# Patient Record
Sex: Female | Born: 1995 | Race: Black or African American | Hispanic: No | Marital: Single | Smoking: Former smoker
Health system: Southern US, Community
[De-identification: ages and names within clinical notes are randomized; demographics above are authoritative.]

## PROBLEM LIST (undated history)

## (undated) ENCOUNTER — Inpatient Hospital Stay (HOSPITAL_COMMUNITY): Payer: Self-pay

## (undated) DIAGNOSIS — U071 COVID-19: Secondary | ICD-10-CM

## (undated) DIAGNOSIS — D649 Anemia, unspecified: Secondary | ICD-10-CM

## (undated) DIAGNOSIS — O0990 Supervision of high risk pregnancy, unspecified, unspecified trimester: Secondary | ICD-10-CM

## (undated) DIAGNOSIS — O34219 Maternal care for unspecified type scar from previous cesarean delivery: Secondary | ICD-10-CM

## (undated) DIAGNOSIS — R0600 Dyspnea, unspecified: Secondary | ICD-10-CM

## (undated) DIAGNOSIS — O30049 Twin pregnancy, dichorionic/diamniotic, unspecified trimester: Secondary | ICD-10-CM

## (undated) DIAGNOSIS — I959 Hypotension, unspecified: Secondary | ICD-10-CM

## (undated) HISTORY — PX: OTHER SURGICAL HISTORY: SHX169

## (undated) HISTORY — DX: Dyspnea, unspecified: R06.00

---
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
## 2015-05-10 ENCOUNTER — Ambulatory Visit (HOSPITAL_COMMUNITY)
Admission: EM | Admit: 2015-05-10 | Discharge: 2015-05-10 | Disposition: A | Discharge status: Home / Self Care | Payer: Medicaid Other | Attending: Emergency Medicine | Admitting: Emergency Medicine

## 2015-05-10 DIAGNOSIS — L292 Pruritus vulvae: Secondary | ICD-10-CM | POA: Insufficient documentation

## 2015-05-10 DIAGNOSIS — F17210 Nicotine dependence, cigarettes, uncomplicated: Secondary | ICD-10-CM | POA: Insufficient documentation

## 2015-05-10 DIAGNOSIS — Z79899 Other long term (current) drug therapy: Secondary | ICD-10-CM | POA: Diagnosis not present

## 2015-05-10 DIAGNOSIS — Z113 Encounter for screening for infections with a predominantly sexual mode of transmission: Secondary | ICD-10-CM

## 2015-05-10 DIAGNOSIS — N898 Other specified noninflammatory disorders of vagina: Secondary | ICD-10-CM

## 2015-05-10 DIAGNOSIS — Z9889 Other specified postprocedural states: Secondary | ICD-10-CM | POA: Insufficient documentation

## 2015-05-10 DIAGNOSIS — L298 Other pruritus: Secondary | ICD-10-CM

## 2015-05-10 LAB — POCT URINALYSIS DIP (DEVICE)
BILIRUBIN URINE: NEGATIVE
Glucose, UA: NEGATIVE mg/dL
Hgb urine dipstick: NEGATIVE
KETONES UR: NEGATIVE mg/dL
Leukocytes, UA: NEGATIVE
Nitrite: NEGATIVE
PH: 6 (ref 5.0–8.0)
Protein, ur: NEGATIVE mg/dL
Specific Gravity, Urine: 1.025 (ref 1.005–1.030)
Urobilinogen, UA: 0.2 mg/dL (ref 0.0–1.0)

## 2015-05-10 LAB — POCT PREGNANCY, URINE: PREG TEST UR: NEGATIVE

## 2015-05-10 MED ORDER — FLUCONAZOLE 150 MG PO TABS: 150.0000 mg | ORAL_TABLET | Freq: Once | ORAL | Status: DC

## 2015-05-10 NOTE — ED Notes (Signed)
Pt recently had unprotected sex with a past sex partner and she has had symptoms in the past and thinks that she has some of the same Pt alert and oriented

## 2015-05-10 NOTE — Discharge Instructions (Signed)
It looks like you have a yeast infection. Take Diflucan one pill today. If you are still having itching in 3 days, take the second pill. We have collected testing for STDs. We will call you with your results in 2-3 days. Follow-up as needed.

## 2015-05-10 NOTE — ED Provider Notes (Signed)
CSN: [id number]     Arrival date & time 05/10/15  1523 History   First MD Initiated Contact with Patient 05/10/15 1635     Chief Complaint  Patient presents with  . Vaginal Itching    and odor   (Consider location/radiation/quality/duration/timing/severity/associated sxs/prior Treatment) HPI  She is a 20 year old woman here for evaluation of vaginal itching. She states over the last few days she has noticed a change in her vaginal odor as well as some mild itching. She also reports some very slight abdominal pain. No urinary symptoms. She does report unprotected sex with a previous partner recently. She did have gonorrhea last year and states her symptoms are similar, but much more mild.  No past medical history on file. Past Surgical History  Procedure Laterality Date  . Cesarean section     No family history on file. Social History  Substance Use Topics  . Smoking status: Current Every Day Smoker    Types: Cigarettes  . Smokeless tobacco: Not on file  . Alcohol Use: No   OB History    No data available     Review of Systems As in history of present illness Allergies  Review of patient's allergies indicates no known allergies.  Home Medications   Prior to Admission medications   Medication Sig Start Date End Date Taking? Authorizing Provider  fluconazole (DIFLUCAN) 150 MG tablet Take 1 tablet (150 mg total) by mouth once. Take 2nd pill if symptoms not completely resolved in 3 days. 05/10/15   Charm RingsErin J Jaicee Michelotti, MD  ibuprofen (ADVIL,MOTRIN) 600 MG tablet Take 1 tablet (600 mg total) by mouth every 8 (eight) hours as needed. 12/03/14   Shon Batonourtney F Horton, MD  ondansetron (ZOFRAN ODT) 8 MG disintegrating tablet Take 1 tablet (8 mg total) by mouth every 8 (eight) hours as needed for nausea or vomiting. 12/03/14   Shon Batonourtney F Horton, MD   Meds Ordered and Administered this Visit  Medications - No data to display  BP 115/53 mmHg  Pulse 61  Temp(Src) 99.3 F (37.4 C) (Oral)  Resp 16   SpO2 100%  LMP 04/21/2015 (Exact Date) No data found.   Physical Exam  Constitutional: She is oriented to person, place, and time. She appears well-developed and well-nourished. No distress.  Cardiovascular: Normal rate.   Pulmonary/Chest: Effort normal.  Genitourinary: There is no rash on the right labia. There is no rash on the left labia. Cervix exhibits no motion tenderness, no discharge and no friability. No bleeding in the vagina. No foreign body around the vagina. Vaginal discharge (clumpy white) found.  Neurological: She is alert and oriented to person, place, and time.    ED Course  Procedures (including critical care time)  Labs Review Labs Reviewed  POCT URINALYSIS DIP (DEVICE)  POCT PREGNANCY, URINE  CERVICOVAGINAL ANCILLARY ONLY    Imaging Review No results found.    MDM   1. Vaginal itching   2. Screen for STD (sexually transmitted disease)    We'll treat for yeast infection. Swabs collected for STD testing. Will treat based on results. Follow-up as needed.    Charm RingsErin J Deaven Urwin, MD 05/10/15 1710

## 2015-05-11 LAB — CERVICOVAGINAL ANCILLARY ONLY
Chlamydia: NEGATIVE
NEISSERIA GONORRHEA: POSITIVE — AB
Wet Prep (BD Affirm): POSITIVE — AB

## 2015-05-12 ENCOUNTER — Ambulatory Visit (HOSPITAL_COMMUNITY)
Admission: EM | Admit: 2015-05-12 | Discharge: 2015-05-12 | Disposition: A | Discharge status: Nursing Home (Includes ICF) | Payer: Medicaid Other

## 2015-05-12 ENCOUNTER — Telehealth (HOSPITAL_COMMUNITY): Payer: Self-pay | Admitting: Emergency Medicine

## 2015-05-12 NOTE — ED Notes (Addendum)
Called pt and notified of recent lab results from visit 4/10 Pt ID'd properly... [facility]  Per Dr. Dayton ScrapeMurray,  Clinical staff, please let patient and health department know that test for gonorrhea was positive. Will need tx with 250mg  IM rocephin and 1g po zithromax. Test for gardnerella (bacterial vaginosis) was also positive; can treat this with metronidazole 500mg  bid x 7d, #14 no refills. LM    Adv her that her partner needs to be tested and treated as well.  Pt verb understanding Education on safe sex given

## 2015-05-12 NOTE — ED Notes (Signed)
Pt  Told   Clerical  Saff  At  1415   That  She  Could  Wait  No  Longer    And   She  walked  Out the  door

## 2015-05-13 ENCOUNTER — Ambulatory Visit (HOSPITAL_COMMUNITY)
Admission: EM | Admit: 2015-05-13 | Discharge: 2015-05-13 | Disposition: A | Discharge status: Home / Self Care | Payer: Medicaid Other | Attending: Family Medicine | Admitting: Family Medicine

## 2015-05-13 ENCOUNTER — Encounter (HOSPITAL_COMMUNITY): Payer: Self-pay | Admitting: *Deleted

## 2015-05-13 DIAGNOSIS — A64 Unspecified sexually transmitted disease: Secondary | ICD-10-CM

## 2015-05-13 MED ORDER — CEFTRIAXONE SODIUM 250 MG IJ SOLR
INTRAMUSCULAR | Status: AC
  Filled 2015-05-13: qty 250

## 2015-05-13 MED ORDER — CEFTRIAXONE SODIUM 250 MG IJ SOLR
250.0000 mg | Freq: Once | INTRAMUSCULAR | Status: AC
  Administered 2015-05-13: 250 mg via INTRAMUSCULAR

## 2015-05-13 MED ORDER — AZITHROMYCIN 250 MG PO TABS
1000.0000 mg | ORAL_TABLET | Freq: Once | ORAL | Status: AC
  Administered 2015-05-13: 1000 mg via ORAL

## 2015-05-13 MED ORDER — METRONIDAZOLE 500 MG PO TABS: 500.0000 mg | ORAL_TABLET | Freq: Two times a day (BID) | ORAL | Status: DC

## 2015-05-13 NOTE — ED Notes (Addendum)
Pt  Was   Seen       3  Days  ago  For  Std  Pt  Here  Now  For  tx       She  Was  Given   Yeast  meds          And  Was  Told had  Poss  gc  And  gardenella    Pt  Here  For possible injection  Of   rocephen     z max   And  Diflucan

## 2015-05-13 NOTE — ED Provider Notes (Addendum)
CSN: [id number]     Arrival date & time 05/13/15  1419 History   First MD Initiated Contact with Sarah Schmitt 05/13/15 1550     Chief Complaint  Sarah Schmitt presents with  . SEXUALLY TRANSMITTED DISEASE   (Consider location/radiation/quality/duration/timing/severity/associated sxs/prior Treatment) Sarah Schmitt is a 20 y.o. female presenting with STD exposure. The history is provided by the Sarah Schmitt.  Exposure to STD This is a new problem. The current episode started more than 2 days ago (seen 4/10 and pos for gc and bv, here for rx, pt denies any sx.). The problem has not changed since onset.Pertinent negatives include no abdominal pain.    History reviewed. No pertinent past medical history. Past Surgical History  Procedure Laterality Date  . Cesarean section     History reviewed. No pertinent family history. Social History  Substance Use Topics  . Smoking status: Current Every Day Smoker    Types: Cigarettes  . Smokeless tobacco: None  . Alcohol Use: No   OB History    No data available     Review of Systems  Gastrointestinal: Negative for abdominal pain.  Genitourinary: Negative.   All other systems reviewed and are negative.   Allergies  Review of Sarah Schmitt's allergies indicates no known allergies.  Home Medications   Prior to Admission medications   Medication Sig Start Date End Date Taking? Authorizing Provider  fluconazole (DIFLUCAN) 150 MG tablet Take 1 tablet (150 mg total) by mouth once. Take 2nd pill if symptoms not completely resolved in 3 days. 05/10/15   Charm Rings, MD  ibuprofen (ADVIL,MOTRIN) 600 MG tablet Take 1 tablet (600 mg total) by mouth every 8 (eight) hours as needed. 12/03/14   Shon Baton, MD  metroNIDAZOLE (FLAGYL) 500 MG tablet Take 1 tablet (500 mg total) by mouth 2 (two) times daily. 05/13/15   Linna Hoff, MD  ondansetron (ZOFRAN ODT) 8 MG disintegrating tablet Take 1 tablet (8 mg total) by mouth every 8 (eight) hours as needed for nausea or  vomiting. 12/03/14   Shon Baton, MD   Meds Ordered and Administered this Visit   Medications  cefTRIAXone (ROCEPHIN) injection 250 mg (not administered)  azithromycin (ZITHROMAX) tablet 1,000 mg (not administered)    BP 104/68 mmHg  Pulse 60  Temp(Src) 97.7 F (36.5 C) (Oral)  Resp 16  SpO2 100%  LMP 05/13/2015 No data found.   Physical Exam  Constitutional: She is oriented to person, place, and time. She appears well-developed and well-nourished. No distress.  Abdominal: There is no tenderness.  Neurological: She is alert and oriented to person, place, and time.  Skin: Skin is warm and dry.  Nursing note and vitals reviewed.   ED Course  Procedures (including critical care time)  Labs Review Labs Reviewed - No data to display  Imaging Review No results found.   Visual Acuity Review  Right Eye Distance:   Left Eye Distance:   Bilateral Distance:    Right Eye Near:   Left Eye Near:    Bilateral Near:         MDM   1. STD (sexually transmitted disease)    Meds ordered this encounter  Medications  . cefTRIAXone (ROCEPHIN) injection 250 mg    Sig:   . azithromycin (ZITHROMAX) tablet 1,000 mg    Sig:   . metroNIDAZOLE (FLAGYL) 500 MG tablet    Sig: Take 1 tablet (500 mg total) by mouth 2 (two) times daily.    Dispense:  14 tablet  Refill:  0       Linna HoffJames D Kindl, MD 05/13/15 1615  Linna HoffJames D Kindl, MD 05/13/15 [phone]

## 2015-05-13 NOTE — ED Notes (Signed)
Called pt and notified her that it's important for her to come in and get seen ASAP... Adv her that [facility] will be notified as well Reports she left yest b/c she had to go to work and will be in this afternoon [facility] is [facility].

## 2015-05-17 NOTE — ED Notes (Signed)
Pt came in to get treated on 4/13

## 2016-01-31 HISTORY — DX: Maternal care for unspecified type scar from previous cesarean delivery: O34.219

## 2016-03-08 ENCOUNTER — Encounter (HOSPITAL_COMMUNITY): Payer: Self-pay | Admitting: *Deleted

## 2016-03-08 ENCOUNTER — Emergency Department (HOSPITAL_COMMUNITY): Payer: Medicaid Other

## 2016-03-08 ENCOUNTER — Emergency Department (HOSPITAL_COMMUNITY)
Admission: EM | Admit: 2016-03-08 | Discharge: 2016-03-08 | Disposition: A | Discharge status: Home / Self Care | Payer: Medicaid Other | Attending: Emergency Medicine | Admitting: Emergency Medicine

## 2016-03-08 DIAGNOSIS — Z3A15 15 weeks gestation of pregnancy: Secondary | ICD-10-CM

## 2016-03-08 DIAGNOSIS — O99332 Smoking (tobacco) complicating pregnancy, second trimester: Secondary | ICD-10-CM | POA: Diagnosis not present

## 2016-03-08 DIAGNOSIS — O209 Hemorrhage in early pregnancy, unspecified: Secondary | ICD-10-CM | POA: Insufficient documentation

## 2016-03-08 DIAGNOSIS — R109 Unspecified abdominal pain: Secondary | ICD-10-CM | POA: Diagnosis not present

## 2016-03-08 DIAGNOSIS — O9A212 Injury, poisoning and certain other consequences of external causes complicating pregnancy, second trimester: Secondary | ICD-10-CM | POA: Insufficient documentation

## 2016-03-08 DIAGNOSIS — F17210 Nicotine dependence, cigarettes, uncomplicated: Secondary | ICD-10-CM | POA: Insufficient documentation

## 2016-03-08 DIAGNOSIS — S3991XA Unspecified injury of abdomen, initial encounter: Secondary | ICD-10-CM

## 2016-03-08 LAB — CBC WITH DIFFERENTIAL/PLATELET
Basophils Absolute: 0 10*3/uL (ref 0.0–0.1)
Basophils Relative: 0 %
EOS PCT: 1 %
Eosinophils Absolute: 0.1 10*3/uL (ref 0.0–0.7)
HEMATOCRIT: 31.9 % — AB (ref 36.0–46.0)
HEMOGLOBIN: 10.7 g/dL — AB (ref 12.0–15.0)
LYMPHS ABS: 3.9 10*3/uL (ref 0.7–4.0)
LYMPHS PCT: 44 %
MCH: 29.4 pg (ref 26.0–34.0)
MCHC: 33.5 g/dL (ref 30.0–36.0)
MCV: 87.6 fL (ref 78.0–100.0)
Monocytes Absolute: 0.5 10*3/uL (ref 0.1–1.0)
Monocytes Relative: 6 %
NEUTROS ABS: 4.4 10*3/uL (ref 1.7–7.7)
Neutrophils Relative %: 49 %
Platelets: 234 10*3/uL (ref 150–400)
RBC: 3.64 MIL/uL — ABNORMAL LOW (ref 3.87–5.11)
RDW: 14.7 % (ref 11.5–15.5)
WBC: 9 10*3/uL (ref 4.0–10.5)

## 2016-03-08 LAB — URINALYSIS, ROUTINE W REFLEX MICROSCOPIC
Bilirubin Urine: NEGATIVE
GLUCOSE, UA: NEGATIVE mg/dL
HGB URINE DIPSTICK: NEGATIVE
Ketones, ur: NEGATIVE mg/dL
LEUKOCYTES UA: NEGATIVE
Nitrite: NEGATIVE
Protein, ur: NEGATIVE mg/dL
SPECIFIC GRAVITY, URINE: 1.024 (ref 1.005–1.030)
pH: 6 (ref 5.0–8.0)

## 2016-03-08 LAB — BASIC METABOLIC PANEL
Anion gap: 9 (ref 5–15)
BUN: 6 mg/dL (ref 6–20)
CHLORIDE: 102 mmol/L (ref 101–111)
CO2: 22 mmol/L (ref 22–32)
Calcium: 9 mg/dL (ref 8.9–10.3)
Creatinine, Ser: 0.47 mg/dL (ref 0.44–1.00)
GFR calc Af Amer: >60 mL/min (ref >60)
GFR calc non Af Amer: >60 mL/min (ref >60)
GLUCOSE: 84 mg/dL (ref 65–99)
Potassium: 3.5 mmol/L (ref 3.5–5.1)
Sodium: 133 mmol/L — ABNORMAL LOW (ref 135–145)

## 2016-03-08 LAB — POC URINE PREG, ED: Preg Test, Ur: POSITIVE — AB
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
## 2016-03-08 NOTE — Discharge Instructions (Signed)
Please follow-up with her OB/GYN for further management of your pregnancy and for recheck of your abdominal injury. If you been having any worsening vaginal bleeding, or changes in symptoms, please return to the nearest emergency department.
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
## 2016-03-10 LAB — URINE CULTURE

## 2016-03-11 ENCOUNTER — Telehealth: Payer: Self-pay

## 2016-03-11 NOTE — Telephone Encounter (Addendum)
Post ED Visit - Positive Culture Follow-up: Successful Patient Follow-Up  Culture assessed and recommendations reviewed by: []  Enzo BiNathan Batchelder, Pharm.D. []  Celedonio MiyamotoJeremy Frens, Pharm.D., BCPS []  Garvin FilaMike Maccia, Pharm.D. []  Georgina PillionElizabeth Martin, [address]Ph[address] BCPS []  Leisure KnollMinh Pham, [address]Ph[address]D., BCPS, AAHIVP []  Estella HuskMichelle Turner, Pharm.D., BCPS, AAHIVP []  Tennis Mustassie Stewart, Pharm.D. []  Sherle Poeob Vincent, [address] Rachel Rumbarger Pharm D Positive urine culture  [x]  Patient discharged without antimicrobial prescription and treatment is now indicated []  Organism is resistant to prescribed ED discharge antimicrobial []  Patient with positive blood cultures  Changes discussed with ED provider: Fayrene HelperBowie Tran [facility]  New antibiotic prescription Amoxicillin 500 mg PO BID x 7 days [facility] [address] [phone]  Contacted patient, date 03/11/16 time 1151   CullomLinnell Fulling, Annmarie Plemmons Burnett 03/11/2016, 11:48 AM

## 2016-03-11 NOTE — Progress Notes (Signed)
ED Antimicrobial Stewardship Positive Culture Follow Up   Sarah Schmitt is an 21 y.o. female who [facility] with a chief complaint of  Chief Complaint  Patient presents with  . Abdominal Pain    Recent Results (from the past 720 hour(s))  Urine culture     Status: Abnormal   Collection Time: 03/08/16  7:36 PM  Result Value Ref Range Status   Specimen Description URINE, CLEAN CATCH  Final   Special Requests NONE  Final   Culture >=100,000 COLONIES/mL ENTEROCOCCUS FAECALIS (A)  Final   Report Status 03/10/2016 FINAL  Final   Organism ID, Bacteria ENTEROCOCCUS FAECALIS (A)  Final      Susceptibility   Enterococcus faecalis - MIC*    AMPICILLIN <=2 SENSITIVE Sensitive     LEVOFLOXACIN 1 SENSITIVE Sensitive     NITROFURANTOIN <=16 SENSITIVE Sensitive     VANCOMYCIN 1 SENSITIVE Sensitive     * >=100,000 COLONIES/mL ENTEROCOCCUS FAECALIS    []  Treated with N/A, organism resistant to prescribed antimicrobial [x]  Patient discharged originally without antimicrobial agent and treatment is now indicated  New antibiotic prescription: amoxicillin 500mg  PO BID x 7 days  ED Provider: Fayrene HelperBowie Tran   Izreal Kock, Drake Leachachel Lynn 03/11/2016, 10:38 AM Clinical Pharmacist Phone# [phone]

## 2016-03-14 ENCOUNTER — Encounter (HOSPITAL_COMMUNITY): Payer: Self-pay | Admitting: *Deleted

## 2016-03-14 ENCOUNTER — Inpatient Hospital Stay (HOSPITAL_COMMUNITY)
Admission: AD | Admit: 2016-03-14 | Discharge: 2016-03-14 | Disposition: A | Discharge status: Home / Self Care | Payer: Medicaid Other | Source: Ambulatory Visit | Attending: Obstetrics & Gynecology | Admitting: Obstetrics & Gynecology

## 2016-03-14 DIAGNOSIS — N930 Postcoital and contact bleeding: Secondary | ICD-10-CM

## 2016-03-14 DIAGNOSIS — O34219 Maternal care for unspecified type scar from previous cesarean delivery: Secondary | ICD-10-CM

## 2016-03-14 DIAGNOSIS — O26852 Spotting complicating pregnancy, second trimester: Secondary | ICD-10-CM | POA: Diagnosis not present

## 2016-03-14 DIAGNOSIS — Z87891 Personal history of nicotine dependence: Secondary | ICD-10-CM | POA: Insufficient documentation

## 2016-03-14 DIAGNOSIS — R109 Unspecified abdominal pain: Secondary | ICD-10-CM | POA: Diagnosis present

## 2016-03-14 DIAGNOSIS — Z3A16 16 weeks gestation of pregnancy: Secondary | ICD-10-CM | POA: Insufficient documentation

## 2016-03-14 DIAGNOSIS — O4692 Antepartum hemorrhage, unspecified, second trimester: Secondary | ICD-10-CM | POA: Diagnosis not present

## 2016-03-14 LAB — URINALYSIS, ROUTINE W REFLEX MICROSCOPIC
BILIRUBIN URINE: NEGATIVE
GLUCOSE, UA: NEGATIVE mg/dL
Hgb urine dipstick: NEGATIVE
KETONES UR: NEGATIVE mg/dL
LEUKOCYTES UA: NEGATIVE
NITRITE: NEGATIVE
PH: 6 (ref 5.0–8.0)
Protein, ur: NEGATIVE mg/dL
SPECIFIC GRAVITY, URINE: 1.021 (ref 1.005–1.030)

## 2016-03-14 LAB — WET PREP, GENITAL
CLUE CELLS WET PREP: NONE SEEN
SPERM: NONE SEEN
TRICH WET PREP: NONE SEEN
WBC, Wet Prep HPF POC: NONE SEEN
Yeast Wet Prep HPF POC: NONE SEEN

## 2016-03-14 NOTE — Progress Notes (Signed)
Marie Williams CNM in to discuss test results and d/c plan. Written and verbal d/c instructions given and understanding voiced.  
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
## 2016-03-14 NOTE — MAU Note (Signed)
Have had cramping my whole pregnancy. Spotting  last wk and [facility]. Told me everything ok but if cont to be seen. Having a lot of pressure on c/s incision for a wk. Spotting was bright red this am and is the second time of spotting so that is why I came in

## 2016-03-14 NOTE — Discharge Instructions (Signed)
Second Trimester of Pregnancy The second trimester is from week 13 through week 28, month 4 through 6. This is often the time in pregnancy that you feel your best. Often times, morning sickness has lessened or quit. You may have more energy, and you may get hungry more often. Your unborn baby (fetus) is growing rapidly. At the end of the sixth month, he or she is about 9 inches long and weighs about 1 pounds. You will likely feel the baby move (quickening) between 18 and 20 weeks of pregnancy. Follow these instructions at home:  Avoid all smoking, herbs, and alcohol. Avoid drugs not approved by your doctor.  Do not use any tobacco products, including cigarettes, chewing tobacco, and electronic cigarettes. If you need help quitting, ask your doctor. You may get counseling or other support to help you quit.  Only take medicine as told by your doctor. Some medicines are safe and some are not during pregnancy.  Exercise only as told by your doctor. Stop exercising if you start having cramps.  Eat regular, healthy meals.  Wear a good support bra if your breasts are tender.  Do not use hot tubs, steam rooms, or saunas.  Wear your seat belt when driving.  Avoid raw meat, uncooked cheese, and liter boxes and soil used by cats.  Take your prenatal vitamins.  Take 1500-2000 milligrams of calcium daily starting at the 20th week of pregnancy until you deliver your baby.  Try taking medicine that helps you poop (stool softener) as needed, and if your doctor approves. Eat more fiber by eating fresh fruit, vegetables, and whole grains. Drink enough fluids to keep your pee (urine) clear or pale yellow.  Take warm water baths (sitz baths) to soothe pain or discomfort caused by hemorrhoids. Use hemorrhoid cream if your doctor approves.  If you have puffy, bulging veins (varicose veins), wear support hose. Raise (elevate) your feet for 15 minutes, 3-4 times a day. Limit salt in your diet.  Avoid heavy  lifting, wear low heals, and sit up straight.  Rest with your legs raised if you have leg cramps or low back pain.  Visit your dentist if you have not gone during your pregnancy. Use a soft toothbrush to brush your teeth. Be gentle when you floss.  You can have sex (intercourse) unless your doctor tells you not to.  Go to your doctor visits. Get help if:  You feel dizzy.  You have mild cramps or pressure in your lower belly (abdomen).  You have a nagging pain in your belly area.  You continue to feel sick to your stomach (nauseous), throw up (vomit), or have watery poop (diarrhea).  You have bad smelling fluid coming from your vagina.  You have pain with peeing (urination). Get help right away if:  You have a fever.  You are leaking fluid from your vagina.  You have spotting or bleeding from your vagina.  You have severe belly cramping or pain.  You lose or gain weight rapidly.  You have trouble catching your breath and have chest pain.  You notice sudden or extreme puffiness (swelling) of your face, hands, ankles, feet, or legs.  You have not felt the baby move in over an hour.  You have severe headaches that do not go away with medicine.  You have vision changes. This information is not intended to replace advice given to you by your health care provider. Make sure you discuss any questions you have with your health care  provider. °Document Released: 04/12/2009 Document Revised: 06/24/2015 Document Reviewed: 03/19/2012 °Elsevier Interactive Patient Education © 2017 Elsevier Inc. ° °Prenatal Care Providers °[facility]    [facility] ° Phone- [phone]     Phone: [phone] °         °[facility]                      [facility] ° @[place]     Phone: [phone] ° Phone: [phone] °        [facility] °[facility]     Phone: [phone] ° Phone:  [phone]   °        [facility] °[facility] [place]                hone: [phone] ° Phone: [phone] °        [facility] °Dr. Bernard Marshall      Phone: [phone] ° Phone: [phone] °        [facility] °[facility]                Phone: [phone] ° Women’s Health  ° Phone:[phone]    [facility] °         Phone: [phone] °[facility] °  Phone: [phone] ° °

## 2016-03-15 LAB — GC/CHLAMYDIA PROBE AMP (~~LOC~~) NOT AT ARMC
CHLAMYDIA, DNA PROBE: NEGATIVE
NEISSERIA GONORRHEA: NEGATIVE

## 2016-03-23 ENCOUNTER — Inpatient Hospital Stay (HOSPITAL_COMMUNITY)
Admission: AD | Admit: 2016-03-23 | Discharge: 2016-03-23 | Disposition: A | Discharge status: Home / Self Care | Payer: Medicaid Other | Source: Ambulatory Visit | Attending: Obstetrics and Gynecology | Admitting: Obstetrics and Gynecology

## 2016-03-23 ENCOUNTER — Encounter (HOSPITAL_COMMUNITY): Payer: Self-pay | Admitting: *Deleted

## 2016-03-23 DIAGNOSIS — Z87891 Personal history of nicotine dependence: Secondary | ICD-10-CM | POA: Insufficient documentation

## 2016-03-23 DIAGNOSIS — Z3A17 17 weeks gestation of pregnancy: Secondary | ICD-10-CM | POA: Insufficient documentation

## 2016-03-23 DIAGNOSIS — Y92009 Unspecified place in unspecified non-institutional (private) residence as the place of occurrence of the external cause: Secondary | ICD-10-CM

## 2016-03-23 DIAGNOSIS — O34219 Maternal care for unspecified type scar from previous cesarean delivery: Secondary | ICD-10-CM

## 2016-03-23 DIAGNOSIS — O4692 Antepartum hemorrhage, unspecified, second trimester: Secondary | ICD-10-CM

## 2016-03-23 DIAGNOSIS — O26892 Other specified pregnancy related conditions, second trimester: Secondary | ICD-10-CM | POA: Insufficient documentation

## 2016-03-23 DIAGNOSIS — O9989 Other specified diseases and conditions complicating pregnancy, childbirth and the puerperium: Secondary | ICD-10-CM

## 2016-03-23 MED ORDER — PREPLUS 27-1 MG PO TABS: 1.0000 | ORAL_TABLET | Freq: Every day | ORAL | 13 refills | Status: DC
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
## 2016-04-03 ENCOUNTER — Encounter: Payer: Self-pay | Admitting: Obstetrics

## 2016-04-03 ENCOUNTER — Ambulatory Visit (INDEPENDENT_AMBULATORY_CARE_PROVIDER_SITE_OTHER): Payer: Medicaid Other | Admitting: Obstetrics

## 2016-04-03 VITALS — BP 123/74 | HR 77 | Wt 191.0 lb

## 2016-04-03 DIAGNOSIS — O0932 Supervision of pregnancy with insufficient antenatal care, second trimester: Secondary | ICD-10-CM

## 2016-04-03 DIAGNOSIS — Z3402 Encounter for supervision of normal first pregnancy, second trimester: Secondary | ICD-10-CM | POA: Diagnosis not present

## 2016-04-03 DIAGNOSIS — Z3490 Encounter for supervision of normal pregnancy, unspecified, unspecified trimester: Secondary | ICD-10-CM | POA: Insufficient documentation

## 2016-04-03 DIAGNOSIS — O4692 Antepartum hemorrhage, unspecified, second trimester: Secondary | ICD-10-CM

## 2016-04-03 DIAGNOSIS — Z87440 Personal history of urinary (tract) infections: Secondary | ICD-10-CM

## 2016-04-03 DIAGNOSIS — O34219 Maternal care for unspecified type scar from previous cesarean delivery: Secondary | ICD-10-CM

## 2016-04-03 DIAGNOSIS — O0930 Supervision of pregnancy with insufficient antenatal care, unspecified trimester: Secondary | ICD-10-CM | POA: Insufficient documentation

## 2016-04-03 DIAGNOSIS — Z3480 Encounter for supervision of other normal pregnancy, unspecified trimester: Secondary | ICD-10-CM

## 2016-04-03 NOTE — Progress Notes (Signed)
Subjective:  Sarah Schmitt is a 21 y.o. G2P1001 at 3849w1d being seen today for ongoing prenatal care.  She is currently monitored for the following issues for this low-risk pregnancy and has History of cesarean delivery affecting pregnancy; Antepartum bleeding, second trimester; Encounter for supervision of normal pregnancy; and Late prenatal care on her problem list.  Patient reports urinary difficulty completely emptying bladder.  Contractions: Not present. Vag. Bleeding: None.  Movement: Absent. Denies leaking of fluid.   The following portions of the patient's history were reviewed and updated as appropriate: allergies, current medications, past family history, past medical history, past social history, past surgical history and problem list. Problem list updated.  Objective:   Vitals:   04/03/16 0957  BP: 123/74  Pulse: 77  Weight: 191 lb (86.6 kg)    Fetal Status:     Movement: Absent     General:  Alert, oriented and cooperative. Patient is in no acute distress.  Skin: Skin is warm and dry. No rash noted.   Cardiovascular: Normal heart rate noted  Respiratory: Normal respiratory effort, no problems with respiration noted  Abdomen: Soft, gravid, appropriate for gestational age. Pain/Pressure: Present     Pelvic:  Cervical exam deferred        Extremities: Normal range of motion.  Edema: None  Mental Status: Normal mood and affect. Normal behavior. Normal judgment and thought content.   Urinalysis:      Assessment and Plan:  Pregnancy: G2P1001 at 6149w1d  1. History of cesarean delivery affecting pregnancy - Desires TOLAC  2. Antepartum bleeding, second trimester Stable.  3. History of UTI Rx: - Culture, OB Urine  4. Encounter for supervision of normal pregnancy, antepartum, unspecified gravidity   5. Late prenatal care   Preterm labor symptoms and general obstetric precautions including but not limited to vaginal bleeding, contractions, leaking of fluid and  fetal movement were reviewed in detail with the patient. Please refer to After Visit Summary for other counseling recommendations.  No Follow-up on file.   Brock Badharles A Harper, MDPatient ID: Sarah Schmitt, female   DOB: 09/25/1995, 21 y.o.   MRN: [id number]

## 2016-04-03 NOTE — Progress Notes (Signed)
Pt presents for NOB visit. Pt [facility] [place] and records were requested. Hx UTI, Abx complete, TOC UTI today.

## 2016-04-09 ENCOUNTER — Other Ambulatory Visit: Payer: Self-pay | Admitting: Obstetrics

## 2016-04-09 DIAGNOSIS — N3000 Acute cystitis without hematuria: Secondary | ICD-10-CM

## 2016-04-09 LAB — URINE CULTURE, OB REFLEX

## 2016-04-09 LAB — CULTURE, OB URINE

## 2016-04-09 MED ORDER — CEFUROXIME AXETIL 500 MG PO TABS: 500.0000 mg | ORAL_TABLET | Freq: Two times a day (BID) | ORAL | 1 refills | Status: DC

## 2016-04-13 ENCOUNTER — Other Ambulatory Visit: Payer: Self-pay | Admitting: Obstetrics

## 2016-04-13 DIAGNOSIS — N3000 Acute cystitis without hematuria: Secondary | ICD-10-CM

## 2016-04-13 MED ORDER — CEFUROXIME AXETIL 500 MG PO TABS: 500.0000 mg | ORAL_TABLET | Freq: Two times a day (BID) | ORAL | 1 refills | Status: DC

## 2016-05-02 ENCOUNTER — Encounter: Payer: Medicaid Other | Admitting: Obstetrics

## 2016-05-15 ENCOUNTER — Encounter: Payer: Medicaid Other | Admitting: Obstetrics

## 2016-05-24 ENCOUNTER — Encounter: Payer: Medicaid Other | Admitting: Obstetrics

## 2016-05-26 ENCOUNTER — Encounter (HOSPITAL_COMMUNITY): Payer: Self-pay

## 2016-05-26 ENCOUNTER — Inpatient Hospital Stay (HOSPITAL_COMMUNITY)
Admission: AD | Admit: 2016-05-26 | Discharge: 2016-05-26 | Disposition: A | Discharge status: Home / Self Care | Payer: Medicaid Other | Source: Ambulatory Visit | Attending: Obstetrics and Gynecology | Admitting: Obstetrics and Gynecology

## 2016-05-26 DIAGNOSIS — N760 Acute vaginitis: Secondary | ICD-10-CM

## 2016-05-26 DIAGNOSIS — O26892 Other specified pregnancy related conditions, second trimester: Secondary | ICD-10-CM | POA: Diagnosis present

## 2016-05-26 DIAGNOSIS — O9989 Other specified diseases and conditions complicating pregnancy, childbirth and the puerperium: Secondary | ICD-10-CM

## 2016-05-26 DIAGNOSIS — R1030 Lower abdominal pain, unspecified: Secondary | ICD-10-CM | POA: Diagnosis present

## 2016-05-26 DIAGNOSIS — Z3A26 26 weeks gestation of pregnancy: Secondary | ICD-10-CM | POA: Diagnosis not present

## 2016-05-26 DIAGNOSIS — B9689 Other specified bacterial agents as the cause of diseases classified elsewhere: Secondary | ICD-10-CM

## 2016-05-26 DIAGNOSIS — K5901 Slow transit constipation: Secondary | ICD-10-CM

## 2016-05-26 DIAGNOSIS — Z87891 Personal history of nicotine dependence: Secondary | ICD-10-CM | POA: Insufficient documentation

## 2016-05-26 DIAGNOSIS — R109 Unspecified abdominal pain: Secondary | ICD-10-CM | POA: Diagnosis not present

## 2016-05-26 LAB — URINALYSIS, ROUTINE W REFLEX MICROSCOPIC
Bilirubin Urine: NEGATIVE
GLUCOSE, UA: NEGATIVE mg/dL
Hgb urine dipstick: NEGATIVE
Ketones, ur: NEGATIVE mg/dL
LEUKOCYTES UA: NEGATIVE
Nitrite: NEGATIVE
PROTEIN: NEGATIVE mg/dL
Specific Gravity, Urine: 1.017 (ref 1.005–1.030)
pH: 8 (ref 5.0–8.0)

## 2016-05-26 LAB — WET PREP, GENITAL
Sperm: NONE SEEN
Trich, Wet Prep: NONE SEEN
YEAST WET PREP: NONE SEEN

## 2016-05-26 MED ORDER — METRONIDAZOLE 500 MG PO TABS: 500.0000 mg | ORAL_TABLET | Freq: Two times a day (BID) | ORAL | 0 refills | Status: DC

## 2016-05-26 MED ORDER — POLYETHYLENE GLYCOL 3350 17 GM/SCOOP PO POWD: 17.0000 g | Freq: Every day | ORAL | 0 refills | Status: DC

## 2016-05-26 MED ORDER — ACETAMINOPHEN 500 MG PO TABS
1000.0000 mg | ORAL_TABLET | Freq: Four times a day (QID) | ORAL | Status: DC | PRN
  Administered 2016-05-26: 1000 mg via ORAL
  Filled 2016-05-26: qty 2

## 2016-05-26 NOTE — Discharge Instructions (Signed)
Bacterial Vaginosis Bacterial vaginosis is an infection of the vagina. It happens when too many germs (bacteria) grow in the vagina. This infection puts you at risk for infections from sex (STIs). Treating this infection can lower your risk for some STIs. You should also treat this if you are pregnant. It can cause your baby to be born early. Follow these instructions at home: Medicines   Take over-the-counter and prescription medicines only as told by your doctor.  Take or use your antibiotic medicine as told by your doctor. Do not stop taking or using it even if you start to feel better. General instructions   If you your sexual partner is a woman, tell her that you have this infection. She needs to get treatment if she has symptoms. If you have a female partner, he does not need to be treated.  During treatment:  Avoid sex.  Do not douche.  Avoid alcohol as told.  Avoid breastfeeding as told.  Drink enough fluid to keep your pee (urine) clear or pale yellow.  Keep your vagina and butt (rectum) clean.  Wash the area with warm water every day.  Wipe from front to back after you use the toilet.  Keep all follow-up visits as told by your doctor. This is important. Preventing this condition   Do not douche.  Use only warm water to wash around your vagina.  Use protection when you have sex. This includes:  Latex condoms.  Dental dams.  Limit how many people you have sex with. It is best to only have sex with the same person (be monogamous).  Get tested for STIs. Have your partner get tested.  Wear underwear that is cotton or lined with cotton.  Avoid tight pants and pantyhose. This is most important in summer.  Do not use any products that have nicotine or tobacco in them. These include cigarettes and e-cigarettes. If you need help quitting, ask your doctor.  Do not use illegal drugs.  Limit how much alcohol you drink. Contact a doctor if:  Your symptoms do not  get better, even after you are treated.  You have more discharge or pain when you pee (urinate).  You have a fever.  You have pain in your belly (abdomen).  You have pain with sex.  Your bleed from your vagina between periods. Summary  This infection happens when too many germs (bacteria) grow in the vagina.  Treating this condition can lower your risk for some infections from sex (STIs).  You should also treat this if you are pregnant. It can cause early (premature) birth.  Do not stop taking or using your antibiotic medicine even if you start to feel better. This information is not intended to replace advice given to you by your health care provider. Make sure you discuss any questions you have with your health care provider. Document Released: 10/26/2007 Document Revised: 10/02/2015 Document Reviewed: 10/02/2015 Elsevier Interactive Patient Education  2017 Elsevier Inc.  Constipation, Adult Constipation is when a person:  Poops (has a bowel movement) fewer times in a week than normal.  Has a hard time pooping.  Has poop that is dry, hard, or bigger than normal. Follow these instructions at home: Eating and drinking    Eat foods that have a lot of fiber, such as:  Fresh fruits and vegetables.  Whole grains.  Beans.  Eat less of foods that are high in fat, low in fiber, or overly processed, such as:  [place] fries.  Hamburgers.  Cookies.  Candy.  Soda.  Drink enough fluid to keep your pee (urine) clear or pale yellow. General instructions   Exercise regularly or as told by your doctor.  Go to the restroom when you feel like you need to poop. Do not hold it in.  Take over-the-counter and prescription medicines only as told by your doctor. These include any fiber supplements.  Do pelvic floor retraining exercises, such as:  Doing deep breathing while relaxing your lower belly (abdomen).  Relaxing your pelvic floor while pooping.  Watch your  condition for any changes.  Keep all follow-up visits as told by your doctor. This is important. Contact a doctor if:  You have pain that gets worse.  You have a fever.  You have not pooped for 4 days.  You throw up (vomit).  You are not hungry.  You lose weight.  You are bleeding from the anus.  You have thin, pencil-like poop (stool). Get help right away if:  You have a fever, and your symptoms suddenly get worse.  You leak poop or have blood in your poop.  Your belly feels hard or bigger than normal (is bloated).  You have very bad belly pain.  You feel dizzy or you faint. This information is not intended to replace advice given to you by your health care provider. Make sure you discuss any questions you have with your health care provider. Document Released: 07/05/2007 Document Revised: 08/06/2015 Document Reviewed: 07/07/2015 Elsevier Interactive Patient Education  2017 [employer].

## 2016-05-26 NOTE — MAU Note (Signed)
Onset of lower abdominal cramping x 1 week, since last night kept her from sleeping and painful. Having pressure as well. Denies vaginal bleeding or LOF

## 2016-05-26 NOTE — MAU Provider Note (Signed)
History     CSN: [id number]  Arrival date and time: 05/26/16 1209   First Provider Initiated Contact with Patient 05/26/16 1228      Chief Complaint  Patient presents with  . Abdominal Cramping   G2P1001 .5 wks here with lower abdominal pain x1 week. Describes pain as intermittent and cramping. Pain starts at umbilicus and radiates to lower quadrants. Has not used anything for the pain. Denies vaginal discharge or bleeding. No urinary sx. Reports last BM was about 6 days ago. She reports good FM.    OB History    Gravida Para Term Preterm AB Living   0 1   SAB TAB Ectopic Multiple Live Births   0 0     1      Past Medical History:  Diagnosis Date  . Medical history non-contributory     Past Surgical History:  Procedure Laterality Date  . CESAREAN SECTION      Family History  Problem Relation Age of Onset  . Diabetes Father   . Hypertension Maternal Grandmother   . Diabetes Paternal Grandmother     Social History  Substance Use Topics  . Smoking status: Former Smoker    Types: Cigarettes  . Smokeless tobacco: Never Used  . Alcohol use No    Allergies: No Known Allergies  Prescriptions Prior to Admission  Medication Sig Dispense Refill Last Dose  . cefUROXime (CEFTIN) 500 MG tablet Take 1 tablet (500 mg total) by mouth 2 (two) times daily with a meal. 14 tablet 1   . Prenatal Vit-Fe Fumarate-FA (PREPLUS) 27-1 MG TABS Take 1 tablet by mouth daily. (Patient not taking: Reported on 04/03/2016) 30 tablet 13 Not Taking    Review of Systems  Constitutional: Negative for chills and fever.  Gastrointestinal: Positive for abdominal pain and constipation.  Genitourinary: Negative for difficulty urinating, dysuria, hematuria, urgency, vaginal bleeding and vaginal discharge.   Physical Exam   Blood pressure 124/60, pulse 76, temperature 98.1 F (36.7 C), temperature source Oral, resp. rate 16, last menstrual period 11/21/2015.  Physical Exam  Nursing  note and vitals reviewed. Constitutional: She is oriented to person, place, and time. She appears well-developed and well-nourished. No distress.  HENT:  Head: Normocephalic and atraumatic.  Cardiovascular: Normal rate.   Respiratory: Effort normal.  GI: Soft. She exhibits no distension and no mass. There is no tenderness. There is no rebound and no guarding.  gravid  Genitourinary:  Genitourinary Comments: External: no lesions or erythema Vagina: rugated, parous, thin white discharge, +malodor Cervix closed/long   Musculoskeletal: Normal range of motion.  Neurological: She is alert and oriented to person, place, and time.  Skin: Skin is warm and dry.  Psychiatric: She has a normal mood and affect.   Results for orders placed or performed during the hospital encounter of 05/26/16 (from the past 24 hour(s))  Urinalysis, Routine w reflex microscopic     Status: None   Collection Time: 05/26/16 12:15 PM  Result Value Ref Range   Color, Urine YELLOW YELLOW   APPearance CLEAR CLEAR   Specific Gravity, Urine 1.017 1.005 - 1.030   pH 8.0 5.0 - 8.0   Glucose, UA NEGATIVE NEGATIVE mg/dL   Hgb urine dipstick NEGATIVE NEGATIVE   Bilirubin Urine NEGATIVE NEGATIVE   Ketones, ur NEGATIVE NEGATIVE mg/dL   Protein, ur NEGATIVE NEGATIVE mg/dL   Nitrite NEGATIVE NEGATIVE   Leukocytes, UA NEGATIVE NEGATIVE  Wet prep, genital     Status:  Abnormal   Collection Time: 05/26/16 12:27 PM  Result Value Ref Range   Yeast Wet Prep HPF POC NONE SEEN NONE SEEN   Trich, Wet Prep NONE SEEN NONE SEEN   Clue Cells Wet Prep HPF POC PRESENT (A) NONE SEEN   WBC, Wet Prep HPF POC FEW (A) NONE SEEN   Sperm NONE SEEN    MAU Course  Procedures Tylenol 1 g po  MDM Labs ordered and reviewed. Pain improved after Tylenol. No evidence of UTI or PTL. Pain likely caused by constipation. Will treat BV. Stable for discharge home.  Assessment and Plan   1. [redacted] weeks gestation of pregnancy   2. Slow transit  constipation   3. Bacterial vaginosis    Discharge home Follow up at [facility] in 2 weeks Rx Miralax Rx Flagyl Increase water intake Increase dietary fiber PTL precautions  Allergies as of 05/26/2016   No Known Allergies     Medication List    STOP taking these medications   cefUROXime 500 MG tablet Commonly known as:  CEFTIN     TAKE these medications   metroNIDAZOLE 500 MG tablet Commonly known as:  FLAGYL Take 1 tablet (500 mg total) by mouth 2 (two) times daily.   polyethylene glycol powder powder Commonly known as:  GLYCOLAX/MIRALAX Take 17 g by mouth daily.   PREPLUS 27-1 MG Tabs Take 1 tablet by mouth daily.      Donette Larry, CNM 05/26/2016, 12:44 PM

## 2016-05-29 LAB — GC/CHLAMYDIA PROBE AMP (~~LOC~~) NOT AT ARMC
CHLAMYDIA, DNA PROBE: NEGATIVE
Neisseria Gonorrhea: NEGATIVE

## 2016-06-07 ENCOUNTER — Encounter: Payer: Medicaid Other | Admitting: Obstetrics & Gynecology

## 2016-06-13 ENCOUNTER — Ambulatory Visit (INDEPENDENT_AMBULATORY_CARE_PROVIDER_SITE_OTHER): Payer: Medicaid Other | Admitting: Certified Nurse Midwife

## 2016-06-13 VITALS — BP 124/70 | HR 87 | Wt 202.8 lb

## 2016-06-13 DIAGNOSIS — O34219 Maternal care for unspecified type scar from previous cesarean delivery: Secondary | ICD-10-CM | POA: Diagnosis not present

## 2016-06-13 DIAGNOSIS — Z3490 Encounter for supervision of normal pregnancy, unspecified, unspecified trimester: Secondary | ICD-10-CM

## 2016-06-13 DIAGNOSIS — Z3483 Encounter for supervision of other normal pregnancy, third trimester: Secondary | ICD-10-CM

## 2016-06-13 NOTE — Progress Notes (Signed)
Patient reports she is doing well- she does need a refill on her prenatals.

## 2016-06-14 NOTE — Progress Notes (Signed)
   PRENATAL VISIT NOTE  Subjective:  Sarah Schmitt is a 21 y.o. G2P1001 at 96w3dbeing seen today for ongoing prenatal care.  She is currently monitored for the following issues for this low-risk pregnancy and has History of cesarean delivery affecting pregnancy; Antepartum bleeding, second trimester; Encounter for supervision of normal pregnancy; and Late prenatal care on her problem list.  Patient reports no complaints.  Contractions: Not present. Vag. Bleeding: None.  Movement: Absent. Denies leaking of fluid.   The following portions of the patient's history were reviewed and updated as appropriate: allergies, current medications, past family history, past medical history, past social history, past surgical history and problem list. Problem list updated.  Objective:   Vitals:   06/13/16 1515  BP: 124/70  Pulse: 87  Weight: 202 lb 12.8 oz (92 kg)    Fetal Status: Fetal Heart Rate (bpm): NST; reactive Fundal Height: 29 cm Movement: Absent     General:  Alert, oriented and cooperative. Patient is in no acute distress.  Skin: Skin is warm and dry. No rash noted.   Cardiovascular: Normal heart rate noted  Respiratory: Normal respiratory effort, no problems with respiration noted  Abdomen: Soft, gravid, appropriate for gestational age. Pain/Pressure: Absent     Pelvic:  Cervical exam deferred        Extremities: Normal range of motion.  Edema: None  Mental Status: Normal mood and affect. Normal behavior. Normal judgment and thought content.  NST: + accels, no decels, moderate variability, Cat. 1 tracing. No contractions on toco.   Assessment and Plan:  Pregnancy: G2P1001 at 237w3d1. Encounter for supervision of normal pregnancy, antepartum, unspecified gravidity     SW met with patient in office.  Limited prenatal care.  Had been getting PNCentervillentil 20 weeks.  NOB labs obtained today.  Patient is working.  Records release obtained for prior C-section.  - [place] COMP + 14  WK; Future     Reactive NST.  NST obtained for limited PNC.    2. History of cesarean delivery affecting pregnancy     ?TOLAC: records release obtained.   - Hemoglobinopathy evaluation - Varicella zoster antibody, IgG - Culture, OB Urine - Hemoglobin A1c - Obstetric Panel, Including HIV - Cystic Fibrosis Mutation 97  Preterm labor symptoms and general obstetric precautions including but not limited to vaginal bleeding, contractions, leaking of fluid and fetal movement were reviewed in detail with the patient. Please refer to After Visit Summary for other counseling recommendations.  Return in about 2 weeks (around 06/27/2016) for ROB, 2 hr OGTT ASAP.   DeMorene CrockerCNM

## 2016-06-15 ENCOUNTER — Encounter (HOSPITAL_COMMUNITY): Payer: Self-pay | Admitting: Emergency Medicine

## 2016-06-15 ENCOUNTER — Emergency Department (HOSPITAL_COMMUNITY)
Admission: EM | Admit: 2016-06-15 | Discharge: 2016-06-15 | Disposition: A | Discharge status: Home / Self Care | Payer: Medicaid Other | Attending: Emergency Medicine | Admitting: Emergency Medicine

## 2016-06-15 DIAGNOSIS — Z79899 Other long term (current) drug therapy: Secondary | ICD-10-CM | POA: Diagnosis not present

## 2016-06-15 DIAGNOSIS — Z3A28 28 weeks gestation of pregnancy: Secondary | ICD-10-CM | POA: Insufficient documentation

## 2016-06-15 DIAGNOSIS — Z3493 Encounter for supervision of normal pregnancy, unspecified, third trimester: Secondary | ICD-10-CM

## 2016-06-15 DIAGNOSIS — L0231 Cutaneous abscess of buttock: Secondary | ICD-10-CM

## 2016-06-15 DIAGNOSIS — Z87891 Personal history of nicotine dependence: Secondary | ICD-10-CM | POA: Insufficient documentation

## 2016-06-15 DIAGNOSIS — O9989 Other specified diseases and conditions complicating pregnancy, childbirth and the puerperium: Secondary | ICD-10-CM | POA: Insufficient documentation

## 2016-06-15 LAB — CULTURE, OB URINE

## 2016-06-15 LAB — URINE CULTURE, OB REFLEX

## 2016-06-15 MED ORDER — LIDOCAINE-EPINEPHRINE (PF) 2 %-1:200000 IJ SOLN
10.0000 mL | Freq: Once | INTRAMUSCULAR | Status: AC
  Administered 2016-06-15: 10 mL
  Filled 2016-06-15: qty 20

## 2016-06-15 MED ORDER — ACETAMINOPHEN 500 MG PO TABS: 500.0000 mg | ORAL_TABLET | Freq: Four times a day (QID) | ORAL | 0 refills | Status: DC | PRN

## 2016-06-15 NOTE — ED Provider Notes (Signed)
[facility]-EMERGENCY DEPT Provider Note   CSN: [id number] Arrival date & time: 06/15/16  1018     History   Chief Complaint Chief Complaint  Patient presents with  . Abscess    HPI Sarah Schmitt is a 21 y.o. female.  Sarah Schmitt is a 21 y.o. Female who is 7 months pregnant who presents to the emergency department complaining of an abscess to her left buttocks ongoing for the past 5 days. Patient reports gradual onset of pain to her left buttocks. She denies any discharge. She denies history of abscesses to this area. She denies pain with bowel movements. No treatments attempted prior to arrival. She is 7 months pregnant and followed by [facility]. She denies fevers, abdominal pain, nausea, vomiting, diarrhea, rectal pain, vaginal bleeding, vaginal discharge, rashes or chills.   The history is provided by the patient and medical records. No language interpreter was used.  Abscess  Associated symptoms: no fever, no headaches, no nausea and no vomiting     Past Medical History:  Diagnosis Date  . Medical history non-contributory     Patient Active Problem List   Diagnosis Date Noted  . Encounter for supervision of normal pregnancy 04/03/2016  . Late prenatal care 04/03/2016  . History of cesarean delivery affecting pregnancy 03/14/2016  . Antepartum bleeding, second trimester 03/14/2016    Past Surgical History:  Procedure Laterality Date  . CESAREAN SECTION      OB History    Gravida Para Term Preterm AB Living   2 1 1    0 1   SAB TAB Ectopic Multiple Live Births   0 0     1       Home Medications    Prior to Admission medications   Medication Sig Start Date End Date Taking? Authorizing Provider  acetaminophen (TYLENOL) 500 MG tablet Take 1 tablet (500 mg total) by mouth every 6 (six) hours as needed for mild pain or moderate pain. 06/15/16   Everlene Farrier, PA-C  polyethylene glycol powder (GLYCOLAX/MIRALAX) powder Take 17 g by mouth daily. Patient  not taking: Reported on 06/13/2016 05/26/16   Donette Larry, CNM  Prenatal Vit-Fe Fumarate-FA (PREPLUS) 27-1 MG TABS Take 1 tablet by mouth daily. 03/23/16   Constant, Peggy, MD    Family History Family History  Problem Relation Age of Onset  . Diabetes Father   . Hypertension Maternal Grandmother   . Diabetes Paternal Grandmother     Social History Social History  Substance Use Topics  . Smoking status: Former Smoker    Types: Cigarettes  . Smokeless tobacco: Never Used  . Alcohol use No     Allergies   Patient has no known allergies.   Review of Systems Review of Systems  Constitutional: Negative for chills and fever.  HENT: Negative for congestion and sore throat.   Eyes: Negative for visual disturbance.  Respiratory: Negative for cough and shortness of breath.   Cardiovascular: Negative for chest pain.  Gastrointestinal: Negative for abdominal pain, nausea, rectal pain and vomiting.  Genitourinary: Negative for dysuria, vaginal bleeding and vaginal discharge.  Musculoskeletal: Negative for back pain and neck pain.  Skin: Positive for color change. Negative for rash.  Neurological: Negative for headaches.     Physical Exam Updated Vital Signs BP (!) 100/56   Pulse 74   Temp 98.9 F (37.2 C) (Oral)   Resp 16   LMP 11/21/2015   SpO2 99%   Physical Exam  Constitutional: She appears well-developed and  well-nourished. No distress.  Nontoxic appearing.  HENT:  Head: Normocephalic and atraumatic.  Eyes: Right eye exhibits no discharge. Left eye exhibits no discharge.  Pulmonary/Chest: Effort normal. No respiratory distress.  Abdominal: Soft. There is no tenderness.  Gravid abdomen.  Genitourinary:  Genitourinary Comments: 4 cm abscess noted to her left buttock. It is fluctuant without discharge or surrounding erythema. No evidence of cellulitis. I does not extend into her rectum. It is lateral to her vagina and rectum.  Neurological: She is alert.  Coordination normal.  Skin: Skin is warm and dry. Capillary refill takes less than 2 seconds. No rash noted. She is not diaphoretic. No erythema. No pallor.  Psychiatric: She has a normal mood and affect. Her behavior is normal.  Nursing note and vitals reviewed.    ED Treatments / Results  Labs (all labs ordered are listed, but only abnormal results are displayed) Labs Reviewed - No data to display  EKG  EKG Interpretation None       Radiology EMERGENCY DEPARTMENT US SOFT TISSUE INTERPRETATION "Study: Limited Soft Tissue Ultrasound"  INDICATIONS: Soft tissue infection Multiple views of the body part were obtained in real-time with a multi-frequency linear probe  PERFORMED BY: Myself IMAGES ARCHIVED?: Yes SIDE:Left BODY PART:left buttock INTERPRETATION:  Abcess present and No cellulitis noted       Procedures .Marland KitchenIncision and Drainage Date/Time: 06/15/2016 2:13 PM Performed by: Everlene Farrier Authorized by: Everlene Farrier   Consent:    Consent obtained:  Verbal   Consent given by:  Patient   Risks discussed:  Bleeding, pain, incomplete drainage, infection and damage to other organs Location:    Type:  Abscess   Size:  4 cm    Location:  Anogenital   Anogenital location: left buttock. Pre-procedure details:    Skin preparation:  Betadine Anesthesia (see MAR for exact dosages):    Anesthesia method:  Local infiltration   Local anesthetic:  Lidocaine 1% WITH epi Procedure type:    Complexity:  Complex Procedure details:    Needle aspiration: no     Incision types:  Single straight   Incision depth:  Dermal   Scalpel blade:  11   Wound management:  Probed and deloculated, irrigated with saline and extensive cleaning   Drainage:  Purulent   Drainage amount:  Copious   Wound treatment:  Wound left open   Packing materials:  None Post-procedure details:    Patient tolerance of procedure:  Tolerated well, no immediate complications   (including critical  care time)  Medications Ordered in ED Medications  lidocaine-EPINEPHrine (XYLOCAINE W/EPI) 2 %-1:200000 (PF) injection 10 mL (10 mLs Infiltration Given by Other 06/15/16 1342)     Initial Impression / Assessment and Plan / ED Course  I have reviewed the triage vital signs and the nursing notes.  Pertinent labs & imaging results that were available during my care of the patient were reviewed by me and considered in my medical decision making (see chart for details).    This  is a 21 y.o. Female who is 7 months pregnant who presents to the emergency department complaining of an abscess to her left buttocks ongoing for the past 5 days. Patient reports gradual onset of pain to her left buttocks. She denies any discharge. She denies history of abscesses to this area. She denies pain with bowel movements. No treatments attempted prior to arrival. She is 7 months pregnant and followed by [facility]. She denies abdominal pain, fevers, vaginal bleeding, vaginal discharge.  On exam the patient is afebrile nontoxic appearing. She has a 4 cm fluctuant abscess noted to her left buttock. No discharge noted. It is not extended into her rectum. No surrounding erythema. No evidence of cellulitis on ultrasound.  Incision and drainage was performed by myself and tolerated well by the patient. A copious amount of purulent material was expressed. It was irrigated with a copious amount of saline. I educated patient on abscess care after incision and drainage. I encouraged her to use sitz baths and watch for signs of infection. No antibiotics at this time. She has follow-up with her OB tomorrow. I encouraged her to keep this appointment and have this rechecked. Patient agrees with this. I discussed strict and specific return precautions. I advised the patient to follow-up with their primary care provider this week. I advised the patient to return to the emergency department with new or worsening symptoms or new concerns.  The patient verbalized understanding and agreement with plan.      Final Clinical Impressions(s) / ED Diagnoses   Final diagnoses:  Abscess of buttock, left  Pregnant and not yet delivered in third trimester    New Prescriptions New Prescriptions   ACETAMINOPHEN (TYLENOL) 500 MG TABLET    Take 1 tablet (500 mg total) by mouth every 6 (six) hours as needed for mild pain or moderate pain.     Everlene FarrierDansie, Daune Divirgilio, PA-C 06/15/16 1419    Little, Ambrose Finlandachel Morgan, MD 06/15/16 [phone]

## 2016-06-15 NOTE — ED Notes (Addendum)
ED Provider at bedside preforming I&D 

## 2016-06-15 NOTE — ED Notes (Signed)
Pt removed BP cuff and pulse ox, will place pt back on monitor after I&D per pt request.

## 2016-06-15 NOTE — ED Notes (Signed)
Pt ambulated to room from waiting area. Pt ambulated with a steady gait and had no complaints.  

## 2016-06-15 NOTE — ED Triage Notes (Signed)
Pt here with abscess to left buttocks area unsure if near her rectum; pt sts increased pain; pt currently 7 months pregnant receiving prenatal care

## 2016-06-16 ENCOUNTER — Other Ambulatory Visit: Payer: Medicaid Other

## 2016-06-16 DIAGNOSIS — Z3483 Encounter for supervision of other normal pregnancy, third trimester: Secondary | ICD-10-CM

## 2016-06-17 LAB — RPR: RPR: NONREACTIVE

## 2016-06-17 LAB — HIV ANTIBODY (ROUTINE TESTING W REFLEX): HIV SCREEN 4TH GENERATION: NONREACTIVE

## 2016-06-17 LAB — CBC
HEMATOCRIT: 33.7 % — AB (ref 34.0–46.6)
HEMOGLOBIN: 11 g/dL — AB (ref 11.1–15.9)
MCH: 29.5 pg (ref 26.6–33.0)
MCHC: 32.6 g/dL (ref 31.5–35.7)
MCV: 90 fL (ref 79–97)
Platelets: 247 10*3/uL (ref 150–379)
RBC: 3.73 x10E6/uL — AB (ref 3.77–5.28)
RDW: 14.5 % (ref 12.3–15.4)
WBC: 7.4 10*3/uL (ref 3.4–10.8)

## 2016-06-17 LAB — GLUCOSE TOLERANCE, 2 HOURS W/ 1HR
Glucose, 1 hour: 101 mg/dL (ref 65–179)
Glucose, 2 hour: 85 mg/dL (ref 65–152)
Glucose, Fasting: 71 mg/dL (ref 65–91)

## 2016-06-19 ENCOUNTER — Other Ambulatory Visit: Payer: Self-pay | Admitting: Certified Nurse Midwife

## 2016-06-19 DIAGNOSIS — Z3483 Encounter for supervision of other normal pregnancy, third trimester: Secondary | ICD-10-CM

## 2016-06-20 ENCOUNTER — Other Ambulatory Visit: Payer: Self-pay | Admitting: Certified Nurse Midwife

## 2016-06-20 DIAGNOSIS — Z3483 Encounter for supervision of other normal pregnancy, third trimester: Secondary | ICD-10-CM

## 2016-06-20 LAB — OBSTETRIC PANEL, INCLUDING HIV
ANTIBODY SCREEN: NEGATIVE
Basophils Absolute: 0 10*3/uL (ref 0.0–0.2)
Basos: 0 %
EOS (ABSOLUTE): 0.1 10*3/uL (ref 0.0–0.4)
EOS: 1 %
HEMATOCRIT: 33.7 % — AB (ref 34.0–46.6)
HIV Screen 4th Generation wRfx: NONREACTIVE
Hemoglobin: 10.9 g/dL — ABNORMAL LOW (ref 11.1–15.9)
Hepatitis B Surface Ag: NEGATIVE
IMMATURE GRANS (ABS): 0 10*3/uL (ref 0.0–0.1)
IMMATURE GRANULOCYTES: 0 %
LYMPHS: 31 %
Lymphocytes Absolute: 3.1 10*3/uL (ref 0.7–3.1)
MCH: 29.1 pg (ref 26.6–33.0)
MCHC: 32.3 g/dL (ref 31.5–35.7)
MCV: 90 fL (ref 79–97)
MONOCYTES: 8 %
MONOS ABS: 0.8 10*3/uL (ref 0.1–0.9)
NEUTROS PCT: 60 %
Neutrophils Absolute: 5.9 10*3/uL (ref 1.4–7.0)
Platelets: 266 10*3/uL (ref 150–379)
RBC: 3.74 x10E6/uL — AB (ref 3.77–5.28)
RDW: 14.5 % (ref 12.3–15.4)
RH TYPE: POSITIVE
RPR Ser Ql: NONREACTIVE
RUBELLA: 1.14 {index} (ref >0.99)
WBC: 9.9 10*3/uL (ref 3.4–10.8)

## 2016-06-20 LAB — HEMOGLOBINOPATHY EVALUATION
HEMOGLOBIN A2 QUANTITATION: 2.3 % (ref 1.8–3.2)
HGB A: 97.7 % (ref 96.4–98.8)
HGB C: 0 %
HGB S: 0 %
HGB VARIANT: 0 %
Hemoglobin F Quantitation: 0 % (ref 0.0–2.0)

## 2016-06-20 LAB — CYSTIC FIBROSIS MUTATION 97: Interpretation: NOT DETECTED

## 2016-06-20 LAB — HEMOGLOBIN A1C
ESTIMATED AVERAGE GLUCOSE: 94 mg/dL
HEMOGLOBIN A1C: 4.9 % (ref 4.8–5.6)

## 2016-06-20 LAB — VARICELLA ZOSTER ANTIBODY, IGG: VARICELLA: 953 {index} (ref >165)

## 2016-06-28 ENCOUNTER — Encounter: Payer: Self-pay | Admitting: *Deleted

## 2016-06-28 ENCOUNTER — Encounter: Payer: Self-pay | Admitting: Certified Nurse Midwife

## 2016-06-28 ENCOUNTER — Ambulatory Visit (INDEPENDENT_AMBULATORY_CARE_PROVIDER_SITE_OTHER): Payer: Medicaid Other | Admitting: Certified Nurse Midwife

## 2016-06-28 DIAGNOSIS — Z3483 Encounter for supervision of other normal pregnancy, third trimester: Secondary | ICD-10-CM

## 2016-06-28 DIAGNOSIS — O34219 Maternal care for unspecified type scar from previous cesarean delivery: Secondary | ICD-10-CM

## 2016-06-28 NOTE — Progress Notes (Signed)
Patient reports good fetal movement with contractions that come and go. 

## 2016-06-28 NOTE — Patient Instructions (Addendum)
AREA PEDIATRIC/FAMILY PRACTICE PHYSICIANS  [facility] [address], [address] [place], [place]  [address] Phone - [phone]   Fax - [phone]  [facility] [address] [place], [place] [address] Phone - [phone]   Fax - [phone]  JACK AM[facility] [address] [place], [place]  [address] Phone - [phone]   Fax - [phone]  [facility] [address] [place], [place]  [address] Phone - [phone]   Fax - [phone]   [facility] [address] [place], [place]  [address] Phone - [phone]   Fax - [phone]  [facility] [address], [address] [place], [place]  [address] Phone - [phone]   Fax - [phone]  [facility] [place] [address] [place], [place]  [address] Phone - [phone]   Fax - [phone]  [facility] [address], [address] [place], [place]  [address] Phone - [phone]   Fax - [phone]  [facility] [address] [place], [place]  [address] Phone - [phone]   Fax - [phone] [facility] [address] [place], [place]  [address] Phone - [phone]   Fax - [phone]  [facility] [address] [place], [place]  [address] Phone - [phone]   Fax - [phone]  [facility] [address] [place], [place]  [address] Phone - [phone]   Fax - [phone]  [facility] [address] [place], [place]  [address] Phone - [phone]   Fax - [phone]  SHILPA GOSRANI [address] [place], [place]  [address] Phone - [phone]  [facility] [address] [place], [place]  [address] Phone - [phone]   Fax - [phone]  [facility] [address] [place], [place]  [address] Phone - [phone]   Fax - [phone]  [facility] [address] [place], [place]  [address] Phone - [phone]   Fax - [phone]  [facility] [address] [place], [place]  [address] Phone - [phone]   Fax - [phone]   [facility] [address] [place], [place]  [address] Phone - [phone]   Fax - [phone]  [facility] [address] [place], [place]  [address] Phone - [phone]   Fax - [phone]  DAVID RUBIN [address] [place], [place]  [address] Phone - [phone]   Fax - [phone]  [facility] [address] [place], [place]  [address] Phone - [phone]   Fax - [phone]  [facility] [address] , [place]  [address] Phone - [phone]   Fax - [phone] [facility] [address] [place], [place]  [address] Phone - [phone]   Fax - [phone]  [facility] [address] [place], [place]  [address] Phone - [phone]   Fax - [phone]  [facility] [place] [address] [place],   [address] Phone - [phone]   Fax - [phone]  [facility] [place] Charlene Flemming MD [address] [place]  [address] Phone [phone]  Fax [phone]  Contraception Choices Contraception (birth control) is the use of any methods or devices to prevent   pregnancy. Below are some methods to help avoid pregnancy. Hormonal methods  Contraceptive implant. This is a thin, plastic tube containing progesterone hormone. It does not contain estrogen hormone. Your health care provider inserts the tube in the inner part of the upper arm. The tube can remain in place for up to 3 years. After 3 years, the implant must be removed. The implant prevents the  ovaries from releasing an egg (ovulation), thickens the cervical mucus to prevent sperm from entering the uterus, and thins the lining of the inside of the uterus.  Progesterone-only injections. These injections are given every 3 months by your health care provider to prevent pregnancy. This synthetic progesterone hormone stops the ovaries from releasing eggs. It also thickens cervical mucus and changes the uterine lining. This makes it harder for sperm to survive in the uterus.  Birth control pills. These pills contain estrogen and progesterone hormone. They work by preventing the ovaries from releasing eggs (ovulation). They also cause the cervical mucus to thicken, preventing the sperm from entering the uterus. Birth control pills are prescribed by a health care provider.Birth control pills can also be used to treat heavy periods.  Minipill. This type of birth control pill contains only the progesterone hormone. They are taken every day of each month and must be prescribed by your health care provider.  Birth control patch. The patch contains hormones similar to those in birth control pills. It must be changed once a week and is prescribed by a health care provider.  Vaginal ring. The ring contains hormones similar to those in birth control pills. It is left in the vagina for 3 weeks, removed for 1 week, and then a new one is put back in place. The patient must be comfortable inserting and removing the ring from the vagina.A health care provider's prescription is necessary.  Emergency contraception. Emergency contraceptives prevent pregnancy after unprotected sexual intercourse. This pill can be taken right after sex or up to 5 days after unprotected sex. It is most effective the sooner you take the pills after having sexual intercourse. Most emergency contraceptive pills are available without a prescription. Check with your pharmacist. Do not use emergency contraception as your only form of birth  control. Barrier methods  Female condom. This is a thin sheath (latex or rubber) that is worn over the penis during sexual intercourse. It can be used with spermicide to increase effectiveness.  Female condom. This is a soft, loose-fitting sheath that is put into the vagina before sexual intercourse.  Diaphragm. This is a soft, latex, dome-shaped barrier that must be fitted by a health care provider. It is inserted into the vagina, along with a spermicidal jelly. It is inserted before intercourse. The diaphragm should be left in the vagina for 6 to 8 hours after intercourse.  Cervical cap. This is a round, soft, latex or plastic cup that fits over the cervix and must be fitted by a health care provider. The cap can be left in place for up to 48 hours after intercourse.  Sponge. This is a soft, circular piece of polyurethane foam. The sponge has spermicide in it. It is inserted into the vagina after wetting it and before sexual intercourse.  Spermicides. These are chemicals that kill or block sperm from entering the cervix and uterus. They come in the form of creams, jellies, suppositories, foam, or tablets. They do not require a prescription. They are inserted into the vagina with an applicator before having sexual intercourse. The process   must be repeated every time you have sexual intercourse. Intrauterine contraception  Intrauterine device (IUD). This is a T-shaped device that is put in a woman's uterus during a menstrual period to prevent pregnancy. There are 2 types: ? Copper IUD. This type of IUD is wrapped in copper wire and is placed inside the uterus. Copper makes the uterus and fallopian tubes produce a fluid that kills sperm. It can stay in place for 10 years. ? Hormone IUD. This type of IUD contains the hormone progestin (synthetic progesterone). The hormone thickens the cervical mucus and prevents sperm from entering the uterus, and it also thins the uterine lining to prevent  implantation of a fertilized egg. The hormone can weaken or kill the sperm that get into the uterus. It can stay in place for 3-5 years, depending on which type of IUD is used. Permanent methods of contraception  Female tubal ligation. This is when the woman's fallopian tubes are surgically sealed, tied, or blocked to prevent the egg from traveling to the uterus.  Hysteroscopic sterilization. This involves placing a small coil or insert into each fallopian tube. Your doctor uses a technique called hysteroscopy to do the procedure. The device causes scar tissue to form. This results in permanent blockage of the fallopian tubes, so the sperm cannot fertilize the egg. It takes about 3 months after the procedure for the tubes to become blocked. You must use another form of birth control for these 3 months.  Female sterilization. This is when the female has the tubes that carry sperm tied off (vasectomy).This blocks sperm from entering the vagina during sexual intercourse. After the procedure, the man can still ejaculate fluid (semen). Natural planning methods  Natural family planning. This is not having sexual intercourse or using a barrier method (condom, diaphragm, cervical cap) on days the woman could become pregnant.  Calendar method. This is keeping track of the length of each menstrual cycle and identifying when you are fertile.  Ovulation method. This is avoiding sexual intercourse during ovulation.  Symptothermal method. This is avoiding sexual intercourse during ovulation, using a thermometer and ovulation symptoms.  Post-ovulation method. This is timing sexual intercourse after you have ovulated. Regardless of which type or method of contraception you choose, it is important that you use condoms to protect against the transmission of sexually transmitted infections (STIs). Talk with your health care provider about which form of contraception is most appropriate for you. This information is not  intended to replace advice given to you by your health care provider. Make sure you discuss any questions you have with your health care provider. Document Released: 01/16/2005 Document Revised: 06/24/2015 Document Reviewed: 07/11/2012 Elsevier Interactive Patient Education  2017 Elsevier Inc.  

## 2016-06-30 ENCOUNTER — Other Ambulatory Visit: Payer: Self-pay | Admitting: Certified Nurse Midwife

## 2016-06-30 NOTE — Progress Notes (Signed)
   PRENATAL VISIT NOTE  Subjective:  Sarah Schmitt is a 21 y.o. G2P1001 at 3161w5d being seen today for ongoing prenatal care.  She is currently monitored for the following issues for this low-risk pregnancy and has History of cesarean delivery affecting pregnancy; Antepartum bleeding, second trimester; Encounter for supervision of normal pregnancy; and Late prenatal care on her problem list.  Patient reports no complaints.  Contractions: Irregular. Vag. Bleeding: None.  Movement: Present. Denies leaking of fluid.   The following portions of the patient's history were reviewed and updated as appropriate: allergies, current medications, past family history, past medical history, past social history, past surgical history and problem list. Problem list updated.  Objective:   Vitals:   06/28/16 1514  BP: 110/62  Pulse: 77  Weight: 203 lb 1.6 oz (92.1 kg)    Fetal Status: Fetal Heart Rate (bpm): 130 Fundal Height: 31 cm Movement: Present     General:  Alert, oriented and cooperative. Patient is in no acute distress.  Skin: Skin is warm and dry. No rash noted.   Cardiovascular: Normal heart rate noted  Respiratory: Normal respiratory effort, no problems with respiration noted  Abdomen: Soft, gravid, appropriate for gestational age. Pain/Pressure: Absent     Pelvic:  Cervical exam deferred        Extremities: Normal range of motion.  Edema: Trace  Mental Status: Normal mood and affect. Normal behavior. Normal judgment and thought content.   Assessment and Plan:  Pregnancy: G2P1001 at 8061w5d  1. Encounter for supervision of other normal pregnancy in third trimester     Doing well  2. History of cesarean delivery affecting pregnancy     TOLAC planned.   Preterm labor symptoms and general obstetric precautions including but not limited to vaginal bleeding, contractions, leaking of fluid and fetal movement were reviewed in detail with the patient. Please refer to After Visit Summary  for other counseling recommendations.  Return in about 2 weeks (around 07/12/2016) for ROB.   Roe Coombsachelle A Hilma Steinhilber, CNM

## 2016-07-03 ENCOUNTER — Ambulatory Visit (HOSPITAL_COMMUNITY): Payer: Medicaid Other

## 2016-07-10 ENCOUNTER — Other Ambulatory Visit: Payer: Self-pay | Admitting: Certified Nurse Midwife

## 2016-07-10 ENCOUNTER — Ambulatory Visit (HOSPITAL_COMMUNITY)
Admission: RE | Admit: 2016-07-10 | Discharge: 2016-07-10 | Disposition: A | Discharge status: Home / Self Care | Payer: Medicaid Other | Source: Ambulatory Visit | Attending: Certified Nurse Midwife | Admitting: Certified Nurse Midwife

## 2016-07-10 DIAGNOSIS — Z363 Encounter for antenatal screening for malformations: Secondary | ICD-10-CM | POA: Insufficient documentation

## 2016-07-10 DIAGNOSIS — Z3490 Encounter for supervision of normal pregnancy, unspecified, unspecified trimester: Secondary | ICD-10-CM

## 2016-07-10 DIAGNOSIS — Z3A33 33 weeks gestation of pregnancy: Secondary | ICD-10-CM | POA: Insufficient documentation

## 2016-07-10 DIAGNOSIS — O34219 Maternal care for unspecified type scar from previous cesarean delivery: Secondary | ICD-10-CM | POA: Diagnosis not present

## 2016-07-10 DIAGNOSIS — O99213 Obesity complicating pregnancy, third trimester: Secondary | ICD-10-CM

## 2016-07-10 DIAGNOSIS — O0933 Supervision of pregnancy with insufficient antenatal care, third trimester: Secondary | ICD-10-CM

## 2016-07-12 ENCOUNTER — Encounter: Payer: Medicaid Other | Admitting: Certified Nurse Midwife

## 2016-07-12 ENCOUNTER — Other Ambulatory Visit: Payer: Self-pay | Admitting: Certified Nurse Midwife

## 2016-07-12 DIAGNOSIS — Z3483 Encounter for supervision of other normal pregnancy, third trimester: Secondary | ICD-10-CM

## 2016-07-21 ENCOUNTER — Encounter (HOSPITAL_COMMUNITY): Payer: Self-pay | Admitting: *Deleted

## 2016-07-21 ENCOUNTER — Inpatient Hospital Stay (HOSPITAL_COMMUNITY)
Admission: AD | Admit: 2016-07-21 | Discharge: 2016-07-21 | Disposition: A | Discharge status: Home / Self Care | Payer: Medicaid Other | Source: Ambulatory Visit | Attending: Family Medicine | Admitting: Family Medicine

## 2016-07-21 DIAGNOSIS — O26893 Other specified pregnancy related conditions, third trimester: Secondary | ICD-10-CM

## 2016-07-21 DIAGNOSIS — Z3A34 34 weeks gestation of pregnancy: Secondary | ICD-10-CM | POA: Diagnosis not present

## 2016-07-21 DIAGNOSIS — N949 Unspecified condition associated with female genital organs and menstrual cycle: Secondary | ICD-10-CM

## 2016-07-21 DIAGNOSIS — Z79899 Other long term (current) drug therapy: Secondary | ICD-10-CM | POA: Insufficient documentation

## 2016-07-21 DIAGNOSIS — Z87891 Personal history of nicotine dependence: Secondary | ICD-10-CM | POA: Insufficient documentation

## 2016-07-21 LAB — URINALYSIS, ROUTINE W REFLEX MICROSCOPIC
Bilirubin Urine: NEGATIVE
Glucose, UA: NEGATIVE mg/dL
Hgb urine dipstick: NEGATIVE
KETONES UR: NEGATIVE mg/dL
LEUKOCYTES UA: NEGATIVE
NITRITE: NEGATIVE
PROTEIN: NEGATIVE mg/dL
Specific Gravity, Urine: 1.011 (ref 1.005–1.030)
pH: 8 (ref 5.0–8.0)

## 2016-07-21 NOTE — MAU Provider Note (Signed)
  History     CSN: [id number]  Arrival date and time: 07/21/16 1210   First Provider Initiated Contact with Patient 07/21/16 1314      Chief Complaint  Patient presents with  . Contractions   HPI  [name] is a G2P1001 at 34.[redacted] wks gestation presenting to MAU with complaints of UC's that started at 0500 and pelvic pressure x 3 days.  She states her last UC was at 0800 this morning, but the pelvic pressure has persisted. She denies VB, LOF or abnormal vaginal d/c. [facility] and has her next appt on Monday 6/25.  Past Medical History:  Diagnosis Date  . Medical history non-contributory     Past Surgical History:  Procedure Laterality Date  . CESAREAN SECTION      Family History  Problem Relation Age of Onset  . Diabetes Father   . Hypertension Maternal Grandmother   . Diabetes Paternal Grandmother     Social History  Substance Use Topics  . Smoking status: Former Smoker    Types: Cigarettes  . Smokeless tobacco: Never Used  . Alcohol use No    Allergies: No Known Allergies  Prescriptions Prior to Admission  Medication Sig Dispense Refill Last Dose  . acetaminophen (TYLENOL) 500 MG tablet Take 1 tablet (500 mg total) by mouth every 6 (six) hours as needed for mild pain or moderate pain. (Patient not taking: Reported on 06/28/2016) 30 tablet 0 Not Taking  . polyethylene glycol powder (GLYCOLAX/MIRALAX) powder Take 17 g by mouth daily. (Patient not taking: Reported on 06/13/2016) 255 g 0 Not Taking  . Prenatal Vit-Fe Fumarate-FA (PREPLUS) 27-1 MG TABS Take 1 tablet by mouth daily. 30 tablet 13 Taking    Review of Systems Physical Exam   Blood pressure 119/65, pulse 90, temperature 98.2 F (36.8 C), resp. rate 18, height 5\' 4"  (1.626 m), weight 92.5 kg (204 lb), last menstrual period 11/21/2015.  Physical Exam  Constitutional: She is oriented to person, place, and time. She appears well-developed and well-nourished.  HENT:  Head:  Normocephalic.  Eyes: Pupils are equal, round, and reactive to light.  Neck: Normal range of motion.  Cardiovascular: Normal rate, normal heart sounds and intact distal pulses.   Respiratory: Effort normal and breath sounds normal.  GI: Soft. Bowel sounds are normal.  Genitourinary: Vagina normal. Pelvic exam was performed with patient supine. Uterus is enlarged.  Genitourinary Comments: Uterus: gravid, S=D, cx; closed/long/soft, no CMT or bloody show, no adnexal tenderness    Musculoskeletal: Normal range of motion.  Neurological: She is alert and oriented to person, place, and time. She has normal reflexes.  Skin: Skin is warm and dry.  Psychiatric: She has a normal mood and affect. Her behavior is normal. Judgment and thought content normal.    MAU Course  Procedures - none MDM NST - FHR: 165 bpm / moderate variability / accels present / decels absent TOCO: none noted Cervical Exam Dilation: Closed Effacement (%): Thick Cervical Position: Middle Station: Ballotable Presentation: Vertex Exam by:: Carloyn Jaeger. Donyea Gafford, CNM   Assessment and Plan  Pelvic pressure in pregnancy, antepartum, third trimester - False Labor precautions discussed and written instructions given - Keep scheduled app[facility] with [facility] on 07[facility]24[facility]16  Discharge home Patient verbalized an understanding of the plan of care and agrees.  Raelyn Moraolitta Madelon Welsch, MSN, CNM 07/21/2016, 1:19 PM

## 2016-07-21 NOTE — Discharge Instructions (Signed)

## 2016-07-21 NOTE — MAU Note (Signed)
Urine in lab 

## 2016-07-21 NOTE — MAU Note (Signed)
Pt presents to MAU with complaints of contractions and lower abdominal pressure for three days. Pt states that she could not find anyone to watch her child until today to come in and be evaluated. PT denies any VB or abnormal discharge

## 2016-07-24 ENCOUNTER — Ambulatory Visit (INDEPENDENT_AMBULATORY_CARE_PROVIDER_SITE_OTHER): Payer: Medicaid Other | Admitting: Certified Nurse Midwife

## 2016-07-24 ENCOUNTER — Other Ambulatory Visit (HOSPITAL_COMMUNITY)
Admission: RE | Admit: 2016-07-24 | Discharge: 2016-07-24 | Disposition: A | Discharge status: Home / Self Care | Payer: Medicaid Other | Source: Ambulatory Visit | Attending: Certified Nurse Midwife | Admitting: Certified Nurse Midwife

## 2016-07-24 VITALS — BP 96/67 | HR 92 | Wt 199.0 lb

## 2016-07-24 DIAGNOSIS — O98913 Unspecified maternal infectious and parasitic disease complicating pregnancy, third trimester: Secondary | ICD-10-CM | POA: Insufficient documentation

## 2016-07-24 DIAGNOSIS — Z3483 Encounter for supervision of other normal pregnancy, third trimester: Secondary | ICD-10-CM

## 2016-07-24 DIAGNOSIS — Z3A00 Weeks of gestation of pregnancy not specified: Secondary | ICD-10-CM | POA: Insufficient documentation

## 2016-07-24 DIAGNOSIS — B9689 Other specified bacterial agents as the cause of diseases classified elsewhere: Secondary | ICD-10-CM | POA: Diagnosis not present

## 2016-07-24 DIAGNOSIS — N760 Acute vaginitis: Secondary | ICD-10-CM | POA: Diagnosis not present

## 2016-07-24 DIAGNOSIS — O34219 Maternal care for unspecified type scar from previous cesarean delivery: Secondary | ICD-10-CM

## 2016-07-24 DIAGNOSIS — L738 Other specified follicular disorders: Secondary | ICD-10-CM

## 2016-07-24 MED ORDER — TRIAMCINOLONE ACETONIDE 0.5 % EX OINT: 1.0000 "application " | TOPICAL_OINTMENT | Freq: Two times a day (BID) | CUTANEOUS | 99 refills | Status: DC

## 2016-07-24 NOTE — Patient Instructions (Signed)
AREA PEDIATRIC/FAMILY PRACTICE PHYSICIANS  [facility] [address], [address] [place], [place]  [address] Phone - [phone]   Fax - [phone]  [facility] [address] [place], [place] [address] Phone - [phone]   Fax - [phone]  [facility] AM[facility] [address] [place], [place]  [address] Phone - [phone]   Fax - [phone]  [facility] [address] [place], [place]  [address] Phone - [phone]   Fax - [phone]  [facility] [address] [place], [place]  [address] Phone - [phone]   Fax - [phone]  [facility] [address] [place], [place]  [address] Phone - [phone]   Fax - [phone]  [facility] [address] [place], [place]  [address] Phone - [phone]   Fax - [phone]  [facility] [address] [place], [place]  [address] Phone - [phone]   Fax - [phone]  [facility] [address] [place], [place]  [address] Phone - [phone]   Fax - [phone] [facility] [address] [place], [place]  [address] Phone - [phone]   Fax - [phone]  [facility] [address] [place], [place]  [address] Phone - [phone]   Fax - [phone]  [facility] [address] [place], [place]  [address] Phone - [phone]   Fax - [phone]  [facility] [address], [address] [place], [place]  [address] Phone - [phone]   Fax - [phone]  [facility]IL[facility] GOSRANI [address] [place], [place]  [address] Phone - [phone]  [facility] [address] [place], [place]  [address] Phone - [phone]   Fax - [phone]  [facility] [address] [place], [place]  [address] Phone - [phone]   Fax - [phone]  [facility] [address] [place], [place]  [address] Phone - [phone]   Fax - [phone]  [facility] [address] [place], [place]  [address] Phone - [phone]   Fax - [phone]   [facility] [address] [place], [place]  [address] Phone - [phone]   Fax - [phone]  [facility] [address] [place], [place]  [address] Phone - [phone]   Fax - [phone]  [facility] RUB[facility] [address] [place], [place]  [address] Phone - [phone]   Fax - [phone]  [facility] [address] , [place]  [address] Phone - [phone]   Fax - [phone]  [facility] [address] , [place]  [address] Phone - [phone]   Fax - [phone] [facility] [address] [place], [place]  [address] Phone - [phone]   Fax - [phone]  [facility] [address] [place], [place]  [address] Phone - [phone]   Fax - [phone]  [facility] [place] [address] [place], [place]  [address] Phone - [phone]   Fax - [phone]  [facility] [place] Charlene Flemming MD [address] [place] [address] Phone [phone]  Fax [phone]   

## 2016-07-24 NOTE — Addendum Note (Signed)
Addended by: Orvilla CornwallENNEY, Raenette Sakata A on: 07/24/2016 02:54 PM   Modules accepted: Orders

## 2016-07-24 NOTE — Progress Notes (Addendum)
   PRENATAL VISIT NOTE  Subjective:  Sarah Schmitt is a 21 y.o. G2P1001 at 8497w1d being seen today for ongoing prenatal care.  She is currently monitored for the following issues for this low-risk pregnancy and has History of cesarean delivery affecting pregnancy; Antepartum bleeding, second trimester; Encounter for supervision of normal pregnancy; and Late prenatal care on her problem list.  Patient reports no complaints.  Contractions: Irregular. Vag. Bleeding: None.  Movement: Present. Denies leaking of fluid. Reports scaling on feet bilaterally usually in the summer, denies any itching.   The following portions of the patient's history were reviewed and updated as appropriate: allergies, current medications, past family history, past medical history, past social history, past surgical history and problem list. Problem list updated.  Objective:   Vitals:   07/24/16 1429  BP: 96/67  Pulse: 92  Weight: 199 lb (90.3 kg)    Fetal Status: Fetal Heart Rate (bpm): 140 Fundal Height: 35 cm Movement: Present  Presentation: Vertex  General:  Alert, oriented and cooperative. Patient is in no acute distress.  Skin: Skin is warm and dry. No rash noted.   Cardiovascular: Normal heart rate noted  Respiratory: Normal respiratory effort, no problems with respiration noted  Abdomen: Soft, gravid, appropriate for gestational age. Pain/Pressure: Present     Pelvic:  Cervical exam performed Dilation: Closed Effacement (%): Thick Station: Ballotable  Extremities: Normal range of motion.  Edema: None  Mental Status: Normal mood and affect. Normal behavior. Normal judgment and thought content.   Assessment and Plan:  Pregnancy: G2P1001 at 3997w1d  1. Encounter for supervision of other normal pregnancy in third trimester     Doing well. Bilateral foot eczema.  Kenalog cream ordered.  - Strep Gp B NAA - Cervicovaginal ancillary only  2. History of cesarean delivery affecting pregnancy      TOLAC  Preterm labor symptoms and general obstetric precautions including but not limited to vaginal bleeding, contractions, leaking of fluid and fetal movement were reviewed in detail with the patient. Please refer to After Visit Summary for other counseling recommendations.  Return in about 1 week (around 07/31/2016) for ROB.   Roe Coombsachelle A Denney, CNM

## 2016-07-25 LAB — CERVICOVAGINAL ANCILLARY ONLY
Bacterial vaginitis: POSITIVE — AB
CANDIDA VAGINITIS: NEGATIVE
Chlamydia: NEGATIVE
Neisseria Gonorrhea: NEGATIVE
Trichomonas: NEGATIVE

## 2016-07-26 ENCOUNTER — Other Ambulatory Visit: Payer: Self-pay | Admitting: Certified Nurse Midwife

## 2016-07-26 DIAGNOSIS — B9689 Other specified bacterial agents as the cause of diseases classified elsewhere: Secondary | ICD-10-CM

## 2016-07-26 DIAGNOSIS — Z3483 Encounter for supervision of other normal pregnancy, third trimester: Secondary | ICD-10-CM

## 2016-07-26 DIAGNOSIS — N760 Acute vaginitis: Principal | ICD-10-CM

## 2016-07-26 LAB — STREP GP B NAA: Strep Gp B NAA: NEGATIVE

## 2016-07-26 MED ORDER — METRONIDAZOLE 500 MG PO TABS: 500.0000 mg | ORAL_TABLET | Freq: Two times a day (BID) | ORAL | 0 refills | Status: DC

## 2016-07-31 ENCOUNTER — Encounter: Payer: Medicaid Other | Admitting: Certified Nurse Midwife

## 2016-08-06 ENCOUNTER — Encounter (HOSPITAL_COMMUNITY): Payer: Self-pay

## 2016-08-06 ENCOUNTER — Inpatient Hospital Stay (HOSPITAL_COMMUNITY)
Admission: AD | Admit: 2016-08-06 | Discharge: 2016-08-06 | Disposition: A | Discharge status: Home / Self Care | Payer: Medicaid Other | Source: Ambulatory Visit | Attending: Obstetrics & Gynecology | Admitting: Obstetrics & Gynecology

## 2016-08-06 DIAGNOSIS — R102 Pelvic and perineal pain: Secondary | ICD-10-CM | POA: Diagnosis not present

## 2016-08-06 DIAGNOSIS — Z3A37 37 weeks gestation of pregnancy: Secondary | ICD-10-CM | POA: Insufficient documentation

## 2016-08-06 DIAGNOSIS — Z87891 Personal history of nicotine dependence: Secondary | ICD-10-CM | POA: Diagnosis not present

## 2016-08-06 DIAGNOSIS — O26893 Other specified pregnancy related conditions, third trimester: Secondary | ICD-10-CM | POA: Insufficient documentation

## 2016-08-06 DIAGNOSIS — W109XXA Fall (on) (from) unspecified stairs and steps, initial encounter: Secondary | ICD-10-CM | POA: Insufficient documentation

## 2016-08-06 LAB — URINALYSIS, ROUTINE W REFLEX MICROSCOPIC
Bilirubin Urine: NEGATIVE
GLUCOSE, UA: NEGATIVE mg/dL
Hgb urine dipstick: NEGATIVE
Ketones, ur: 20 mg/dL — AB
Leukocytes, UA: NEGATIVE
Nitrite: NEGATIVE
PH: 6 (ref 5.0–8.0)
Protein, ur: 30 mg/dL — AB
Specific Gravity, Urine: 1.023 (ref 1.005–1.030)

## 2016-08-06 MED ORDER — CYCLOBENZAPRINE HCL 10 MG PO TABS: 10.0000 mg | ORAL_TABLET | Freq: Three times a day (TID) | ORAL | 0 refills | Status: DC | PRN

## 2016-08-06 MED ORDER — OXYCODONE-ACETAMINOPHEN 5-325 MG PO TABS: 1.0000 | ORAL_TABLET | ORAL | 0 refills | Status: DC | PRN

## 2016-08-06 NOTE — MAU Provider Note (Signed)
Patient Sarah Schmitt is a 21 y.o. G2P1001  At 6382w0d here with complaints of overall pain and pelvic pain after a fall yesterday afternoon at 5 pm.   Patient denies decreased fetal movements, LOF or vaginal bleeding.   Patient has had intermittent pelvic pain and pressure throughout this pregnancy, however today it is worse. She was not able to come earlier than this afternoon because she did not have a babysitter for her son. Her [facility]  History     CSN: [id number]  Arrival date and time: 08/06/16 1442   None     Chief Complaint  Patient presents with  . Fall   Fall  The accident occurred 12 to 24 hours ago. The fall occurred while standing. She fell from a height of 3 to 5 ft. She landed on concrete. There was no blood loss. Point of impact: back. The pain is present in the back. The pain is at a severity of 8/10. The symptoms are aggravated by ambulation. Pertinent negatives include no abdominal pain, fever or vomiting.  Pelvic Pain  The patient's primary symptoms include pelvic pain. The patient's pertinent negatives include no genital itching, genital lesions, genital odor or vaginal bleeding. This is a recurrent problem. The current episode started more than 1 month ago. The problem occurs intermittently. The problem has been rapidly worsening. Associated symptoms include back pain. Pertinent negatives include no abdominal pain, chills, constipation, fever, flank pain, urgency or vomiting. The symptoms are aggravated by activity.   Patient states that she has always had pain in her pubic symphysis with her last pregnancy and with this pregnancy. She landed on her back yesterday after her fall, and when she woke up this morning the pain in her back and pubic symphysis was much worse.  OB History    Gravida Para Term Preterm AB Living   2 1 1    0 1   SAB TAB Ectopic Multiple Live Births   0 0     1      Past Medical History:   Diagnosis Date  . Medical history non-contributory     Past Surgical History:  Procedure Laterality Date  . CESAREAN SECTION      Family History  Problem Relation Age of Onset  . Diabetes Father   . Hypertension Maternal Grandmother   . Diabetes Paternal Grandmother     Social History  Substance Use Topics  . Smoking status: Former Smoker    Types: Cigarettes  . Smokeless tobacco: Never Used  . Alcohol use No    Allergies: No Known Allergies  Prescriptions Prior to Admission  Medication Sig Dispense Refill Last Dose  . acetaminophen (TYLENOL) 500 MG tablet Take 1 tablet (500 mg total) by mouth every 6 (six) hours as needed for mild pain or moderate pain. (Patient not taking: Reported on 06/28/2016) 30 tablet 0 Not Taking  . metroNIDAZOLE (FLAGYL) 500 MG tablet Take 1 tablet (500 mg total) by mouth 2 (two) times daily. 14 tablet 0   . Prenatal Vit-Fe Fumarate-FA (PREPLUS) 27-1 MG TABS Take 1 tablet by mouth daily. 30 tablet 13 Taking  . triamcinolone ointment (KENALOG) 0.5 % Apply 1 application topically 2 (two) times daily. To feet. 60 g PRN     Review of Systems  Constitutional: Negative for chills and fever.  Respiratory: Negative.   Cardiovascular: Negative.   Gastrointestinal: Negative for abdominal pain, constipation and vomiting.  Genitourinary: Positive for pelvic pain.  Negative for flank pain and urgency.  Musculoskeletal: Positive for back pain.   Physical Exam   Blood pressure (!) 118/56, pulse 79, temperature 97.9 F (36.6 C), resp. rate 16, height 5\' 4"  (1.626 m), weight 201 lb (91.2 kg), last menstrual period 11/21/2015.  Physical Exam  Constitutional: She appears well-developed and well-nourished.  HENT:  Head: Normocephalic.  Neck: Normal range of motion.  Respiratory: Effort normal.  GI: Soft.  Genitourinary:  Genitourinary Comments: NEFG; cervix is closed and long. Patient has tenderness when pubic symphsis is palpated.     MAU Course   Procedures  MDM NST: 150 bpm, no contractions, moderate variability, occasional variabilities with quick recovery.     Assessment and Plan   1. Pelvic pain affecting pregnancy in third trimester, antepartum    2. Discussed comfort measures and stretching with patient. Will send home with RX for Flexeril and percocet for pain. Patient to purchase pregnancy support belt. Recommend ice to her lower back.  3. Patient to keep her appt tomorrow at [facility], will talk with CNM Denney about comfort measures.  4. Return precautions given.   Charlesetta Garibaldi Mckinzie Saksa 08/06/2016, 4:18 PM

## 2016-08-06 NOTE — MAU Note (Addendum)
Patient presents with fall off patio down the last 3 steps, denies falling on abdomen. Having pain all over, pain in pelvic area.  Denies vaginal bleeding.

## 2016-08-06 NOTE — Discharge Instructions (Signed)
-  Perform stretches as explained; purchase pregnancy support belt, use ice for lower back -sleep with pillow between the legs

## 2016-08-06 NOTE — Progress Notes (Addendum)
G2P1 @ 37.[redacted] wksga. Presents to triage due to a fall when trying to prevent son from falling down the steps of patio yesterday. Denies falling on her abdomen and aches all over body.today. States landed on her bottom. Denies LOF or bleeding. + FM.   EFM applied. Informed pt with falls, the plan is prolong monitoring for 4 + hrs.   1541: Provider at bs assessing pt.   1735:  Discharge instructions given with pt understanding. Pt left unit via ambulatory.

## 2016-08-07 ENCOUNTER — Ambulatory Visit (INDEPENDENT_AMBULATORY_CARE_PROVIDER_SITE_OTHER): Payer: Medicaid Other | Admitting: Certified Nurse Midwife

## 2016-08-07 VITALS — BP 104/69 | HR 90 | Wt 205.0 lb

## 2016-08-07 DIAGNOSIS — O9A213 Injury, poisoning and certain other consequences of external causes complicating pregnancy, third trimester: Secondary | ICD-10-CM

## 2016-08-07 DIAGNOSIS — Z3483 Encounter for supervision of other normal pregnancy, third trimester: Secondary | ICD-10-CM

## 2016-08-07 DIAGNOSIS — O34219 Maternal care for unspecified type scar from previous cesarean delivery: Secondary | ICD-10-CM

## 2016-08-07 MED ORDER — COMFORT FIT MATERNITY SUPP LG MISC: 1.0000 [IU] | Freq: Every day | 0 refills | Status: DC

## 2016-08-07 NOTE — Progress Notes (Signed)
   PRENATAL VISIT NOTE  Subjective:  Sarah Schmitt is a 21 y.o. G2P1001 at 8564w1d being seen today for ongoing prenatal care.  She is currently monitored for the following issues for this low-risk pregnancy and has History of cesarean delivery affecting pregnancy; Antepartum bleeding, second trimester; Encounter for supervision of normal pregnancy; and Late prenatal care on her problem list.  Patient reports backache, no bleeding, no leaking and occasional contractions.  Contractions: Irritability. Vag. Bleeding: None.  Movement: Present. Denies leaking of fluid.   The following portions of the patient's history were reviewed and updated as appropriate: allergies, current medications, past family history, past medical history, past social history, past surgical history and problem list. Problem list updated.  Objective:   Vitals:   08/07/16 1609  BP: 104/69  Pulse: 90  Weight: 205 lb (93 kg)    Fetal Status: Fetal Heart Rate (bpm): 137 Fundal Height: 38 cm Movement: Present  Presentation: Vertex  General:  Alert, oriented and cooperative. Patient is in no acute distress.  Skin: Skin is warm and dry. No rash noted.   Cardiovascular: Normal heart rate noted  Respiratory: Normal respiratory effort, no problems with respiration noted  Abdomen: Soft, gravid, appropriate for gestational age. Pain/Pressure: Present     Pelvic:  Cervical exam performed Dilation: Closed Effacement (%): 0 Station: -3  Extremities: Normal range of motion.     Mental Status: Normal mood and affect. Normal behavior. Normal judgment and thought content.   Assessment and Plan:  Pregnancy: G2P1001 at 2164w1d  1. History of cesarean delivery affecting pregnancy     TOLAC planned  2. Encounter for supervision of other normal pregnancy in third trimester        3. Traumatic injury during pregnancy in third trimester      S/P fall 08/06/16, was seen in MAU - Elastic Bandages & Supports (COMFORT FIT MATERNITY  SUPP LG) MISC; 1 Units by Does not apply route daily.  Dispense: 1 each; Refill: 0  Term labor symptoms and general obstetric precautions including but not limited to vaginal bleeding, contractions, leaking of fluid and fetal movement were reviewed in detail with the patient. Please refer to After Visit Summary for other counseling recommendations.  Return in about 1 week (around 08/14/2016) for ROB.   Roe Coombsachelle A Tawanna Funk, CNM

## 2016-08-07 NOTE — Progress Notes (Signed)
Pt states she was seen in MAU yesterday due to pelvic pain. Pt was given Percocet and it has helped some. Pt states she is still having pain while walking.

## 2016-08-07 NOTE — Patient Instructions (Signed)
AREA PEDIATRIC/FAMILY PRACTICE PHYSICIANS  [facility] [address], [address] [place], [place]  [address] Phone - [phone]   Fax - [phone]  [facility] [address] [place], [place] [address] Phone - [phone]   Fax - [phone]  [facility] [address] [place], [place]  [address] Phone - [phone]   Fax - [phone]  [facility] [address] [place], [place]  [address] Phone - [phone]   Fax - [phone]  [facility] [address] [place], [place]  [address] Phone - [phone]   Fax - [phone]  [facility] [address] [place], [place]  [address] Phone - [phone]   Fax - [phone]  [facility] [address] [place], [place]  [address] Phone - [phone]   Fax - [phone]  [facility] [address], [address] [place], [place]  [address] Phone - [phone]   Fax - [phone]  [facility] [address] [place], [place]  [address] Phone - [phone]   Fax - [phone] [facility] [address] [place], [place]  [address] Phone - [phone]   Fax - [phone]  [facility] [address] [place], [place]  [address] Phone - [phone]   Fax - [phone]  [facility] [address] [place], [place]  [address] Phone - [phone]   Fax - [phone]  [facility] [address] [place], [place]  [address] Phone - [phone]   Fax - [phone]  [facility] [address] [place], [place]  [address] Phone - [phone]  [facility] [address] [place], [place]  [address] Phone - [phone]   Fax - [phone]  [facility] [address] [place], [place]  [address] Phone - [phone]   Fax - [phone]  [facility] [address] [place], [place]  [address] Phone - [phone]   Fax - [phone]  [facility] [address] [place], [place]  [address] Phone - [phone]   Fax - [phone]   [facility] [address] [place], [place]  [address] Phone - [phone]   Fax - [phone]  [facility] [address] [place], [place]  [address] Phone - [phone]   Fax - [phone]  [facility] RUB[facility] [address] , [place]  [address] Phone - [phone]   Fax - [phone]  [facility] [address] , [place]  [address] Phone - [phone]   Fax - [phone]  [facility] [address] , [place]  [address] Phone - [phone]   Fax - [phone] [facility] [address] [place], [place]  [address] Phone - [phone]   Fax - [phone]  [facility] [address] [place], [place]  [address] Phone - [phone]   Fax - [phone]  [facility] [address] [place],   [address] Phone - [phone]   Fax - [phone]  [facility] Charlene Flemming MD [address] [place] [address] [place]  [address] Phone [phone]  Fax [phone]   

## 2016-08-12 ENCOUNTER — Inpatient Hospital Stay (HOSPITAL_COMMUNITY)
Admission: AD | Admit: 2016-08-12 | Discharge: 2016-08-12 | Disposition: A | Discharge status: Home / Self Care | Payer: Medicaid Other | Source: Ambulatory Visit | Attending: Obstetrics and Gynecology | Admitting: Obstetrics and Gynecology

## 2016-08-12 ENCOUNTER — Encounter (HOSPITAL_COMMUNITY): Payer: Self-pay | Admitting: *Deleted

## 2016-08-12 DIAGNOSIS — O479 False labor, unspecified: Secondary | ICD-10-CM

## 2016-08-12 DIAGNOSIS — Z3689 Encounter for other specified antenatal screening: Secondary | ICD-10-CM

## 2016-08-12 DIAGNOSIS — Z3A37 37 weeks gestation of pregnancy: Secondary | ICD-10-CM | POA: Diagnosis not present

## 2016-08-12 DIAGNOSIS — Z87891 Personal history of nicotine dependence: Secondary | ICD-10-CM | POA: Diagnosis not present

## 2016-08-12 DIAGNOSIS — O9989 Other specified diseases and conditions complicating pregnancy, childbirth and the puerperium: Secondary | ICD-10-CM | POA: Insufficient documentation

## 2016-08-12 DIAGNOSIS — O4692 Antepartum hemorrhage, unspecified, second trimester: Secondary | ICD-10-CM

## 2016-08-12 DIAGNOSIS — R197 Diarrhea, unspecified: Secondary | ICD-10-CM | POA: Diagnosis not present

## 2016-08-12 DIAGNOSIS — O471 False labor at or after 37 completed weeks of gestation: Secondary | ICD-10-CM

## 2016-08-12 DIAGNOSIS — O26893 Other specified pregnancy related conditions, third trimester: Secondary | ICD-10-CM | POA: Diagnosis present

## 2016-08-12 DIAGNOSIS — O34219 Maternal care for unspecified type scar from previous cesarean delivery: Secondary | ICD-10-CM

## 2016-08-12 NOTE — MAU Note (Signed)
Pt reports she has been having ctx since 1830 . About 2 min apart now. Denies any vaginal bleeding or leaking . Good fetal movement reported.

## 2016-08-12 NOTE — MAU Provider Note (Signed)
History     CSN: [id number]  Arrival date and time: 08/12/16 2048   First Provider Initiated Contact with Patient 08/12/16 2137      Chief Complaint  Patient presents with  . Labor Eval   G2P1001 @37 .6 week here for labor check and reporting diarrhea today. She had 2 episodes of watery stools. No fevers. No vomiting, some nausea. No sick contacts. Has not eaten out. She is eating and drinking well. Ctx q2 min since 1830. No VB or LOF. Good FM.    OB History    Gravida Para Term Preterm AB Living   2 1 1    0 1   SAB TAB Ectopic Multiple Live Births   0 0     1      Past Medical History:  Diagnosis Date  . Medical history non-contributory     Past Surgical History:  Procedure Laterality Date  . CESAREAN SECTION      Family History  Problem Relation Age of Onset  . Diabetes Father   . Hypertension Maternal Grandmother   . Diabetes Paternal Grandmother     Social History  Substance Use Topics  . Smoking status: Former Smoker    Types: Cigarettes  . Smokeless tobacco: Never Used  . Alcohol use No    Allergies: No Known Allergies  Prescriptions Prior to Admission  Medication Sig Dispense Refill Last Dose  . acetaminophen (TYLENOL) 500 MG tablet Take 1 tablet (500 mg total) by mouth every 6 (six) hours as needed for mild pain or moderate pain. (Patient not taking: Reported on 06/28/2016) 30 tablet 0 Not Taking  . cyclobenzaprine (FLEXERIL) 10 MG tablet Take 1 tablet (10 mg total) by mouth every 8 (eight) hours as needed for muscle spasms. (Patient not taking: Reported on 08/07/2016) 20 tablet 0 Not Taking  . Elastic Bandages & Supports (COMFORT FIT MATERNITY SUPP LG) MISC 1 Units by Does not apply route daily. 1 each 0   . oxyCODONE-acetaminophen (PERCOCET/ROXICET) 5-325 MG tablet Take 1 tablet by mouth every 4 (four) hours as needed for severe pain. 5 tablet 0 Taking  . Prenatal Vit-Fe Fumarate-FA (PREPLUS) 27-1 MG TABS Take 1 tablet by mouth daily. 30 tablet 13  Taking  . triamcinolone ointment (KENALOG) 0.5 % Apply 1 application topically 2 (two) times daily. To feet. 60 g PRN     Review of Systems  Constitutional: Negative for fever.  Gastrointestinal: Positive for diarrhea and nausea. Negative for vomiting.   Physical Exam   Blood pressure 120/63, pulse (!) 103, temperature 98.2 F (36.8 C), resp. rate 18, height 5\' 4"  (1.626 m), weight 207 lb (93.9 kg), last menstrual period 11/21/2015.  Physical Exam  Constitutional: She is oriented to person, place, and time. She appears well-developed and well-nourished. No distress.  HENT:  Head: Normocephalic and atraumatic.  Neck: Normal range of motion. Neck supple.  Respiratory: Effort normal. No respiratory distress.  Musculoskeletal: Normal range of motion.  Neurological: She is alert and oriented to person, place, and time.  Skin: Skin is warm and dry.  Psychiatric: She has a normal mood and affect.   EFM: 150 bpm, mod variability, + accels, no decels Toco: irregular  MAU Course  Procedures  MDM No evidence of labor. ?viral gastroenteritis although only 2 episodes so far. Stable for discharge home.  Assessment and Plan  [redacted] weeks gestation Reactive NST False labor Diarrhea  Discharge home Follow up in OB office as scheduled Maintain hydration Imodium OTC if diarrhea continues  Call office if diarrhea persists >48 hrs Labor precautions  Donette Larry, CNM 08/12/2016, 9:38 PM

## 2016-08-12 NOTE — Discharge Instructions (Signed)
Diarrhea, Adult Diarrhea is when you have loose and water poop (stool) often. Diarrhea can make you feel weak and cause you to get dehydrated. Dehydration can make you tired and thirsty, make you have a dry mouth, and make it so you pee (urinate) less often. Diarrhea often lasts 2-3 days. However, it can last longer if it is a sign of something more serious. It is important to treat your diarrhea as told by your doctor. Follow these instructions at home: Eating and drinking  Follow these recommendations as told by your doctor:  Take an oral rehydration solution (ORS). This is a drink that is sold at pharmacies and stores.  Drink clear fluids, such as: ? Water. ? Ice chips. ? Diluted fruit juice. ? Low-calorie sports drinks.  Eat bland, easy-to-digest foods in small amounts as you are able. These foods include: ? Bananas. ? Applesauce. ? Rice. ? Low-fat (lean) meats. ? Toast. ? Crackers.  Avoid drinking fluids that have a lot of sugar or caffeine in them.  Avoid alcohol.  Avoid spicy or fatty foods.  General instructions   Drink enough fluid to keep your pee (urine) clear or pale yellow.  Wash your hands often. If you cannot use soap and water, use hand sanitizer.  Make sure that all people in your home wash their hands well and often.  Take over-the-counter and prescription medicines only as told by your doctor.  Rest at home while you get better.  Watch your condition for any changes.  Take a warm bath to help with any burning or pain from having diarrhea.  Keep all follow-up visits as told by your doctor. This is important. Contact a doctor if:  You have a fever.  Your diarrhea gets worse.  You have new symptoms.  You cannot keep fluids down.  You feel light-headed or dizzy.  You have a headache.  You have muscle cramps. Get help right away if:  You have chest pain.  You feel very weak or you pass out (faint).  You have bloody or black poop or  poop that look like tar.  You have very bad pain, cramping, or bloating in your belly (abdomen).  You have trouble breathing or you are breathing very quickly.  Your heart is beating very quickly.  Your skin feels cold and clammy.  You feel confused.  You have signs of dehydration, such as: ? Dark pee, hardly any pee, or no pee. ? Cracked lips. ? Dry mouth. ? Sunken eyes. ? Sleepiness. ? Weakness. This information is not intended to replace advice given to you by your health care provider. Make sure you discuss any questions you have with your health care provider. Document Released: 07/05/2007 Document Revised: 08/06/2015 Document Reviewed: 09/22/2014 Elsevier Interactive Patient Education  2018 [employer]. Ball CorporationBraxton Hicks Contractions Contractions of the uterus can occur throughout pregnancy, but they are not always a sign that you are in labor. You may have practice contractions called Braxton Hicks contractions. These false labor contractions are sometimes confused with true labor. What are Deberah PeltonBraxton Hicks contractions? Braxton Hicks contractions are tightening movements that occur in the muscles of the uterus before labor. Unlike true labor contractions, these contractions do not result in opening (dilation) and thinning of the cervix. Toward the end of pregnancy (32-34 weeks), Braxton Hicks contractions can happen more often and may become stronger. These contractions are sometimes difficult to tell apart from true labor because they can be very uncomfortable. You should not feel embarrassed if  you go to the hospital with false labor. Sometimes, the only way to tell if you are in true labor is for your health care provider to look for changes in the cervix. The health care provider will do a physical exam and may monitor your contractions. If you are not in true labor, the exam should show that your cervix is not dilating and your water has not broken. If there are no prenatal  problems or other health problems associated with your pregnancy, it is completely safe for you to be sent home with false labor. You may continue to have Braxton Hicks contractions until you go into true labor. How can I tell the difference between true labor and false labor?  Differences ? False labor ? Contractions last 30-70 seconds.: Contractions are usually shorter and not as strong as true labor contractions. ? Contractions become very regular.: Contractions are usually irregular. ? Discomfort is usually felt in the top of the uterus, and it spreads to the lower abdomen and low back.: Contractions are often felt in the front of the lower abdomen and in the groin. ? Contractions do not go away with walking.: Contractions may go away when you walk around or change positions while lying down. ? Contractions usually become more intense and increase in frequency.: Contractions get weaker and are shorter-lasting as time goes on. ? The cervix dilates and gets thinner.: The cervix usually does not dilate or become thin. Follow these instructions at home:  Take over-the-counter and prescription medicines only as told by your health care provider.  Keep up with your usual exercises and follow other instructions from your health care provider.  Eat and drink lightly if you think you are going into labor.  If Braxton Hicks contractions are making you uncomfortable: ? Change your position from lying down or resting to walking, or change from walking to resting. ? Sit and rest in a tub of warm water. ? Drink enough fluid to keep your urine clear or pale yellow. Dehydration may cause these contractions. ? Do slow and deep breathing several times an hour.  Keep all follow-up prenatal visits as told by your health care provider. This is important. Contact a health care provider if:  You have a fever.  You have continuous pain in your abdomen. Get help right away if:  Your contractions become  stronger, more regular, and closer together.  You have fluid leaking or gushing from your vagina.  You pass blood-tinged mucus (bloody show).  You have bleeding from your vagina.  You have low back pain that you never had before.  You feel your babys head pushing down and causing pelvic pressure.  Your baby is not moving inside you as much as it used to. Summary  Contractions that occur before labor are called Braxton Hicks contractions, false labor, or practice contractions.  Braxton Hicks contractions are usually shorter, weaker, farther apart, and less regular than true labor contractions. True labor contractions usually become progressively stronger and regular and they become more frequent.  Manage discomfort from [facility] contractions by changing position, resting in a warm bath, drinking plenty of water, or practicing deep breathing. This information is not intended to replace advice given to you by your health care provider. Make sure you discuss any questions you have with your health care provider. Document Released: 01/16/2005 Document Revised: 12/06/2015 Document Reviewed: 12/06/2015 Elsevier Interactive Patient Education  2017 [employer].

## 2016-08-15 ENCOUNTER — Institutional Professional Consult (permissible substitution): Payer: Self-pay | Admitting: Pediatrics

## 2016-08-16 ENCOUNTER — Encounter: Payer: Medicaid Other | Admitting: Certified Nurse Midwife

## 2016-08-18 ENCOUNTER — Inpatient Hospital Stay (HOSPITAL_COMMUNITY)
Admission: AD | Admit: 2016-08-18 | Discharge: 2016-08-18 | Disposition: A | Discharge status: Home / Self Care | Payer: Medicaid Other | Source: Ambulatory Visit | Attending: Obstetrics and Gynecology | Admitting: Obstetrics and Gynecology

## 2016-08-18 ENCOUNTER — Telehealth: Payer: Self-pay | Admitting: *Deleted

## 2016-08-18 ENCOUNTER — Encounter (HOSPITAL_COMMUNITY): Payer: Self-pay

## 2016-08-18 DIAGNOSIS — O471 False labor at or after 37 completed weeks of gestation: Secondary | ICD-10-CM | POA: Diagnosis present

## 2016-08-18 DIAGNOSIS — O479 False labor, unspecified: Secondary | ICD-10-CM

## 2016-08-18 DIAGNOSIS — Z3A38 38 weeks gestation of pregnancy: Secondary | ICD-10-CM | POA: Diagnosis not present

## 2016-08-18 MED ORDER — ZOLPIDEM TARTRATE 5 MG PO TABS
5.0000 mg | ORAL_TABLET | Freq: Once | ORAL | Status: AC
  Administered 2016-08-18: 5 mg via ORAL
  Filled 2016-08-18: qty 1

## 2016-08-18 NOTE — Telephone Encounter (Signed)
Patient requested call back. 12:00 Called patient back- LM - need to return to MAU if her contractions are worse or she feels she needs to be checked again.

## 2016-08-18 NOTE — MAU Provider Note (Signed)
[name] is a 21 y.o. G2P1001 at 5565w5d who present to MAU today with complaint of "back-to-back" contractions since 2200 last night. She denies vaginal bleeding or LOF. She reports good fetal movement.   BP 126/70 (BP Location: Left Arm)   Pulse 83   Temp 98.1 F (36.7 C) (Oral)   Resp 18   LMP 11/21/2015  Physical Exam  Nursing note and vitals reviewed. Constitutional: She is oriented to person, place, and time. She appears well-developed and well-nourished. No distress.  HENT:  Head: Normocephalic and atraumatic.  Cardiovascular: Normal rate.   Respiratory: Effort normal.  GI: Soft. She exhibits no distension and no mass. There is no tenderness. There is no rebound and no guarding.  Neurological: She is alert and oriented to person, place, and time.  Skin: Skin is warm and dry. No erythema.  Psychiatric: She has a normal mood and affect.  Dilation: Fingertip Effacement (%): Thick Cervical Position: Posterior Exam by:: Burnadette PeterLynnette Weston, RN Exam confirmed by Vonzella NippleJulie Joby Hershkowitz, PA-C  Fetal Monitoring: Baseline: 140 bpm Variability: moderate Accelerations: 15 x 15 Decelerations: none Contractions: q 3-6 minutes   MAU course Patient was checked by 2 RNs and insisted on another exam prior to discharge. I checked the patient and agree with the exam noted above. Patient requested something to help her sleep. 5 mg Ambien given prior to discharge.    A:  SIUP at 3365w5d Braxton hicks contractions   P:  Discharge home in stable condition  Labor precautions discussed Patient advised to follow-up with [facility] as planned for routine prenatal care  Patient may return to MAU as needed or if her condition were to change or worsen  Marny LowensteinWenzel, Sharyl Panchal N, PA-C 08/18/2016 2:02 AM

## 2016-08-18 NOTE — MAU Note (Signed)
I have communicated with Vonzella NippleJulie Wenzel, PA and reviewed vital signs:  Vitals:   08/18/16 0111 08/18/16 0226  BP: 126/70 114/65  Pulse: 83   Resp: 18   Temp: 98.1 F (36.7 C)     Vaginal exam:  Dilation: Fingertip Effacement (%): Thick Cervical Position: Posterior Exam by:: Arnetha GulaJuilie Wenzel, RN,   Also reviewed contraction pattern and that non-stress test is reactive.  It has been documented that patient is contracting every 2-6 minutes with no cervical change over 1 hours not indicating active labor.  Patient denies any other complaints.  Based on this report provider has given order for discharge.  A discharge order and diagnosis entered by a provider.   Labor discharge instructions reviewed with patient.

## 2016-08-18 NOTE — MAU Note (Signed)
Pt states that she started having contractions at 1900. +Fm, no vaginal bleeding or discharge.

## 2016-08-18 NOTE — Discharge Instructions (Signed)
Braxton Hicks Contractions °Contractions of the uterus can occur throughout pregnancy, but they are not always a sign that you are in labor. You may have practice contractions called Braxton Hicks contractions. These false labor contractions are sometimes confused with true labor. °What are Braxton Hicks contractions? °Braxton Hicks contractions are tightening movements that occur in the muscles of the uterus before labor. Unlike true labor contractions, these contractions do not result in opening (dilation) and thinning of the cervix. Toward the end of pregnancy (32-34 weeks), Braxton Hicks contractions can happen more often and may become stronger. These contractions are sometimes difficult to tell apart from true labor because they can be very uncomfortable. You should not feel embarrassed if you go to the hospital with false labor. °Sometimes, the only way to tell if you are in true labor is for your health care provider to look for changes in the cervix. The health care provider will do a physical exam and may monitor your contractions. If you are not in true labor, the exam should show that your cervix is not dilating and your water has not broken. °If there are no prenatal problems or other health problems associated with your pregnancy, it is completely safe for you to be sent home with false labor. You may continue to have Braxton Hicks contractions until you go into true labor. °How can I tell the difference between true labor and false labor? °· Differences °? False labor °? Contractions last 30-70 seconds.: Contractions are usually shorter and not as strong as true labor contractions. °? Contractions become very regular.: Contractions are usually irregular. °? Discomfort is usually felt in the top of the uterus, and it spreads to the lower abdomen and low back.: Contractions are often felt in the front of the lower abdomen and in the groin. °? Contractions do not go away with walking.: Contractions may  go away when you walk around or change positions while lying down. °? Contractions usually become more intense and increase in frequency.: Contractions get weaker and are shorter-lasting as time goes on. °? The cervix dilates and gets thinner.: The cervix usually does not dilate or become thin. °Follow these instructions at home: °· Take over-the-counter and prescription medicines only as told by your health care provider. °· Keep up with your usual exercises and follow other instructions from your health care provider. °· Eat and drink lightly if you think you are going into labor. °· If Braxton Hicks contractions are making you uncomfortable: °? Change your position from lying down or resting to walking, or change from walking to resting. °? Sit and rest in a tub of warm water. °? Drink enough fluid to keep your urine clear or pale yellow. Dehydration may cause these contractions. °? Do slow and deep breathing several times an hour. °· Keep all follow-up prenatal visits as told by your health care provider. This is important. °Contact a health care provider if: °· You have a fever. °· You have continuous pain in your abdomen. °Get help right away if: °· Your contractions become stronger, more regular, and closer together. °· You have fluid leaking or gushing from your vagina. °· You pass blood-tinged mucus (bloody show). °· You have bleeding from your vagina. °· You have low back pain that you never had before. °· You feel your baby’s head pushing down and causing pelvic pressure. °· Your baby is not moving inside you as much as it used to. °Summary °· Contractions that occur before labor are   called Braxton Hicks contractions, false labor, or practice contractions. °· Braxton Hicks contractions are usually shorter, weaker, farther apart, and less regular than true labor contractions. True labor contractions usually become progressively stronger and regular and they become more frequent. °· Manage discomfort from  Braxton Hicks contractions by changing position, resting in a warm bath, drinking plenty of water, or practicing deep breathing. °This information is not intended to replace advice given to you by your health care provider. Make sure you discuss any questions you have with your health care provider. °Document Released: 01/16/2005 Document Revised: 12/06/2015 Document Reviewed: 12/06/2015 °Elsevier Interactive Patient Education © 2017 [employer] ° ° °Fetal Movement Counts °Patient Name: ________________________________________________ Patient Due Date: ____________________ °What is a fetal movement count? °A fetal movement count is the number of times that you feel your baby move during a certain amount of time. This may also be called a fetal kick count. A fetal movement count is recommended for every pregnant woman. You may be asked to start counting fetal movements as early as week 28 of your pregnancy. °Pay attention to when your baby is most active. You may notice your baby's sleep and wake cycles. You may also notice things that make your baby move more. You should do a fetal movement count: °· When your baby is normally most active. °· At the same time each day. ° °A good time to count movements is while you are resting, after having something to eat and drink. °How do I count fetal movements? °1. Find a quiet, comfortable area. Sit, or lie down on your side. °2. Write down the date, the start time and stop time, and the number of movements that you felt between those two times. Take this information with you to your health care visits. °3. For 2 hours, count kicks, flutters, swishes, rolls, and jabs. You should feel at least 10 movements during 2 hours. °4. You may stop counting after you have felt 10 movements. °5. If you do not feel 10 movements in 2 hours, have something to eat and drink. Then, keep resting and counting for 1 hour. If you feel at least 4 movements during that hour, you may stop  counting. °Contact a health care provider if: °· You feel fewer than 4 movements in 2 hours. °· Your baby is not moving like he or she usually does. °Date: ____________ Start time: ____________ Stop time: ____________ Movements: ____________ °Date: ____________ Start time: ____________ Stop time: ____________ Movements: ____________ °Date: ____________ Start time: ____________ Stop time: ____________ Movements: ____________ °Date: ____________ Start time: ____________ Stop time: ____________ Movements: ____________ °Date: ____________ Start time: ____________ Stop time: ____________ Movements: ____________ °Date: ____________ Start time: ____________ Stop time: ____________ Movements: ____________ °Date: ____________ Start time: ____________ Stop time: ____________ Movements: ____________ °Date: ____________ Start time: ____________ Stop time: ____________ Movements: ____________ °Date: ____________ Start time: ____________ Stop time: ____________ Movements: ____________ °This information is not intended to replace advice given to you by your health care provider. Make sure you discuss any questions you have with your health care provider. °Document Released: 02/15/2006 Document Revised: 09/15/2015 Document Reviewed: 02/25/2015 °Elsevier Interactive Patient Education © 2018 [employer] ° °

## 2016-08-21 ENCOUNTER — Telehealth: Payer: Self-pay

## 2016-08-21 NOTE — Telephone Encounter (Signed)
Returned call, patient stated that she no longer needs assistance, and stated that she has an appt on Wed and would follow up with provider.

## 2016-08-23 ENCOUNTER — Ambulatory Visit (INDEPENDENT_AMBULATORY_CARE_PROVIDER_SITE_OTHER): Payer: Medicaid Other | Admitting: Certified Nurse Midwife

## 2016-08-23 VITALS — BP 112/73 | HR 106 | Wt 213.0 lb

## 2016-08-23 DIAGNOSIS — O34219 Maternal care for unspecified type scar from previous cesarean delivery: Secondary | ICD-10-CM

## 2016-08-23 DIAGNOSIS — Z3483 Encounter for supervision of other normal pregnancy, third trimester: Secondary | ICD-10-CM

## 2016-08-23 NOTE — Progress Notes (Signed)
Pt would like cervix check today.  

## 2016-08-24 ENCOUNTER — Encounter: Payer: Self-pay | Admitting: Certified Nurse Midwife

## 2016-08-24 NOTE — Progress Notes (Signed)
   PRENATAL VISIT NOTE  Subjective:  Sarah Schmitt is a 21 y.o. G2P1001 at 7555w4d being seen today for ongoing prenatal care.  She is currently monitored for the following issues for this low-risk pregnancy and has History of cesarean delivery affecting pregnancy; Antepartum bleeding, second trimester; Encounter for supervision of normal pregnancy; and Late prenatal care on her problem list.  Patient reports backache, no bleeding, no leaking and occasional contractions.  Contractions: Irregular. Vag. Bleeding: None.  Movement: Present. Denies leaking of fluid.   The following portions of the patient's history were reviewed and updated as appropriate: allergies, current medications, past family history, past medical history, past social history, past surgical history and problem list. Problem list updated.  Objective:   Vitals:   08/23/16 1523  BP: 112/73  Pulse: (!) 106  Weight: 213 lb (96.6 kg)    Fetal Status: Fetal Heart Rate (bpm): 136 Fundal Height: 41 cm Movement: Present  Presentation: Vertex  General:  Alert, oriented and cooperative. Patient is in no acute distress.  Skin: Skin is warm and dry. No rash noted.   Cardiovascular: Normal heart rate noted  Respiratory: Normal respiratory effort, no problems with respiration noted  Abdomen: Soft, gravid, appropriate for gestational age.  Pain/Pressure: Present     Pelvic: Cervical exam performed Dilation: Fingertip   Station: Ballotable  Extremities: Normal range of motion.     Mental Status:  Normal mood and affect. Normal behavior. Normal judgment and thought content.   Assessment and Plan:  Pregnancy: G2P1001 at 5255w4d  1. Encounter for supervision of other normal pregnancy in third trimester     Cervix unchanged from previous exams.  Patient discussed her desire for repeat C-section.  Patient states that she would like a repeat C-section at this time and has requested Monday.  Attending Dr. Rande LawmanErwin made aware of patients  change of mind and desire for repeat C-section.  Repeat C-section scheduled per patient request for 08/28/16.    2. History of cesarean delivery affecting pregnancy     Declines TOLAC at this time.   Term labor symptoms and general obstetric precautions including but not limited to vaginal bleeding, contractions, leaking of fluid and fetal movement were reviewed in detail with the patient. Please refer to After Visit Summary for other counseling recommendations.  Return in about 4 weeks (around 09/20/2016) for Postpartum.   Roe Coombsachelle A Angelis Gates, CNM

## 2016-08-25 ENCOUNTER — Encounter (HOSPITAL_COMMUNITY): Payer: Self-pay

## 2016-08-25 ENCOUNTER — Encounter (HOSPITAL_COMMUNITY)
Admission: RE | Admit: 2016-08-25 | Discharge: 2016-08-25 | Disposition: A | Discharge status: Home / Self Care | Payer: Medicaid Other | Source: Ambulatory Visit

## 2016-08-25 NOTE — Patient Instructions (Signed)
[address] Ole[address] Mouseiner[address] M Morrissey  08/25/2016   Your procedure is scheduled on:  08/28/2016  [facility] at 1:30 PM.  Pick up the phone at the desk and dial [phone].   Call this number if you have problems the morning of surgery: [phone]   Remember:   Do not eat food:After Midnight.  Do not drink clear liquids: 6 Hours before arrival.  Take these medicines the morning of surgery with A SIP OF WATER: none   Do not wear jewelry, make-up or nail polish.  Do not wear lotions, powders, or perfumes. Do not wear deodorant.  Do not shave 48 hours prior to surgery.  Do not bring valuables to the hospital.  [facility] is not   responsible for any belongings or valuables brought to the hospital.  Contacts, dentures or bridgework may not be worn into surgery.  Leave suitcase in the car. After surgery it may be brought to your room.  For patients admitted to the hospital, checkout time is 11:00 AM the day of              discharge.   Patients discharged the day of surgery will not be allowed to drive             home.  Name and phone number of your driver: na  Special Instructions:   N/A   Please read over the following fact sheets that you were given:   Surgical Site Infection Prevention

## 2016-08-28 ENCOUNTER — Encounter (HOSPITAL_COMMUNITY): Payer: Self-pay | Admitting: Obstetrics & Gynecology

## 2016-08-28 ENCOUNTER — Inpatient Hospital Stay (HOSPITAL_COMMUNITY): Payer: Medicaid Other | Admitting: Certified Registered Nurse Anesthetist

## 2016-08-28 ENCOUNTER — Inpatient Hospital Stay (HOSPITAL_COMMUNITY)
Admission: AD | Admit: 2016-08-28 | Discharge: 2016-08-31 | DRG: 766 | Disposition: A | Discharge status: Home / Self Care | Payer: Medicaid Other | Source: Ambulatory Visit | Attending: Obstetrics & Gynecology | Admitting: Obstetrics & Gynecology

## 2016-08-28 ENCOUNTER — Encounter (HOSPITAL_COMMUNITY)
Admission: AD | Disposition: A | Discharge status: Home / Self Care | Payer: Self-pay | Source: Ambulatory Visit | Attending: Obstetrics & Gynecology

## 2016-08-28 DIAGNOSIS — Z87891 Personal history of nicotine dependence: Secondary | ICD-10-CM | POA: Diagnosis not present

## 2016-08-28 DIAGNOSIS — O0930 Supervision of pregnancy with insufficient antenatal care, unspecified trimester: Secondary | ICD-10-CM

## 2016-08-28 DIAGNOSIS — O34219 Maternal care for unspecified type scar from previous cesarean delivery: Secondary | ICD-10-CM | POA: Diagnosis present

## 2016-08-28 DIAGNOSIS — O34211 Maternal care for low transverse scar from previous cesarean delivery: Principal | ICD-10-CM | POA: Diagnosis present

## 2016-08-28 DIAGNOSIS — Z3A40 40 weeks gestation of pregnancy: Secondary | ICD-10-CM

## 2016-08-28 LAB — CBC
HCT: 32.4 % — ABNORMAL LOW (ref 36.0–46.0)
HEMOGLOBIN: 10.7 g/dL — AB (ref 12.0–15.0)
MCH: 29.4 pg (ref 26.0–34.0)
MCHC: 33 g/dL (ref 30.0–36.0)
MCV: 89 fL (ref 78.0–100.0)
Platelets: 267 10*3/uL (ref 150–400)
RBC: 3.64 MIL/uL — AB (ref 3.87–5.11)
RDW: 14.7 % (ref 11.5–15.5)
WBC: 9 10*3/uL (ref 4.0–10.5)

## 2016-08-28 LAB — TYPE AND SCREEN
ABO/RH(D): O POS
ANTIBODY SCREEN: NEGATIVE

## 2016-08-28 LAB — ABO/RH: ABO/RH(D): O POS

## 2016-08-28 SURGERY — Surgical Case
Anesthesia: Spinal

## 2016-08-28 MED ORDER — SIMETHICONE 80 MG PO CHEW: 80.0000 mg | CHEWABLE_TABLET | ORAL | Status: DC | PRN

## 2016-08-28 MED ORDER — SIMETHICONE 80 MG PO CHEW
80.0000 mg | CHEWABLE_TABLET | Freq: Three times a day (TID) | ORAL | Status: DC
  Administered 2016-08-29 – 2016-08-31 (×5): 80 mg via ORAL
  Filled 2016-08-28 (×7): qty 1

## 2016-08-28 MED ORDER — LACTATED RINGERS IV SOLN
INTRAVENOUS | Status: DC | PRN
  Administered 2016-08-28: 16:00:00 via INTRAVENOUS

## 2016-08-28 MED ORDER — NALBUPHINE HCL 10 MG/ML IJ SOLN: 5.0000 mg | Freq: Once | INTRAMUSCULAR | Status: DC | PRN

## 2016-08-28 MED ORDER — SENNOSIDES-DOCUSATE SODIUM 8.6-50 MG PO TABS
2.0000 | ORAL_TABLET | ORAL | Status: DC
  Administered 2016-08-28 – 2016-08-31 (×3): 2 via ORAL
  Filled 2016-08-28 (×3): qty 2

## 2016-08-28 MED ORDER — PHENYLEPHRINE 8 MG IN D5W 100 ML (0.08MG/ML) PREMIX OPTIME
INJECTION | INTRAVENOUS | Status: DC | PRN
  Administered 2016-08-28: 60 ug/min via INTRAVENOUS

## 2016-08-28 MED ORDER — PROMETHAZINE HCL 25 MG/ML IJ SOLN: 6.2500 mg | INTRAMUSCULAR | Status: DC | PRN

## 2016-08-28 MED ORDER — SCOPOLAMINE 1 MG/3DAYS TD PT72
1.0000 | MEDICATED_PATCH | Freq: Once | TRANSDERMAL | Status: DC
  Filled 2016-08-28: qty 1

## 2016-08-28 MED ORDER — BUPIVACAINE IN DEXTROSE 0.75-8.25 % IT SOLN
INTRATHECAL | Status: DC | PRN
  Administered 2016-08-28: 1.4 mL via INTRATHECAL

## 2016-08-28 MED ORDER — FENTANYL CITRATE (PF) 100 MCG/2ML IJ SOLN
INTRAMUSCULAR | Status: AC
  Filled 2016-08-28: qty 2

## 2016-08-28 MED ORDER — NALBUPHINE HCL 10 MG/ML IJ SOLN: 5.0000 mg | INTRAMUSCULAR | Status: DC | PRN

## 2016-08-28 MED ORDER — OXYTOCIN 40 UNITS IN LACTATED RINGERS INFUSION - SIMPLE MED: 2.5000 [IU]/h | INTRAVENOUS | Status: AC

## 2016-08-28 MED ORDER — LACTATED RINGERS IV SOLN
INTRAVENOUS | Status: DC
  Administered 2016-08-28 – 2016-08-29 (×2): via INTRAVENOUS

## 2016-08-28 MED ORDER — ONDANSETRON HCL 4 MG/2ML IJ SOLN
INTRAMUSCULAR | Status: DC | PRN
  Administered 2016-08-28: 4 mg via INTRAVENOUS

## 2016-08-28 MED ORDER — NALOXONE HCL 2 MG/2ML IJ SOSY
1.0000 ug/kg/h | PREFILLED_SYRINGE | INTRAVENOUS | Status: DC | PRN
  Filled 2016-08-28: qty 2

## 2016-08-28 MED ORDER — MORPHINE SULFATE (PF) 0.5 MG/ML IJ SOLN
INTRAMUSCULAR | Status: AC
  Filled 2016-08-28: qty 10

## 2016-08-28 MED ORDER — KETOROLAC TROMETHAMINE 30 MG/ML IJ SOLN
INTRAMUSCULAR | Status: AC
  Filled 2016-08-28: qty 1

## 2016-08-28 MED ORDER — HYDROMORPHONE HCL 1 MG/ML IJ SOLN: 0.2500 mg | INTRAMUSCULAR | Status: DC | PRN

## 2016-08-28 MED ORDER — MEPERIDINE HCL 25 MG/ML IJ SOLN: 6.2500 mg | INTRAMUSCULAR | Status: DC | PRN

## 2016-08-28 MED ORDER — DIBUCAINE 1 % RE OINT: 1.0000 "application " | TOPICAL_OINTMENT | RECTAL | Status: DC | PRN

## 2016-08-28 MED ORDER — ONDANSETRON HCL 4 MG/2ML IJ SOLN
INTRAMUSCULAR | Status: AC
  Filled 2016-08-28: qty 2

## 2016-08-28 MED ORDER — OXYTOCIN 10 UNIT/ML IJ SOLN
INTRAMUSCULAR | Status: AC
  Filled 2016-08-28: qty 4

## 2016-08-28 MED ORDER — DIPHENHYDRAMINE HCL 50 MG/ML IJ SOLN
INTRAMUSCULAR | Status: AC
  Filled 2016-08-28: qty 1

## 2016-08-28 MED ORDER — TETANUS-DIPHTH-ACELL PERTUSSIS 5-2.5-18.5 LF-MCG/0.5 IM SUSP: 0.5000 mL | Freq: Once | INTRAMUSCULAR | Status: DC

## 2016-08-28 MED ORDER — ACETAMINOPHEN 500 MG PO TABS
1000.0000 mg | ORAL_TABLET | Freq: Four times a day (QID) | ORAL | Status: AC
  Administered 2016-08-29 (×2): 1000 mg via ORAL
  Filled 2016-08-28 (×2): qty 2

## 2016-08-28 MED ORDER — BUPIVACAINE HCL (PF) 0.5 % IJ SOLN
INTRAMUSCULAR | Status: AC
  Filled 2016-08-28: qty 30

## 2016-08-28 MED ORDER — SODIUM CHLORIDE 0.9% FLUSH: 3.0000 mL | INTRAVENOUS | Status: DC | PRN

## 2016-08-28 MED ORDER — KETOROLAC TROMETHAMINE 30 MG/ML IJ SOLN: 30.0000 mg | Freq: Four times a day (QID) | INTRAMUSCULAR | Status: AC | PRN

## 2016-08-28 MED ORDER — BUPIVACAINE IN DEXTROSE 0.75-8.25 % IT SOLN
INTRATHECAL | Status: AC
  Filled 2016-08-28: qty 2

## 2016-08-28 MED ORDER — KETOROLAC TROMETHAMINE 30 MG/ML IJ SOLN
30.0000 mg | Freq: Once | INTRAMUSCULAR | Status: DC | PRN
  Administered 2016-08-28: 30 mg via INTRAVENOUS

## 2016-08-28 MED ORDER — SODIUM CHLORIDE 0.9 % IR SOLN
Status: DC | PRN
  Administered 2016-08-28: 1000 mL

## 2016-08-28 MED ORDER — DIPHENHYDRAMINE HCL 50 MG/ML IJ SOLN
12.5000 mg | INTRAMUSCULAR | Status: DC | PRN
  Administered 2016-08-28: 12.5 mg via INTRAVENOUS

## 2016-08-28 MED ORDER — PRENATAL MULTIVITAMIN CH
1.0000 | ORAL_TABLET | Freq: Every day | ORAL | Status: DC
  Administered 2016-08-29 – 2016-08-31 (×3): 1 via ORAL
  Filled 2016-08-28 (×3): qty 1

## 2016-08-28 MED ORDER — FENTANYL CITRATE (PF) 100 MCG/2ML IJ SOLN
INTRAMUSCULAR | Status: DC | PRN
  Administered 2016-08-28: 20 ug via INTRATHECAL

## 2016-08-28 MED ORDER — LACTATED RINGERS IV SOLN
INTRAVENOUS | Status: DC
  Administered 2016-08-28 (×2): via INTRAVENOUS

## 2016-08-28 MED ORDER — DIPHENHYDRAMINE HCL 25 MG PO CAPS
25.0000 mg | ORAL_CAPSULE | ORAL | Status: DC | PRN
  Filled 2016-08-28: qty 1

## 2016-08-28 MED ORDER — WITCH HAZEL-GLYCERIN EX PADS: 1.0000 "application " | MEDICATED_PAD | CUTANEOUS | Status: DC | PRN

## 2016-08-28 MED ORDER — OXYTOCIN 10 UNIT/ML IJ SOLN
INTRAVENOUS | Status: DC | PRN
  Administered 2016-08-28: 40 [IU] via INTRAVENOUS

## 2016-08-28 MED ORDER — DIPHENHYDRAMINE HCL 25 MG PO CAPS: 25.0000 mg | ORAL_CAPSULE | Freq: Four times a day (QID) | ORAL | Status: DC | PRN

## 2016-08-28 MED ORDER — CEFAZOLIN SODIUM-DEXTROSE 2-4 GM/100ML-% IV SOLN
2.0000 g | INTRAVENOUS | Status: AC
  Administered 2016-08-28: 2 g via INTRAVENOUS
  Filled 2016-08-28: qty 100

## 2016-08-28 MED ORDER — NALOXONE HCL 0.4 MG/ML IJ SOLN: 0.4000 mg | INTRAMUSCULAR | Status: DC | PRN

## 2016-08-28 MED ORDER — SCOPOLAMINE 1 MG/3DAYS TD PT72
MEDICATED_PATCH | TRANSDERMAL | Status: AC
  Filled 2016-08-28: qty 1

## 2016-08-28 MED ORDER — ZOLPIDEM TARTRATE 5 MG PO TABS: 5.0000 mg | ORAL_TABLET | Freq: Every evening | ORAL | Status: DC | PRN

## 2016-08-28 MED ORDER — BUPIVACAINE HCL (PF) 0.5 % IJ SOLN
INTRAMUSCULAR | Status: DC | PRN
  Administered 2016-08-28: 30 mL

## 2016-08-28 MED ORDER — MORPHINE SULFATE (PF) 0.5 MG/ML IJ SOLN
INTRAMUSCULAR | Status: DC | PRN
  Administered 2016-08-28: .2 mg via INTRATHECAL

## 2016-08-28 MED ORDER — SCOPOLAMINE 1 MG/3DAYS TD PT72
1.0000 | MEDICATED_PATCH | Freq: Once | TRANSDERMAL | Status: AC
  Administered 2016-08-28: 1.5 mg via TRANSDERMAL

## 2016-08-28 MED ORDER — ACETAMINOPHEN 325 MG PO TABS: 650.0000 mg | ORAL_TABLET | ORAL | Status: DC | PRN

## 2016-08-28 MED ORDER — MENTHOL 3 MG MT LOZG: 1.0000 | LOZENGE | OROMUCOSAL | Status: DC | PRN

## 2016-08-28 MED ORDER — COCONUT OIL OIL
1.0000 "application " | TOPICAL_OIL | Status: DC | PRN
  Filled 2016-08-28: qty 120

## 2016-08-28 MED ORDER — ONDANSETRON HCL 4 MG/2ML IJ SOLN: 4.0000 mg | Freq: Three times a day (TID) | INTRAMUSCULAR | Status: DC | PRN

## 2016-08-28 MED ORDER — SIMETHICONE 80 MG PO CHEW
80.0000 mg | CHEWABLE_TABLET | ORAL | Status: DC
  Administered 2016-08-28 – 2016-08-31 (×3): 80 mg via ORAL
  Filled 2016-08-28 (×3): qty 1

## 2016-08-28 MED ORDER — PHENYLEPHRINE 8 MG IN D5W 100 ML (0.08MG/ML) PREMIX OPTIME
INJECTION | INTRAVENOUS | Status: AC
  Filled 2016-08-28: qty 100

## 2016-08-28 MED ORDER — IBUPROFEN 600 MG PO TABS
600.0000 mg | ORAL_TABLET | Freq: Four times a day (QID) | ORAL | Status: DC
  Administered 2016-08-29 – 2016-08-31 (×9): 600 mg via ORAL
  Filled 2016-08-28 (×11): qty 1

## 2016-08-28 SURGICAL SUPPLY — 39 items
BARRIER ADHS 3X4 INTERCEED (GAUZE/BANDAGES/DRESSINGS) IMPLANT
CHLORAPREP W/TINT 26ML (MISCELLANEOUS) ×3 IMPLANT
CLAMP CORD UMBIL (MISCELLANEOUS) IMPLANT
CLOSURE WOUND 1/4 X3 (GAUZE/BANDAGES/DRESSINGS) ×1
CLOTH BEACON ORANGE TIMEOUT ST (SAFETY) ×3 IMPLANT
DRSG OPSITE POSTOP 4X10 (GAUZE/BANDAGES/DRESSINGS) ×3 IMPLANT
ELECT REM PT RETURN 9FT ADLT (ELECTROSURGICAL) ×3
ELECTRODE REM PT RTRN 9FT ADLT (ELECTROSURGICAL) ×1 IMPLANT
EXTRACTOR VACUUM KIWI (MISCELLANEOUS) ×3 IMPLANT
GAUZE SPONGE 4X4 12PLY STRL (GAUZE/BANDAGES/DRESSINGS) ×6 IMPLANT
GLOVE BIO SURGEON STRL SZ 6.5 (GLOVE) ×2 IMPLANT
GLOVE BIO SURGEONS STRL SZ 6.5 (GLOVE) ×1
GLOVE BIOGEL PI IND STRL 7.0 (GLOVE) ×2 IMPLANT
GLOVE BIOGEL PI IND STRL 7.5 (GLOVE) ×1 IMPLANT
GLOVE BIOGEL PI INDICATOR 7.0 (GLOVE) ×4
GLOVE BIOGEL PI INDICATOR 7.5 (GLOVE) ×2
GLOVE SURG SS PI 7.0 STRL IVOR (GLOVE) ×3 IMPLANT
GOWN STRL REUS W/TWL LRG LVL3 (GOWN DISPOSABLE) ×6 IMPLANT
KIT ABG SYR 3ML LUER SLIP (SYRINGE) ×3 IMPLANT
NEEDLE HYPO 22GX1.5 SAFETY (NEEDLE) IMPLANT
NEEDLE HYPO 25X5/8 SAFETYGLIDE (NEEDLE) ×3 IMPLANT
NS IRRIG 1000ML POUR BTL (IV SOLUTION) ×3 IMPLANT
PACK C SECTION WH (CUSTOM PROCEDURE TRAY) ×3 IMPLANT
PAD ABD 7.5X8 STRL (GAUZE/BANDAGES/DRESSINGS) ×3 IMPLANT
PAD OB MATERNITY 4.3X12.25 (PERSONAL CARE ITEMS) ×3 IMPLANT
PENCIL SMOKE EVAC W/HOLSTER (ELECTROSURGICAL) ×3 IMPLANT
RETRACTOR WND ALEXIS 25 LRG (MISCELLANEOUS) IMPLANT
RTRCTR WOUND ALEXIS 25CM LRG (MISCELLANEOUS)
SPONGE GAUZE 4X4 12PLY (GAUZE/BANDAGES/DRESSINGS) ×4 IMPLANT
STRIP CLOSURE SKIN 1/4X3 (GAUZE/BANDAGES/DRESSINGS) ×2 IMPLANT
SUT PLAIN 2 0 (SUTURE) ×2
SUT PLAIN ABS 2-0 CT1 27XMFL (SUTURE) ×1 IMPLANT
SUT VIC AB 0 CT1 36 (SUTURE) ×18 IMPLANT
SUT VIC AB 2-0 CT1 27 (SUTURE) ×2
SUT VIC AB 2-0 CT1 TAPERPNT 27 (SUTURE) ×1 IMPLANT
SUT VIC AB 4-0 PS2 27 (SUTURE) ×3 IMPLANT
SYR CONTROL 10ML LL (SYRINGE) IMPLANT
TOWEL OR 17X24 6PK STRL BLUE (TOWEL DISPOSABLE) ×6 IMPLANT
TRAY FOLEY BAG SILVER LF 14FR (SET/KITS/TRAYS/PACK) IMPLANT

## 2016-08-28 NOTE — Op Note (Signed)
Sarah Schmitt PROCEDURE DATE: 08/28/2016  PREOPERATIVE DIAGNOSES: Intrauterine pregnancy at 6645w1d weeks gestation; previous uterine incision low transverse  POSTOPERATIVE DIAGNOSES: The same  PROCEDURE: Repeat Low Transverse Cesarean Section  SURGEON:  Adam PhenixArnold, James G, MD  ASSISTANT:  RNFA  ANESTHESIOLOGY TEAM: Anesthesiologist: Leilani AbleHatchett, Franklin, MD CRNA: Elgie CongoMalinova, Nataliya H, CRNA  INDICATIONS: Sarah Schmitt is a 21 y.o. G2P2001 at 1545w1d here for cesarean section secondary to the indications listed under preoperative diagnoses; please see preoperative note for further details.  The risks of cesarean section were discussed with the patient including but were not limited to: bleeding which may require transfusion or reoperation; infection which may require antibiotics; injury to bowel, bladder, ureters or other surrounding organs; injury to the fetus; need for additional procedures including hysterectomy in the event of a life-threatening hemorrhage; placental abnormalities wth subsequent pregnancies, incisional problems, thromboembolic phenomenon and other postoperative/anesthesia complications.   The patient concurred with the proposed plan, giving informed written consent for the procedure.    FINDINGS:  Viable female infant in cephalic presentation.  Apgars 5 and 8.  Clear amniotic fluid.  Intact placenta, three vessel cord.  Normal uterus, fallopian tubes and ovaries bilaterally.  ANESTHESIA: Spinal  INTRAVENOUS FLUIDS: 2300 ml   ESTIMATED BLOOD LOSS: 800 ml URINE OUTPUT:  150 ml SPECIMENS: Placenta sent to L&D COMPLICATIONS: None immediate  PROCEDURE IN DETAIL:  The patient preoperatively received intravenous antibiotics and had sequential compression devices applied to her lower extremities.  She was then taken to the operating room where spinal anesthesia was administered and was found to be adequate. She was then placed in a dorsal supine position with a leftward tilt,  and prepped and draped in a sterile manner.  A foley catheter was placed into her bladder and attached to constant gravity.  After an adequate timeout was performed, a Pfannenstiel skin incision was made with scalpel over her preexisting scar and carried through to the underlying layer of fascia. The fascia was incised in the midline, and this incision was extended bilaterally using the Mayo scissors.  Kocher clamps were applied to the superior aspect of the fascial incision and the underlying rectus muscles were dissected off bluntly.  A similar process was carried out on the inferior aspect of the fascial incision. The rectus muscles were separated in the midline bluntly and the peritoneum was entered bluntly. Significant adhesions of the LUS to the abdominal wall were dissected.  Attention was turned to the lower uterine segment where a low transverse hysterotomy was made with a scalpel and extended bilaterally bluntly.  The infant was successfully delivered using Kiwi device, the cord was clamped and cut , and the infant was handed over to the awaiting neonatology team. Uterine massage was then administered, and the placenta delivered intact with a three-vessel cord. Cord pH was 7.216.The uterus was then cleared of clots and debris.  The hysterotomy was closed with 0 Vicryl in a running locked fashion, and an imbricating layer was also placed with 0 Vicryl.   The pelvis was cleared of all clot and debris. Hemostasis was confirmed on all surfaces.  The peritoneum was closed with a 2-0 Vicryl running stitch. The fascia was then closed using 0 Vicryl in a running fashion.  The subcutaneous layer was irrigated, then reapproximated with 2-0 plain gut interrupted stitches , and 30 ml of 0.5% Marcaine was injected subcutaneously around the incision.  The skin was closed with a 4-0 Vicryl subcuticular stitch. The patient tolerated the procedure well.  Sponge, lap, instrument and needle counts were correct x 3.  She was  taken to the recovery room in stable condition.    Adam PhenixArnold, James G, MD Attending Obstetrician & Gynecologist Faculty Practice, [facility]

## 2016-08-28 NOTE — Anesthesia Procedure Notes (Signed)
Spinal  Patient location during procedure: OR Start time: 08/28/2016 3:37 PM End time: 08/28/2016 3:39 PM Staffing Anesthesiologist: Leilani AbleHATCHETT, Malahki Gasaway Performed: anesthesiologist  Preanesthetic Checklist Completed: patient identified, surgical consent, pre-op evaluation, timeout performed, IV checked, risks and benefits discussed and monitors and equipment checked Spinal Block Patient position: sitting Prep: site prepped and draped and DuraPrep Patient monitoring: heart rate, cardiac monitor, continuous pulse ox and blood pressure Approach: midline Location: L3-4 Needle Needle type: Pencan  Needle gauge: 24 G Needle length: 10 cm Needle insertion depth: 8 cm Assessment Sensory level: T4

## 2016-08-28 NOTE — Transfer of Care (Signed)
Immediate Anesthesia Transfer of Care Note  Patient: Sarah Schmitt  Procedure(s) Performed: Procedure(s): CESAREAN SECTION (N/A)  Patient Location: PACU  Anesthesia Type:Spinal  Level of Consciousness: awake, alert  and oriented  Airway & Oxygen Therapy: Patient Spontanous Breathing  Post-op Assessment: Report given to RN and Post -op Vital signs reviewed and stable  Post vital signs: Reviewed and stable  Last Vitals:  Vitals:   08/28/16 1349  BP: 131/72  Pulse: (!) 108  Resp: 18  Temp: 36.6 C    Last Pain:  Vitals:   08/28/16 1349  TempSrc: Oral  PainSc: 0-No pain      Patients Stated Pain Goal: 4 (08/28/16 1407)  Complications: No apparent anesthesia complications

## 2016-08-28 NOTE — Anesthesia Postprocedure Evaluation (Signed)
Anesthesia Post Note  Patient: Sarah Schmitt  Procedure(s) Performed: Procedure(s) (LRB): CESAREAN SECTION (N/A)     Patient location during evaluation: PACU Anesthesia Type: Spinal Level of consciousness: awake Pain management: pain level controlled Vital Signs Assessment: post-procedure vital signs reviewed and stable Respiratory status: spontaneous breathing Cardiovascular status: stable Postop Assessment: no headache, no backache, spinal receding, patient able to bend at knees and no signs of nausea or vomiting Anesthetic complications: no    Last Vitals:  Vitals:   08/28/16 1715 08/28/16 1730  BP: (!) 112/41 92/80  Pulse: 84 86  Resp: 20 17  Temp:      Last Pain:  Vitals:   08/28/16 1730  TempSrc:   PainSc: 0-No pain   Pain Goal: Patients Stated Pain Goal: 4 (08/28/16 1407)               Kamsiyochukwu Buist JR,JOHN Susann GivensFRANKLIN

## 2016-08-28 NOTE — H&P (Signed)
Obstetric Preoperative History and Physical  Sarah Schmitt is a 21 y.o. G2P1001 with IUP at 2837w1d presenting for scheduled repeat cesarean section.  No acute concerns. Was a TOLAC but changed her mind about a week ago.   Prenatal Course Source of Care: [place]  with onset of care at 19 weeks Normal low-risk pregnancy Pregnancy complications or risks: Patient Active Problem List   Diagnosis Date Noted  . Encounter for supervision of normal pregnancy 04/03/2016  . Late prenatal care 04/03/2016  . History of cesarean delivery affecting pregnancy 03/14/2016   She plans to bottle feed She desires condoms for postpartum contraception.   Prenatal labs and studies: ABO, Rh: O/Positive/-- (05/15 1603) Antibody: Negative (05/15 1603) Rubella: 1.14 (05/15 1603) RPR: Non Reactive (05/18 1150)  HBsAg: Negative (05/15 1603)  HIV:   Non-reactive ZOX:WRUEAVWUGBS:Negative (06/25 1515) 1 hr Glucola  normal Genetic screening too late Anatomy US normal  Prenatal Transfer Tool  Maternal Diabetes: No Genetic Screening: Declined Maternal Ultrasounds/Referrals: Normal Fetal Ultrasounds or other Referrals:  None Maternal Substance Abuse:  No Significant Maternal Medications:  None Significant Maternal Lab Results: None  Past Medical History:  Diagnosis Date  . Medical history non-contributory     Past Surgical History:  Procedure Laterality Date  . CESAREAN SECTION      OB History  Gravida Para Term Preterm AB Living  2 1 1    0 1  SAB TAB Ectopic Multiple Live Births  0 0     1    # Outcome Date GA Lbr Len/2nd Weight Sex Delivery Anes PTL Lv  2 Gravida           1 Term 07/27/10    M CS-LTranv  N LIV     Birth Comments: Breech      Social History   Social History  . Marital status: Single    Spouse name: N/A  . Number of children: N/A  . Years of education: N/A   Social History Main Topics  . Smoking status: Former Smoker    Types: Cigarettes  . Smokeless tobacco: Never Used  .  Alcohol use No  . Drug use: No  . Sexual activity: Yes    Partners: Male    Birth control/ protection: None   Other Topics Concern  . None   Social History Narrative  . None    Family History  Problem Relation Age of Onset  . Diabetes Father   . Hypertension Maternal Grandmother   . Diabetes Paternal Grandmother     Prescriptions Prior to Admission  Medication Sig Dispense Refill Last Dose  . Prenatal Vit-Fe Fumarate-FA (PREPLUS) 27-1 MG TABS Take 1 tablet by mouth daily. 30 tablet 13 Taking  . acetaminophen (TYLENOL) 500 MG tablet Take 1 tablet (500 mg total) by mouth every 6 (six) hours as needed for mild pain or moderate pain. (Patient not taking: Reported on 06/28/2016) 30 tablet 0 Not Taking at Unknown time  . cyclobenzaprine (FLEXERIL) 10 MG tablet Take 1 tablet (10 mg total) by mouth every 8 (eight) hours as needed for muscle spasms. (Patient not taking: Reported on 08/07/2016) 20 tablet 0 Not Taking at Unknown time  . Elastic Bandages & Supports (COMFORT FIT MATERNITY SUPP LG) MISC 1 Units by Does not apply route daily. 1 each 0   . triamcinolone ointment (KENALOG) 0.5 % Apply 1 application topically 2 (two) times daily. To feet. (Patient not taking: Reported on 08/24/2016) 60 g PRN Not Taking at Unknown time  No Known Allergies  Review of Systems: Negative except for what is mentioned in HPI.  Physical Exam: BP 131/72   Pulse (!) 108   Temp 97.9 F (36.6 C) (Oral)   Resp 18   Ht 5\' 4"  (1.626 m)   Wt 207 lb (93.9 kg)   LMP 11/21/2015   Breastfeeding? Unknown   BMI 35.53 kg/m  FHR by Doppler: 165 bpm CONSTITUTIONAL: Well-developed, well-nourished female in no acute distress.  HENT:  Normocephalic, atraumatic, External right and left ear normal. Oropharynx is clear and moist EYES: Conjunctivae and EOM are normal. Pupils are equal, round, and reactive to light. No scleral icterus.  NECK: Normal range of motion, supple, no masses SKIN: Skin is warm and dry. No rash  noted. Not diaphoretic. No erythema. No pallor. NEUROLGIC: Alert and oriented to person, place, and time. Normal reflexes, muscle tone coordination. No cranial nerve deficit noted. PSYCHIATRIC: Normal mood and affect. Normal behavior. Normal judgment and thought content. CARDIOVASCULAR: Normal heart rate noted, regular rhythm RESPIRATORY: Effort and breath sounds normal, no problems with respiration noted ABDOMEN: Soft, nontender, nondistended, gravid. Well-healed Pfannenstiel incision. PELVIC: Deferred MUSCULOSKELETAL: Normal range of motion. No edema and no tenderness. 2+ distal pulses.   Pertinent Labs/Studies:   Results for orders placed or performed during the hospital encounter of 08/28/16 (from the past 72 hour(s))  Type and screen [facility]     Status: None (Preliminary result)   Collection Time: 08/28/16  1:51 PM  Result Value Ref Range   ABO/RH(D) O POS    Antibody Screen PENDING    Sample Expiration 08/31/2016   CBC     Status: Abnormal   Collection Time: 08/28/16  1:51 PM  Result Value Ref Range   WBC 9.0 4.0 - 10.5 K/uL   RBC 3.64 (L) 3.87 - 5.11 MIL/uL   Hemoglobin 10.7 (L) 12.0 - 15.0 g/dL   HCT 16.132.4 (L) 09.636.0 - 04.546.0 %   MCV 89.0 78.0 - 100.0 fL   MCH 29.4 26.0 - 34.0 pg   MCHC 33.0 30.0 - 36.0 g/dL   RDW 40.914.7 81.111.5 - 91.415.5 %   Platelets 267 150 - 400 K/uL    Assessment and Plan :Sarah Schmitt is a 21 y.o. G2P1001 at 7149w1d being admitted for scheduled repeat cesarean section. The risks of cesarean section discussed with the patient included but were not limited to: bleeding which may require transfusion or reoperation; infection which may require antibiotics; injury to bowel, bladder, ureters or other surrounding organs; injury to the fetus; need for additional procedures including hysterectomy in the event of a life-threatening hemorrhage; placental abnormalities wth subsequent pregnancies, incisional problems, thromboembolic phenomenon and  other postoperative/anesthesia complications. The patient concurred with the proposed plan, giving informed written consent for the procedure. Patient has been NPO since last night she will remain NPO for procedure. Anesthesia and OR aware. Preoperative prophylactic antibiotics and SCDs ordered on call to the OR. To OR when ready.   Caryl AdaJazma Louvenia Golomb, DO OB Fellow Faculty Practice, [facility] 08/28/2016, 2:37 PM

## 2016-08-28 NOTE — Anesthesia Preprocedure Evaluation (Signed)
Anesthesia Evaluation  Patient identified by MRN, date of birth, ID band Patient awake    Reviewed: Allergy & Precautions, H&P , NPO status , Patient's Chart, lab work & pertinent test results  Airway Mallampati: I  TM Distance: >3 FB Neck ROM: full    Dental no notable dental hx. (+) Teeth Intact   Pulmonary former smoker,    Pulmonary exam normal breath sounds clear to auscultation       Cardiovascular negative cardio ROS Normal cardiovascular exam Rhythm:regular Rate:Normal     Neuro/Psych negative neurological ROS  negative psych ROS   GI/Hepatic negative GI ROS, Neg liver ROS,   Endo/Other  negative endocrine ROS  Renal/GU negative Renal ROS  negative genitourinary   Musculoskeletal negative musculoskeletal ROS (+)   Abdominal (+) + obese,   Peds  Hematology negative hematology ROS (+)   Anesthesia Other Findings   Reproductive/Obstetrics (+) Pregnancy                             Anesthesia Physical Anesthesia Plan  ASA: II  Anesthesia Plan: Spinal   Post-op Pain Management:    Induction:   PONV Risk Score and Plan:   Airway Management Planned:   Additional Equipment:   Intra-op Plan:   Post-operative Plan:   Informed Consent: I have reviewed the patients History and Physical, chart, labs and discussed the procedure including the risks, benefits and alternatives for the proposed anesthesia with the patient or authorized representative who has indicated his/her understanding and acceptance.     Plan Discussed with: CRNA and Surgeon  Anesthesia Plan Comments:         Anesthesia Quick Evaluation

## 2016-08-29 ENCOUNTER — Encounter (HOSPITAL_COMMUNITY): Payer: Self-pay | Admitting: Obstetrics & Gynecology

## 2016-08-29 LAB — CBC
HEMATOCRIT: 26.8 % — AB (ref 36.0–46.0)
HEMOGLOBIN: 9.1 g/dL — AB (ref 12.0–15.0)
MCH: 30.3 pg (ref 26.0–34.0)
MCHC: 34 g/dL (ref 30.0–36.0)
MCV: 89.3 fL (ref 78.0–100.0)
Platelets: 207 10*3/uL (ref 150–400)
RBC: 3 MIL/uL — AB (ref 3.87–5.11)
RDW: 14.7 % (ref 11.5–15.5)
WBC: 11.7 10*3/uL — ABNORMAL HIGH (ref 4.0–10.5)

## 2016-08-29 LAB — BIRTH TISSUE RECOVERY COLLECTION (PLACENTA DONATION)

## 2016-08-29 LAB — RPR: RPR Ser Ql: NONREACTIVE

## 2016-08-29 MED ORDER — OXYCODONE-ACETAMINOPHEN 5-325 MG PO TABS
1.0000 | ORAL_TABLET | Freq: Four times a day (QID) | ORAL | Status: DC | PRN
  Administered 2016-08-30 (×4): 2 via ORAL
  Administered 2016-08-31 (×2): 1 via ORAL
  Filled 2016-08-29: qty 1
  Filled 2016-08-29 (×2): qty 2
  Filled 2016-08-29: qty 1
  Filled 2016-08-29 (×2): qty 2

## 2016-08-29 NOTE — Progress Notes (Signed)
Subjective: Postpartum Day 1: Cesarean Delivery Patient reports incisional pain, tolerating PO and no problems voiding.    Objective: Vital signs in last 24 hours: Temp:  [97.6 F (36.4 C)-98.6 F (37 C)] 98.3 F (36.8 C) (07/31 1502) Pulse Rate:  [67-102] 85 (07/31 1502) Resp:  [16-18] 16 (07/31 1502) BP: (106-121)/(44-68) 121/55 (07/31 1502) SpO2:  [98 %-100 %] 98 % (07/31 0730)  Physical Exam:  General: alert, cooperative and no distress Lochia: appropriate Uterine Fundus: firm Incision: no significant drainage DVT Evaluation: No evidence of DVT seen on physical exam. Negative Homan's sign. No cords or calf tenderness. No significant calf/ankle edema.   Recent Labs  08/28/16 1351 08/29/16 0557  HGB 10.7* 9.1*  HCT 32.4* 26.8*    Assessment/Plan: Status post Cesarean section. Doing well postoperatively.  Continue current care.  Levie HeritageJacob J Briggett Tuccillo 08/29/2016, 6:22 PM

## 2016-08-29 NOTE — Addendum Note (Signed)
Addendum  created 08/29/16 [id number] by Elgie CongoMalinova, Lakeyn Dokken H, CRNA   Sign clinical note

## 2016-08-29 NOTE — Lactation Note (Signed)
This note was copied from a baby's chart. Lactation Consultation Note  Patient Name: Sarah Schmitt NOPWK'H Date: 08/29/2016 Reason for consult: Initial assessment  NICU baby 64 hours old. Met with mom in NICU at baby's bedside. Offered to send BF referral to [facility], but mom declined stating that she has already been in contact with WIC this morning about a pump. Mom states that she took a breastfeeding class at [facility] as well. Offered to assist mom with pumping and/or hand expression, but mom declined stating that her nurse has already helped her and she is fine. Enc mom to pump every 2-3 hours for a total of 8-12 times/24 hours followed by hand expression. Mom given [facility] brochure and NICU booklet with review. Enc mom to call for assistance as needed.   Maternal Data Has patient been taught Hand Expression?: Yes (Per patient.) Does the patient have breastfeeding experience prior to this delivery?: No  Feeding Feeding Type: Bottle Fed - Formula Nipple Type: Slow - flow Length of feed: 25 min  LATCH Score                   Interventions    Lactation Tools Discussed/Used WIC Program: Yes Pump Review: Setup, frequency, and cleaning;Milk Storage Initiated by:: bedside RN Date initiated:: 08/28/16   Consult Status Consult Status: Follow-up Date: 08/30/16 Follow-up type: In-patient    [name] 08/29/2016, 6:38 PM

## 2016-08-29 NOTE — Progress Notes (Signed)
Mom set up with a DEBP. Royston CowperIsley, Tavie Haseman E, RN

## 2016-08-29 NOTE — Anesthesia Postprocedure Evaluation (Signed)
Anesthesia Post Note  Patient: Sarah Schmitt  Procedure(s) Performed: Procedure(s) (LRB): CESAREAN SECTION (N/A)     Patient location during evaluation: Mother Baby Anesthesia Type: Spinal Level of consciousness: awake and alert and oriented Pain management: pain level controlled Vital Signs Assessment: post-procedure vital signs reviewed and stable Respiratory status: spontaneous breathing and nonlabored ventilation Cardiovascular status: stable Postop Assessment: no headache, no signs of nausea or vomiting, no backache, adequate PO intake, spinal receding and patient able to bend at knees Anesthetic complications: no    Last Vitals:  Vitals:   08/28/16 2300 08/29/16 0330  BP:  106/68  Pulse:  95  Resp:  16  Temp: 36.9 C 37 C    Last Pain:  Vitals:   08/29/16 0627  TempSrc:   PainSc: 7    Pain Goal: Patients Stated Pain Goal: 4 (08/28/16 1407)               Laban EmperorMalinova,Leandria Thier Hristova

## 2016-08-30 ENCOUNTER — Encounter (HOSPITAL_COMMUNITY): Payer: Self-pay | Admitting: *Deleted

## 2016-08-30 ENCOUNTER — Encounter: Payer: Medicaid Other | Admitting: Certified Nurse Midwife

## 2016-08-30 NOTE — Progress Notes (Signed)
Patient had returned to room approximately 30 min ago c/o discomfort"10", frustration. Assessment done, pain meds given and K-pad given for discomfort. Encouraged to get something to eat and rest  before returning to NICU. Patient just seen ambulating with FOB back to NICU, having not eaten, rested or used K=pad.

## 2016-08-30 NOTE — Lactation Note (Signed)
This note was copied from a baby's chart. Lactation Consultation Note  Patient Name: Sarah Schmitt RUEAV'WToday's Date: 08/30/2016  Pecola LeisureBaby has been transferred back to mother's room.  She states she has been formula feeding because she doesn't get anything pumping.  Explained that pumping every 3 hours will assist in milk coming to volume.  I asked if she wanted to put baby to breast.  Mom seems unsure and states I know how to do it.  Instructed to call for assist prn.   Maternal Data    Feeding Feeding Type: Bottle Fed - Formula Nipple Type: Slow - flow Length of feed: 15 min  LATCH Score                   Interventions    Lactation Tools Discussed/Used     Consult Status      Huston FoleyMOULDEN, Dariya Gainer S 08/30/2016, 3:18 PM

## 2016-08-30 NOTE — Progress Notes (Signed)
Subjective: Postpartum Day #2: Cesarean Delivery Patient reports incisional pain, tolerating PO and no problems voiding.  Baby doing well in NICU, per pt report. Pain meds given during visit- last dose prior was last PM. Bottlefeeding. Plans condoms for pp contraception.  Objective: Vital signs in last 24 hours: Temp:  [97.7 F (36.5 C)-98.8 F (37.1 C)] 98 F (36.7 C) (08/01 0627) Pulse Rate:  [74-102] 74 (08/01 0627) Resp:  [14-18] 14 (08/01 0627) BP: (108-121)/(44-69) 108/58 (08/01 16100627)  Physical Exam:  General: alert, cooperative and mild distress Lochia: appropriate Uterine Fundus: firm Incision: honeycomb intact; unchanged DVT Evaluation: No evidence of DVT seen on physical exam.   Recent Labs  08/28/16 1351 08/29/16 0557  HGB 10.7* 9.1*  HCT 32.4* 26.8*    Assessment/Plan: Status post Cesarean section. Doing well postoperatively.  Continue current care. Will keep until tomorrow due to NICU infant.  Cam HaiSHAW, KIMBERLY CNM 08/30/2016, 7:32 AM

## 2016-08-31 MED ORDER — OXYCODONE-ACETAMINOPHEN 5-325 MG PO TABS: 1.0000 | ORAL_TABLET | Freq: Four times a day (QID) | ORAL | 0 refills | Status: DC | PRN

## 2016-08-31 MED ORDER — IBUPROFEN 600 MG PO TABS: 600.0000 mg | ORAL_TABLET | Freq: Four times a day (QID) | ORAL | 0 refills | Status: DC

## 2016-08-31 MED ORDER — SENNOSIDES-DOCUSATE SODIUM 8.6-50 MG PO TABS: 1.0000 | ORAL_TABLET | Freq: Every evening | ORAL | 0 refills | Status: DC | PRN

## 2016-08-31 NOTE — Discharge Summary (Signed)
OB Discharge Summary     Patient Name: Sarah Schmitt DOB: 07/10/1995 MRN: [id number]  Date of admission: 08/28/2016 Delivering MD: Adam PhenixARNOLD, JAMES G   Date of discharge: 08/31/2016  Admitting diagnosis: RCS Intrauterine pregnancy: 7881w1d     Secondary diagnosis:  Active Problems:   History of cesarean delivery affecting pregnancy   Late prenatal care   Cesarean delivery delivered  Additional problems:  Patient Active Problem List   Diagnosis Date Noted  . Cesarean delivery delivered 08/28/2016  . Encounter for supervision of normal pregnancy 04/03/2016  . Late prenatal care 04/03/2016  . History of cesarean delivery affecting pregnancy 03/14/2016       Discharge diagnosis: Term Pregnancy Delivered                                                                                                Post partum procedures:none  Augmentation: none  Complications: None  Hospital course:  Sceduled C/S   21 y.o. yo G2P2001 at 6981w1d was admitted to the hospital 08/28/2016 for scheduled cesarean section with the following indication:Elective Repeat.  Membrane Rupture Time/Date: 4:05 PM ,08/28/2016   Patient delivered a Viable infant.08/28/2016  Details of operation can be found in separate operative note.  Pateint had an uncomplicated postpartum course.  She is ambulating, tolerating a regular diet, passing flatus, and urinating well. Infant initially in NICU for TTN, but on POD#3 is in room with pt. Patient is discharged home in stable condition on  08/31/16         Physical exam  Vitals:   08/29/16 1859 08/30/16 0627 08/30/16 1807 08/31/16 0606  BP: 121/69 (!) 108/58 122/61 (!) 105/51  Pulse: 86 74 97 76  Resp: 18 14 18 18   Temp: 98.8 F (37.1 C) 98 F (36.7 C)  97.7 F (36.5 C)  TempSrc: Oral Oral  Oral  SpO2:      Weight:      Height:       General: alert, cooperative and no distress Lochia: appropriate Uterine Fundus: firm Incision: Healing well with no significant  drainage, No significant erythema, Dressing is clean, dry, and intact DVT Evaluation: No evidence of DVT seen on physical exam. No significant calf/ankle edema. Labs: Lab Results  Component Value Date   WBC 11.7 (H) 08/29/2016   HGB 9.1 (L) 08/29/2016   HCT 26.8 (L) 08/29/2016   MCV 89.3 08/29/2016   PLT 207 08/29/2016   CMP Latest Ref Rng & Units 03/08/2016  Glucose 65 - 99 mg/dL 84  BUN 6 - 20 mg/dL 6  Creatinine 7.820.44 - 9.561.00 mg/dL 2.130.47  Sodium 086135 - 578145 mmol/L 133(L)  Potassium 3.5 - 5.1 mmol/L 3.5  Chloride 101 - 111 mmol/L 102  CO2 22 - 32 mmol/L 22  Calcium 8.9 - 10.3 mg/dL 9.0  Total Protein 6.5 - 8.1 g/dL -  Total Bilirubin 0.3 - 1.2 mg/dL -  Alkaline Phos 38 - 469126 U/L -  AST 15 - 41 U/L -  ALT 14 - 54 U/L -    Discharge instruction: per After Visit Summary and "Baby and Me Booklet".  After visit meds:  Allergies as of 08/31/2016   No Known Allergies     Medication List    STOP taking these medications   COMFORT FIT MATERNITY SUPP LG Misc   cyclobenzaprine 10 MG tablet Commonly known as:  FLEXERIL   triamcinolone ointment 0.5 % Commonly known as:  KENALOG     TAKE these medications   acetaminophen 500 MG tablet Commonly known as:  TYLENOL Take 1 tablet (500 mg total) by mouth every 6 (six) hours as needed for mild pain or moderate pain.   ibuprofen 600 MG tablet Commonly known as:  ADVIL,MOTRIN Take 1 tablet (600 mg total) by mouth every 6 (six) hours.   oxyCODONE-acetaminophen 5-325 MG tablet Commonly known as:  PERCOCET/ROXICET Take 1-2 tablets by mouth every 6 (six) hours as needed for severe pain (breakthrough pain).   PREPLUS 27-1 MG Tabs Take 1 tablet by mouth daily.   senna-docusate 8.6-50 MG tablet Commonly known as:  Senokot-S Take 1 tablet by mouth at bedtime as needed for mild constipation.       Diet: routine diet  Activity: Advance as tolerated. Pelvic rest for 6 weeks.   Outpatient follow up:4 weeks Follow up Appt: Future  Appointments Date Time Provider Department Center  09/27/2016 3:45 PM Orvilla Cornwallenney, Rachelle A, CNM [facility] None   Follow up Visit:No Follow-up on file.  Postpartum contraception: Condoms  Newborn Data: Live born female  Birth Weight: 6 lb 14.4 oz (3130 g) APGAR: 5, 8  Baby Feeding: Breast Disposition:home with mother   08/31/2016 [place] Shirley, DO  CNM attestation I have seen and examined this patient and agree with above documentation in the resident's note.   Sarah Schmitt is a 21 y.o. G2P2001 s/p rLTCS.   Pain is well controlled.  Plan for birth control is condoms.  Method of Feeding: bottle  PE:  BP (!) 105/51 (BP Location: Right Arm)   Pulse 76   Temp 97.7 F (36.5 C) (Oral)   Resp 18   Ht 5\' 4"  (1.626 m)   Wt 93.9 kg (207 lb)   LMP 11/21/2015   SpO2 98%   Breastfeeding? Unknown   BMI 35.53 kg/m  Fundus firm   Recent Labs  08/28/16 1351 08/29/16 0557  HGB 10.7* 9.1*  HCT 32.4* 26.8*     Plan: discharge today - postpartum care discussed - f/u clinic in 4 weeks for postpartum visit   Cam HaiSHAW, KIMBERLY, CNM 8:54 AM 08/31/2016

## 2016-08-31 NOTE — Discharge Instructions (Signed)

## 2016-08-31 NOTE — Lactation Note (Signed)
This note was copied from a baby's chart. Lactation Consultation Note  Patient Name: Sarah Schmitt ZOXWR'UToday's Date: 08/31/2016   Visited with Mom on day of discharge, baby 2167 hrs old.  Mom pumping regularly and obtaining over 60 ml breastmilk.  Mom feeding baby during visit, and LC offered to assist with latch if she would like.  Mom unsure as she likes knowing exactly what baby is taking.  Talked about coming back for OP lactation appointment for a feeding assessment. [facility], paperwork given. OP appointment made for 09/04/16 at 10 am.  Encouraged STS and feeding baby on cue.  Mom to wait until OP appointment for assist with latch.  Showed Mom how to pace bottle feed to simulate milk flow from breast. To call for concerns prn.   Judee ClaraSmith, Aurelia Gras E 08/31/2016, 11:10 AM

## 2016-08-31 NOTE — Progress Notes (Signed)
Med given alittle early because patient only had one dose earlier.  Instructed her that med is every six hours and she cannot have it again until 1300

## 2016-08-31 NOTE — Progress Notes (Signed)
CSW received consult due to score greater than 9, or positive for SI on [place] Depression Screen.   RN called that MOB scored a 12.  MOB was pleasant and receptive to CSW's visit.  FOB was present and MOB wished for him to stay, stating that he has been a great support while she has been emotional over the last few days.  She reports that delivery was "a sad moment for me" because she knew something was not quite right.  She was very talkative and spoke about how she talked herself through her emotions at the time and now.  She is thankful that her son is healthy and able to go home at the same time as her today.  CSW commends her for her ability to express her feelings and thanked her for sharing her story.  CSW provided supportive brief counseling as she continued to process her feelings.  She then stated that she has felt like "an emotional wreck" throughout this whole pregnancy, which she notes is very different than her first pregnancy.  MOB states she has experienced depression and anxiety in the past and has been in counseling previously.  CSW asked if she felt like restarting therapy would be positive at this time given her hx, high emotionality in pregnancy, and NICU experience.  MOB considered CSW's question and determined that she does not think she needs counseling because she has "great support at home."  She agreed to monitor her emotions and ask for help if needed. CSW provided education regarding Baby Blues vs PMADs and provided MOB with information about [facility] groups [facility] as well as [facility].  CSW encouraged MOB to evaluate her mental health throughout the postpartum period with the use of the New Mom Checklist developed by Postpartum Progress and notify a medical professional if symptoms arise.  MOB agreed and stated significant appreciation for CSW's visit and concern for her emotional wellbeing.

## 2016-09-27 ENCOUNTER — Ambulatory Visit: Payer: Medicaid Other | Admitting: Certified Nurse Midwife

## 2016-10-11 ENCOUNTER — Ambulatory Visit: Payer: Self-pay | Admitting: Certified Nurse Midwife

## 2017-05-14 ENCOUNTER — Encounter: Payer: Medicaid Other | Admitting: Certified Nurse Midwife

## 2017-05-22 ENCOUNTER — Other Ambulatory Visit (HOSPITAL_COMMUNITY)
Admission: RE | Admit: 2017-05-22 | Discharge: 2017-05-22 | Disposition: A | Discharge status: Home / Self Care | Payer: Medicaid Other | Source: Ambulatory Visit | Attending: Certified Nurse Midwife | Admitting: Certified Nurse Midwife

## 2017-05-22 ENCOUNTER — Ambulatory Visit (INDEPENDENT_AMBULATORY_CARE_PROVIDER_SITE_OTHER): Payer: Medicaid Other | Admitting: Certified Nurse Midwife

## 2017-05-22 ENCOUNTER — Encounter: Payer: Self-pay | Admitting: Certified Nurse Midwife

## 2017-05-22 ENCOUNTER — Other Ambulatory Visit: Payer: Self-pay

## 2017-05-22 VITALS — BP 129/70 | HR 83 | Temp 97.9°F | Wt 194.0 lb

## 2017-05-22 DIAGNOSIS — Z3482 Encounter for supervision of other normal pregnancy, second trimester: Secondary | ICD-10-CM | POA: Diagnosis not present

## 2017-05-22 DIAGNOSIS — Z3480 Encounter for supervision of other normal pregnancy, unspecified trimester: Secondary | ICD-10-CM | POA: Insufficient documentation

## 2017-05-22 DIAGNOSIS — O34219 Maternal care for unspecified type scar from previous cesarean delivery: Secondary | ICD-10-CM

## 2017-05-22 NOTE — Progress Notes (Signed)
Subjective:   Sarah Schmitt is a 22 y.o. G3P2002 at 5727w5d by LMP being seen today for her first obstetrical visit.  Her obstetrical history is significant for close spaced pregnancies, repeat C-section, 2 previous. Patient does intend to breast feed. Pregnancy history fully reviewed.  Patient reports no complaints.  HISTORY: OB History  Gravida Para Term Preterm AB Living  3 2 2  0 0 2  SAB TAB Ectopic Multiple Live Births  0 0 0 0 1    # Outcome Date GA Lbr Len/2nd Weight Sex Delivery Anes PTL Lv  3 Current           2 Term 08/28/16 5043w1d  6 lb 14.4 oz (3.13 kg) M CS-Vac Spinal  LIV     Birth Comments: arrhythmia     Name: Alonna BucklerSCARBORO,BOY Dan     Apgar1: 5  Apgar5: 8  1 Term 07/27/10    M CS-LTranv  N LIV     Birth Comments: Breech    Last pap smear was done unknown, probable first pap smear.   Past Medical History:  Diagnosis Date  . Medical history non-contributory    Past Surgical History:  Procedure Laterality Date  . c sections    . CESAREAN SECTION    . CESAREAN SECTION N/A 08/28/2016   Procedure: CESAREAN SECTION;  Surgeon: Adam PhenixArnold, James G, MD;  [facility];  Service: Obstetrics;  Laterality: N/A;   Family History  Problem Relation Age of Onset  . Diabetes Father   . Hypertension Maternal Grandmother   . Diabetes Paternal Grandmother   . Hypertension Paternal Grandmother    Social History   Tobacco Use  . Smoking status: Former Smoker    Types: Cigarettes  . Smokeless tobacco: Never Used  Substance Use Topics  . Alcohol use: No  . Drug use: No   No Known Allergies Current Outpatient Medications on File Prior to Visit  Medication Sig Dispense Refill  . acetaminophen (TYLENOL) 500 MG tablet Take 1 tablet (500 mg total) by mouth every 6 (six) hours as needed for mild pain or moderate pain. (Patient not taking: Reported on 06/28/2016) 30 tablet 0  . ibuprofen (ADVIL,MOTRIN) 600 MG tablet Take 1 tablet (600 mg total) by mouth  every 6 (six) hours. (Patient not taking: Reported on 05/22/2017) 30 tablet 0  . oxyCODONE-acetaminophen (PERCOCET/ROXICET) 5-325 MG tablet Take 1-2 tablets by mouth every 6 (six) hours as needed for severe pain (breakthrough pain). (Patient not taking: Reported on 05/22/2017) 10 tablet 0  . Prenatal Vit-Fe Fumarate-FA (PREPLUS) 27-1 MG TABS Take 1 tablet by mouth daily. (Patient not taking: Reported on 05/22/2017) 30 tablet 13  . senna-docusate (SENOKOT-S) 8.6-50 MG tablet Take 1 tablet by mouth at bedtime as needed for mild constipation. (Patient not taking: Reported on 05/22/2017) 30 tablet 0   No current facility-administered medications on file prior to visit.     Review of Systems Pertinent items noted in HPI and remainder of comprehensive ROS otherwise negative.  Exam   Vitals:   05/22/17 1329  BP: 129/70  Pulse: 83  Temp: 97.9 F (36.6 C)  Weight: 194 lb (88 kg)   Fetal Heart Rate (bpm): 147; doppler  Uterus:  Fundal Height: 26 cm  Pelvic Exam: Perineum: no hemorrhoids, normal perineum   Vulva: normal external genitalia, no lesions   Vagina:  normal mucosa, normal discharge   Cervix: no lesions and normal, pap smear done.    Adnexa: normal adnexa and  no mass, fullness, tenderness   Bony Pelvis: average  System: General: well-developed, well-nourished female in no acute distress   Breast:  normal appearance, no masses or tenderness   Skin: normal coloration and turgor, no rashes   Neurologic: oriented, normal, negative, normal mood   Extremities: normal strength, tone, and muscle mass, ROM of all joints is normal   HEENT PERRLA, extraocular movement intact and sclera clear, anicteric   Mouth/Teeth mucous membranes moist, pharynx normal without lesions and dental hygiene good   Neck supple and no masses   Cardiovascular: regular rate and rhythm   Respiratory:  no respiratory distress, normal breath sounds   Abdomen: soft, non-tender; bowel sounds normal; no masses,  no  organomegaly     Assessment:   Pregnancy: [id number] Patient Active Problem List   Diagnosis Date Noted  . Supervision of other normal pregnancy, antepartum 05/22/2017  . Late prenatal care 04/03/2016  . History of cesarean delivery affecting pregnancy 03/14/2016     Plan:  1. Supervision of other normal pregnancy, antepartum    - Enroll Patient in Babyscripts - Obstetric Panel, Including HIV - Hemoglobinopathy evaluation - Culture, OB Urine - Cervicovaginal ancillary only - Cytology - PAP - Cystic Fibrosis Mutation 97 - [place] MFM OB DETAIL +14 WK; Future  2. History of cesarean delivery affecting pregnancy     - [place] MFM OB DETAIL +14 WK; Future   Initial labs drawn. Continue prenatal vitamins. Genetic Screening discussed, NIPS: ordered. Ultrasound discussed; fetal anatomic survey: ordered. Problem list reviewed and updated. The nature of [facility] with multiple MDs and other Advanced Practice Providers was explained to patient; also emphasized that residents, students are part of our team. Routine obstetric precautions reviewed. Return in about 3 weeks (around 06/12/2017) for ROB, 2 hr OGTT.     Orvilla Cornwall, CNM [facility] [employer], [facility]

## 2017-05-22 NOTE — Progress Notes (Signed)
NOB late to care at 24.5 weeks.

## 2017-05-23 ENCOUNTER — Other Ambulatory Visit: Payer: Self-pay | Admitting: Certified Nurse Midwife

## 2017-05-23 ENCOUNTER — Other Ambulatory Visit: Payer: Medicaid Other

## 2017-05-23 DIAGNOSIS — N760 Acute vaginitis: Principal | ICD-10-CM

## 2017-05-23 DIAGNOSIS — B9689 Other specified bacterial agents as the cause of diseases classified elsewhere: Secondary | ICD-10-CM

## 2017-05-23 LAB — CERVICOVAGINAL ANCILLARY ONLY
BACTERIAL VAGINITIS: POSITIVE — AB
CHLAMYDIA, DNA PROBE: NEGATIVE
Candida vaginitis: NEGATIVE
NEISSERIA GONORRHEA: NEGATIVE
TRICH (WINDOWPATH): NEGATIVE

## 2017-05-23 MED ORDER — SECNIDAZOLE 2 G PO PACK: 1.0000 | PACK | Freq: Once | ORAL | 0 refills | Status: AC

## 2017-05-25 ENCOUNTER — Ambulatory Visit (HOSPITAL_COMMUNITY)
Admission: RE | Admit: 2017-05-25 | Discharge: 2017-05-25 | Disposition: A | Discharge status: Home / Self Care | Payer: Medicaid Other | Source: Ambulatory Visit | Attending: Certified Nurse Midwife | Admitting: Certified Nurse Midwife

## 2017-05-25 ENCOUNTER — Other Ambulatory Visit: Payer: Self-pay | Admitting: Certified Nurse Midwife

## 2017-05-25 DIAGNOSIS — Z363 Encounter for antenatal screening for malformations: Secondary | ICD-10-CM | POA: Diagnosis not present

## 2017-05-25 DIAGNOSIS — O99212 Obesity complicating pregnancy, second trimester: Secondary | ICD-10-CM | POA: Insufficient documentation

## 2017-05-25 DIAGNOSIS — Z3A20 20 weeks gestation of pregnancy: Secondary | ICD-10-CM | POA: Diagnosis not present

## 2017-05-25 DIAGNOSIS — O0932 Supervision of pregnancy with insufficient antenatal care, second trimester: Secondary | ICD-10-CM

## 2017-05-25 DIAGNOSIS — Z3480 Encounter for supervision of other normal pregnancy, unspecified trimester: Secondary | ICD-10-CM

## 2017-05-25 DIAGNOSIS — O09892 Supervision of other high risk pregnancies, second trimester: Secondary | ICD-10-CM

## 2017-05-25 DIAGNOSIS — O34219 Maternal care for unspecified type scar from previous cesarean delivery: Secondary | ICD-10-CM | POA: Diagnosis not present

## 2017-05-25 LAB — OBSTETRIC PANEL, INCLUDING HIV
Antibody Screen: NEGATIVE
Basophils Absolute: 0 x10E3/uL (ref 0.0–0.2)
Basos: 0 %
EOS (ABSOLUTE): 0.1 x10E3/uL (ref 0.0–0.4)
Eos: 1 %
HIV Screen 4th Generation wRfx: NONREACTIVE
Hematocrit: 32.4 % — ABNORMAL LOW (ref 34.0–46.6)
Hemoglobin: 10.7 g/dL — ABNORMAL LOW (ref 11.1–15.9)
Hepatitis B Surface Ag: NEGATIVE
Immature Grans (Abs): 0 x10E3/uL (ref 0.0–0.1)
Immature Granulocytes: 0 %
Lymphocytes Absolute: 2.8 x10E3/uL (ref 0.7–3.1)
Lymphs: 33 %
MCH: 29.6 pg (ref 26.6–33.0)
MCHC: 33 g/dL (ref 31.5–35.7)
MCV: 90 fL (ref 79–97)
Monocytes Absolute: 0.7 x10E3/uL (ref 0.1–0.9)
Monocytes: 8 %
Neutrophils Absolute: 4.9 x10E3/uL (ref 1.4–7.0)
Neutrophils: 58 %
Platelets: 244 x10E3/uL (ref 150–379)
RBC: 3.61 x10E6/uL — ABNORMAL LOW (ref 3.77–5.28)
RDW: 15 % (ref 12.3–15.4)
RPR Ser Ql: NONREACTIVE
Rh Factor: POSITIVE
Rubella Antibodies, IGG: 1.36 {index}
WBC: 8.4 x10E3/uL (ref 3.4–10.8)

## 2017-05-25 LAB — HEMOGLOBINOPATHY EVALUATION
HGB C: 0 %
HGB S: 0 %
HGB VARIANT: 0 %
Hemoglobin A2 Quantitation: 2.3 % (ref 1.8–3.2)
Hemoglobin F Quantitation: 0 % (ref 0.0–2.0)
Hgb A: 97.7 % (ref 96.4–98.8)

## 2017-05-25 LAB — CULTURE, OB URINE

## 2017-05-25 LAB — CYTOLOGY - PAP
ADEQUACY: ABSENT
DIAGNOSIS: NEGATIVE
HPV (WINDOPATH): NOT DETECTED

## 2017-05-25 LAB — URINE CULTURE, OB REFLEX

## 2017-05-28 ENCOUNTER — Other Ambulatory Visit: Payer: Self-pay | Admitting: Certified Nurse Midwife

## 2017-05-28 ENCOUNTER — Telehealth: Payer: Self-pay

## 2017-05-28 DIAGNOSIS — Z3480 Encounter for supervision of other normal pregnancy, unspecified trimester: Secondary | ICD-10-CM

## 2017-05-28 DIAGNOSIS — O99019 Anemia complicating pregnancy, unspecified trimester: Secondary | ICD-10-CM | POA: Insufficient documentation

## 2017-05-28 DIAGNOSIS — O99012 Anemia complicating pregnancy, second trimester: Secondary | ICD-10-CM

## 2017-05-28 MED ORDER — CITRANATAL BLOOM 90-1 MG PO TABS: 1.0000 | ORAL_TABLET | Freq: Every day | ORAL | 12 refills | Status: DC

## 2017-05-28 MED ORDER — OB COMPLETE PETITE 35-5-1-200 MG PO CAPS: 1.0000 | ORAL_CAPSULE | Freq: Every day | ORAL | 12 refills | Status: DC

## 2017-05-28 NOTE — Telephone Encounter (Signed)
Pre-auth for Rx BV medication Solosec was approved on 05/28/17. #[id number] TC to pharmacy to make aware TC to pt to make aware pt not ava. LM

## 2017-05-29 ENCOUNTER — Telehealth: Payer: Self-pay | Admitting: *Deleted

## 2017-05-29 ENCOUNTER — Other Ambulatory Visit: Payer: Self-pay | Admitting: Certified Nurse Midwife

## 2017-05-29 DIAGNOSIS — Z3480 Encounter for supervision of other normal pregnancy, unspecified trimester: Secondary | ICD-10-CM

## 2017-05-29 LAB — CYSTIC FIBROSIS MUTATION 97: GENE DIS ANAL CARRIER INTERP BLD/T-IMP: NOT DETECTED

## 2017-05-29 NOTE — Telephone Encounter (Signed)
It has been update in her chart.   Sarah Schmitt

## 2017-05-29 NOTE — Telephone Encounter (Signed)
See message.

## 2017-05-29 NOTE — Telephone Encounter (Signed)
Please review [place] results According to patient due date has changed after the [place] was viewed by MFM, it is important for the Due date to be changed because she is in a Maternity home and it needs to be updated accordingly,please advise.Marland KitchenMarland Kitchen

## 2017-06-12 ENCOUNTER — Encounter: Payer: Medicaid Other | Admitting: Certified Nurse Midwife

## 2017-06-12 ENCOUNTER — Other Ambulatory Visit: Payer: Medicaid Other

## 2017-06-13 ENCOUNTER — Telehealth: Payer: Self-pay | Admitting: *Deleted

## 2017-06-13 NOTE — Telephone Encounter (Signed)
Left vmail for patient to callback and reschedule missed glucose and OB visit on 05/14./19

## 2017-06-27 ENCOUNTER — Encounter: Payer: Self-pay | Admitting: Obstetrics

## 2017-06-27 ENCOUNTER — Ambulatory Visit (HOSPITAL_COMMUNITY): Payer: Medicaid Other | Attending: Certified Nurse Midwife
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
## 2017-08-01 ENCOUNTER — Encounter (HOSPITAL_COMMUNITY): Payer: Self-pay

## 2017-08-07 DIAGNOSIS — Z3A31 31 weeks gestation of pregnancy: Secondary | ICD-10-CM | POA: Diagnosis not present

## 2017-08-07 DIAGNOSIS — Z363 Encounter for antenatal screening for malformations: Secondary | ICD-10-CM | POA: Diagnosis not present

## 2017-09-06 ENCOUNTER — Encounter: Payer: Medicaid Other | Admitting: Obstetrics and Gynecology

## 2017-09-13 ENCOUNTER — Encounter: Payer: Medicaid Other | Admitting: Obstetrics and Gynecology

## 2017-09-14 ENCOUNTER — Encounter (HOSPITAL_COMMUNITY): Payer: Self-pay

## 2017-09-17 ENCOUNTER — Other Ambulatory Visit: Payer: Self-pay | Admitting: Obstetrics and Gynecology

## 2017-09-17 DIAGNOSIS — O34219 Maternal care for unspecified type scar from previous cesarean delivery: Secondary | ICD-10-CM

## 2017-09-18 ENCOUNTER — Other Ambulatory Visit (HOSPITAL_COMMUNITY)
Admission: RE | Admit: 2017-09-18 | Discharge: 2017-09-18 | Disposition: A | Discharge status: Home / Self Care | Payer: Medicaid Other | Source: Ambulatory Visit | Attending: Obstetrics and Gynecology | Admitting: Obstetrics and Gynecology

## 2017-09-18 ENCOUNTER — Encounter: Payer: Self-pay | Admitting: Obstetrics and Gynecology

## 2017-09-18 ENCOUNTER — Encounter (HOSPITAL_COMMUNITY)
Admission: RE | Admit: 2017-09-18 | Discharge: 2017-09-18 | Disposition: A | Discharge status: Home / Self Care | Payer: Medicaid Other | Source: Ambulatory Visit | Attending: Obstetrics and Gynecology | Admitting: Obstetrics and Gynecology

## 2017-09-18 ENCOUNTER — Ambulatory Visit (INDEPENDENT_AMBULATORY_CARE_PROVIDER_SITE_OTHER): Payer: Medicaid Other | Admitting: Obstetrics and Gynecology

## 2017-09-18 VITALS — BP 119/71 | HR 68 | Wt 204.0 lb

## 2017-09-18 DIAGNOSIS — O34219 Maternal care for unspecified type scar from previous cesarean delivery: Secondary | ICD-10-CM | POA: Diagnosis not present

## 2017-09-18 DIAGNOSIS — Z3480 Encounter for supervision of other normal pregnancy, unspecified trimester: Secondary | ICD-10-CM

## 2017-09-18 LAB — CBC
HEMATOCRIT: 34.3 % — AB (ref 36.0–46.0)
HEMOGLOBIN: 11.4 g/dL — AB (ref 12.0–15.0)
MCH: 29.8 pg (ref 26.0–34.0)
MCHC: 33.2 g/dL (ref 30.0–36.0)
MCV: 89.8 fL (ref 78.0–100.0)
Platelets: 221 10*3/uL (ref 150–400)
RBC: 3.82 MIL/uL — AB (ref 3.87–5.11)
RDW: 14.4 % (ref 11.5–15.5)
WBC: 6.7 10*3/uL (ref 4.0–10.5)

## 2017-09-18 LAB — TYPE AND SCREEN
ABO/RH(D): O POS
Antibody Screen: NEGATIVE

## 2017-09-18 NOTE — Addendum Note (Signed)
Addended by: Marya LandryFOSTER, Jaziel Bennett D on: 09/18/2017 03:42 PM   Modules accepted: Orders

## 2017-09-18 NOTE — Progress Notes (Signed)
Patient states that she went to preadmission testing today and was told to come tomorrow 09/19/17 at 7:30 for repeat c-section. Sarah StammerJennifer Sabreen Kitchen RN

## 2017-09-18 NOTE — Patient Instructions (Signed)
Sarah Schmitt  09/18/2017   Your procedure is scheduled on:  09/19/2017  Enter through [facility] at 0730 AM.  Pick up the phone at the desk and dial [phone]  Call this number if you have problems the morning of [phone]  Remember:   Do not eat food:(After Midnight) Desps de medianoche.  Do not drink clear liquids: (After Midnight) Desps de medianoche.  Take these medicines the morning of surgery with A SIP OF WATER: none   Do not wear jewelry, make-up or nail polish.  Do not wear lotions, powders, or perfumes. Do not wear deodorant.  Do not shave 48 hours prior to surgery.  Do not bring valuables to the hospital.  [facility] is not   responsible for any belongings or valuables brought to the hospital.  Contacts, dentures or bridgework may not be worn into surgery.  Leave suitcase in the car. After surgery it may be brought to your room.  For patients admitted to the hospital, checkout time is 11:00 AM the day of              discharge.    N/A   Please read over the following fact sheets that you were given:   Surgical Site Infection Prevention

## 2017-09-18 NOTE — Progress Notes (Signed)
Subjective:  Sarah Schmitt is a 22 y.o. G3P2002 at 2538w0d being seen today for ongoing prenatal care.  She is currently monitored for the following issues for this high-risk pregnancy and has History of cesarean delivery affecting pregnancy; Late prenatal care; Supervision of other normal pregnancy, antepartum; and Anemia in pregnancy, second trimester on their problem list.  Patient reports general discomforts of pregnancy.  Contractions: Irritability. Vag. Bleeding: None.  Movement: Present. Denies leaking of fluid.   The following portions of the patient's history were reviewed and updated as appropriate: allergies, current medications, past family history, past medical history, past social history, past surgical history and problem list. Problem list updated.  Objective:   Vitals:   09/18/17 1427  BP: 119/71  Pulse: 68  Weight: 204 lb (92.5 kg)    Fetal Status: Fetal Heart Rate (bpm): 145   Movement: Present     General:  Alert, oriented and cooperative. Patient is in no acute distress.  Skin: Skin is warm and dry. No rash noted.   Cardiovascular: Normal heart rate noted  Respiratory: Normal respiratory effort, no problems with respiration noted  Abdomen: Soft, gravid, appropriate for gestational age. Pain/Pressure: Present     Pelvic:  Cervical exam deferred        Extremities: Normal range of motion.  Edema: Mild pitting, slight indentation  Mental Status: Normal mood and affect. Normal behavior. Normal judgment and thought content.   Urinalysis:      Assessment and Plan:  Pregnancy: G3P2002 at 7538w0d  1. Supervision of other normal pregnancy, antepartum Stable [place] Received sporadic PNC there.  She has now moved back to [place]  2. History of cesarean delivery affecting pregnancy Pt reports had pre admission appt today and is scheduled for c section tomorrow.  Review of chart does not indicate this. C section scheduled for  10/02/17. Pt instructed to keep her appt tomorrow to discuss c section Pt informed that I do not see an indication for c section and that we usually perform repeat c sections at 39 weeks A F/U appt was made for 1 week  Term labor symptoms and general obstetric precautions including but not limited to vaginal bleeding, contractions, leaking of fluid and fetal movement were reviewed in detail with the patient. Please refer to After Visit Summary for other counseling recommendations.  Return in about 1 week (around 09/25/2017) for OB visit.   Hermina StaggersErvin, Michael L, MD

## 2017-09-19 LAB — CERVICOVAGINAL ANCILLARY ONLY
Chlamydia: NEGATIVE
NEISSERIA GONORRHEA: NEGATIVE

## 2017-09-19 LAB — RPR: RPR Ser Ql: NONREACTIVE

## 2017-09-20 LAB — STREP GP B NAA: Strep Gp B NAA: POSITIVE — AB

## 2017-09-25 ENCOUNTER — Telehealth: Payer: Self-pay | Admitting: *Deleted

## 2017-09-25 NOTE — Telephone Encounter (Signed)
Pt called instated she did not have a appointment scheduled for today but wanted to know if she needed to come because scheduled C-Section is next week informed her Dr put that in notes because he wanted her to be seen., patient stated she already had blood work drawn and did not wish to come this week.Marland Kitchen..Marland Kitchen

## 2017-09-28 ENCOUNTER — Encounter (HOSPITAL_COMMUNITY)
Admission: RE | Admit: 2017-09-28 | Discharge: 2017-09-28 | Disposition: A | Discharge status: Home / Self Care | Payer: Medicaid Other | Source: Ambulatory Visit | Attending: Obstetrics and Gynecology | Admitting: Obstetrics and Gynecology

## 2017-09-30 ENCOUNTER — Encounter (HOSPITAL_COMMUNITY): Payer: Self-pay

## 2017-10-02 ENCOUNTER — Inpatient Hospital Stay (HOSPITAL_COMMUNITY)
Admission: AD | Admit: 2017-10-02 | Discharge: 2017-10-04 | DRG: 788 | Disposition: A | Discharge status: Home / Self Care | Payer: Medicaid Other | Attending: Obstetrics and Gynecology | Admitting: Obstetrics and Gynecology

## 2017-10-02 ENCOUNTER — Encounter (HOSPITAL_COMMUNITY)
Admission: AD | Disposition: A | Discharge status: Home / Self Care | Payer: Self-pay | Source: Home / Self Care | Attending: Obstetrics and Gynecology

## 2017-10-02 ENCOUNTER — Encounter (HOSPITAL_COMMUNITY): Payer: Self-pay | Admitting: *Deleted

## 2017-10-02 ENCOUNTER — Inpatient Hospital Stay (HOSPITAL_COMMUNITY): Payer: Medicaid Other | Admitting: Certified Registered Nurse Anesthetist

## 2017-10-02 ENCOUNTER — Other Ambulatory Visit: Payer: Self-pay

## 2017-10-02 DIAGNOSIS — Z87891 Personal history of nicotine dependence: Secondary | ICD-10-CM | POA: Diagnosis not present

## 2017-10-02 DIAGNOSIS — Z3A39 39 weeks gestation of pregnancy: Secondary | ICD-10-CM | POA: Diagnosis not present

## 2017-10-02 DIAGNOSIS — O99824 Streptococcus B carrier state complicating childbirth: Secondary | ICD-10-CM | POA: Diagnosis not present

## 2017-10-02 DIAGNOSIS — Z3A00 Weeks of gestation of pregnancy not specified: Secondary | ICD-10-CM | POA: Diagnosis not present

## 2017-10-02 DIAGNOSIS — O34211 Maternal care for low transverse scar from previous cesarean delivery: Principal | ICD-10-CM | POA: Diagnosis present

## 2017-10-02 DIAGNOSIS — Z98891 History of uterine scar from previous surgery: Secondary | ICD-10-CM

## 2017-10-02 DIAGNOSIS — O34219 Maternal care for unspecified type scar from previous cesarean delivery: Secondary | ICD-10-CM | POA: Diagnosis not present

## 2017-10-02 LAB — CBC
HEMATOCRIT: 31.7 % — AB (ref 36.0–46.0)
HEMOGLOBIN: 10.8 g/dL — AB (ref 12.0–15.0)
MCH: 30.3 pg (ref 26.0–34.0)
MCHC: 34.1 g/dL (ref 30.0–36.0)
MCV: 89 fL (ref 78.0–100.0)
Platelets: 210 10*3/uL (ref 150–400)
RBC: 3.56 MIL/uL — AB (ref 3.87–5.11)
RDW: 14.3 % (ref 11.5–15.5)
WBC: 7.2 10*3/uL (ref 4.0–10.5)

## 2017-10-02 LAB — TYPE AND SCREEN
ABO/RH(D): O POS
ANTIBODY SCREEN: NEGATIVE

## 2017-10-02 SURGERY — Surgical Case
Anesthesia: Spinal | Site: Abdomen | Wound class: Clean Contaminated

## 2017-10-02 MED ORDER — BUPIVACAINE IN DEXTROSE 0.75-8.25 % IT SOLN
INTRATHECAL | Status: DC | PRN
  Administered 2017-10-02: 1.6 mL via INTRATHECAL

## 2017-10-02 MED ORDER — SOD CITRATE-CITRIC ACID 500-334 MG/5ML PO SOLN
ORAL | Status: AC
  Administered 2017-10-02: 30 mL
  Filled 2017-10-02: qty 15

## 2017-10-02 MED ORDER — NALBUPHINE HCL 10 MG/ML IJ SOLN: 5.0000 mg | Freq: Once | INTRAMUSCULAR | Status: DC | PRN

## 2017-10-02 MED ORDER — TETANUS-DIPHTH-ACELL PERTUSSIS 5-2.5-18.5 LF-MCG/0.5 IM SUSP: 0.5000 mL | Freq: Once | INTRAMUSCULAR | Status: DC

## 2017-10-02 MED ORDER — PHENYLEPHRINE 8 MG IN D5W 100 ML (0.08MG/ML) PREMIX OPTIME
INJECTION | INTRAVENOUS | Status: AC
  Filled 2017-10-02: qty 100

## 2017-10-02 MED ORDER — SENNOSIDES-DOCUSATE SODIUM 8.6-50 MG PO TABS
2.0000 | ORAL_TABLET | ORAL | Status: DC
  Administered 2017-10-02 – 2017-10-03 (×2): 2 via ORAL
  Filled 2017-10-02 (×2): qty 2

## 2017-10-02 MED ORDER — SOD CITRATE-CITRIC ACID 500-334 MG/5ML PO SOLN: 30.0000 mL | Freq: Once | ORAL | Status: DC

## 2017-10-02 MED ORDER — OXYCODONE-ACETAMINOPHEN 5-325 MG PO TABS
1.0000 | ORAL_TABLET | ORAL | Status: AC | PRN
  Administered 2017-10-02 – 2017-10-03 (×4): 1 via ORAL
  Filled 2017-10-02 (×4): qty 1

## 2017-10-02 MED ORDER — ENOXAPARIN SODIUM 60 MG/0.6ML ~~LOC~~ SOLN
50.0000 mg | SUBCUTANEOUS | Status: DC
  Administered 2017-10-02: 50 mg via SUBCUTANEOUS
  Filled 2017-10-02 (×2): qty 0.6

## 2017-10-02 MED ORDER — OXYCODONE HCL 5 MG PO TABS
5.0000 mg | ORAL_TABLET | ORAL | Status: DC | PRN
  Filled 2017-10-02: qty 1

## 2017-10-02 MED ORDER — LACTATED RINGERS IV SOLN
INTRAVENOUS | Status: DC
  Administered 2017-10-02 – 2017-10-03 (×2): via INTRAVENOUS

## 2017-10-02 MED ORDER — SCOPOLAMINE 1 MG/3DAYS TD PT72
MEDICATED_PATCH | TRANSDERMAL | Status: AC
  Administered 2017-10-02: 1.5 mg
  Filled 2017-10-02: qty 1

## 2017-10-02 MED ORDER — ONDANSETRON HCL 4 MG/2ML IJ SOLN: 4.0000 mg | Freq: Three times a day (TID) | INTRAMUSCULAR | Status: DC | PRN

## 2017-10-02 MED ORDER — FENTANYL CITRATE (PF) 100 MCG/2ML IJ SOLN
INTRAMUSCULAR | Status: DC | PRN
  Administered 2017-10-02: 15 ug via INTRATHECAL

## 2017-10-02 MED ORDER — MEPERIDINE HCL 25 MG/ML IJ SOLN: 6.2500 mg | INTRAMUSCULAR | Status: DC | PRN

## 2017-10-02 MED ORDER — OXYCODONE HCL 5 MG PO TABS
10.0000 mg | ORAL_TABLET | ORAL | Status: DC | PRN
  Administered 2017-10-03: 10 mg via ORAL
  Filled 2017-10-02 (×2): qty 2

## 2017-10-02 MED ORDER — NALOXONE HCL 0.4 MG/ML IJ SOLN: 0.4000 mg | INTRAMUSCULAR | Status: DC | PRN

## 2017-10-02 MED ORDER — IBUPROFEN 600 MG PO TABS
600.0000 mg | ORAL_TABLET | Freq: Four times a day (QID) | ORAL | Status: DC
  Administered 2017-10-02 – 2017-10-04 (×6): 600 mg via ORAL
  Filled 2017-10-02 (×7): qty 1

## 2017-10-02 MED ORDER — SIMETHICONE 80 MG PO CHEW
80.0000 mg | CHEWABLE_TABLET | ORAL | Status: DC
  Administered 2017-10-02 – 2017-10-03 (×2): 80 mg via ORAL
  Filled 2017-10-02 (×2): qty 1

## 2017-10-02 MED ORDER — SODIUM CHLORIDE 0.9 % IR SOLN
Status: DC | PRN
  Administered 2017-10-02: 1

## 2017-10-02 MED ORDER — ONDANSETRON HCL 4 MG/2ML IJ SOLN
INTRAMUSCULAR | Status: AC
  Filled 2017-10-02: qty 2

## 2017-10-02 MED ORDER — MORPHINE SULFATE (PF) 0.5 MG/ML IJ SOLN
INTRAMUSCULAR | Status: AC
  Filled 2017-10-02: qty 10

## 2017-10-02 MED ORDER — OXYTOCIN 10 UNIT/ML IJ SOLN
INTRAMUSCULAR | Status: AC
  Filled 2017-10-02: qty 4

## 2017-10-02 MED ORDER — MORPHINE SULFATE (PF) 0.5 MG/ML IJ SOLN
INTRAMUSCULAR | Status: DC | PRN
  Administered 2017-10-02: .15 mg via INTRATHECAL

## 2017-10-02 MED ORDER — KETOROLAC TROMETHAMINE 30 MG/ML IJ SOLN
30.0000 mg | Freq: Four times a day (QID) | INTRAMUSCULAR | Status: AC | PRN
  Administered 2017-10-02: 30 mg via INTRAMUSCULAR

## 2017-10-02 MED ORDER — DIBUCAINE 1 % RE OINT: 1.0000 "application " | TOPICAL_OINTMENT | RECTAL | Status: DC | PRN

## 2017-10-02 MED ORDER — MENTHOL 3 MG MT LOZG: 1.0000 | LOZENGE | OROMUCOSAL | Status: DC | PRN

## 2017-10-02 MED ORDER — FENTANYL CITRATE (PF) 100 MCG/2ML IJ SOLN
INTRAMUSCULAR | Status: AC
  Filled 2017-10-02: qty 2

## 2017-10-02 MED ORDER — DIPHENHYDRAMINE HCL 50 MG/ML IJ SOLN: 12.5000 mg | INTRAMUSCULAR | Status: DC | PRN

## 2017-10-02 MED ORDER — NALBUPHINE HCL 10 MG/ML IJ SOLN: 5.0000 mg | INTRAMUSCULAR | Status: DC | PRN

## 2017-10-02 MED ORDER — OXYTOCIN 40 UNITS IN LACTATED RINGERS INFUSION - SIMPLE MED: 2.5000 [IU]/h | INTRAVENOUS | Status: AC

## 2017-10-02 MED ORDER — SIMETHICONE 80 MG PO CHEW
80.0000 mg | CHEWABLE_TABLET | Freq: Three times a day (TID) | ORAL | Status: DC
  Administered 2017-10-02 – 2017-10-04 (×3): 80 mg via ORAL
  Filled 2017-10-02 (×4): qty 1

## 2017-10-02 MED ORDER — WITCH HAZEL-GLYCERIN EX PADS: 1.0000 "application " | MEDICATED_PAD | CUTANEOUS | Status: DC | PRN

## 2017-10-02 MED ORDER — FENTANYL CITRATE (PF) 100 MCG/2ML IJ SOLN
25.0000 ug | Freq: Once | INTRAMUSCULAR | Status: AC
  Administered 2017-10-02: 25 ug via INTRAVENOUS
  Filled 2017-10-02: qty 2

## 2017-10-02 MED ORDER — ACETAMINOPHEN 325 MG PO TABS
650.0000 mg | ORAL_TABLET | ORAL | Status: DC | PRN
  Administered 2017-10-03: 650 mg via ORAL
  Filled 2017-10-02 (×2): qty 2

## 2017-10-02 MED ORDER — ACETAMINOPHEN 500 MG PO TABS
1000.0000 mg | ORAL_TABLET | Freq: Four times a day (QID) | ORAL | Status: AC
  Administered 2017-10-02 (×2): 1000 mg via ORAL
  Filled 2017-10-02 (×4): qty 2

## 2017-10-02 MED ORDER — PRENATAL MULTIVITAMIN CH
1.0000 | ORAL_TABLET | Freq: Every day | ORAL | Status: DC
  Administered 2017-10-03 – 2017-10-04 (×2): 1 via ORAL
  Filled 2017-10-02 (×2): qty 1

## 2017-10-02 MED ORDER — KETOROLAC TROMETHAMINE 30 MG/ML IJ SOLN
30.0000 mg | Freq: Four times a day (QID) | INTRAMUSCULAR | Status: AC | PRN
  Administered 2017-10-02: 30 mg via INTRAVENOUS
  Filled 2017-10-02: qty 1

## 2017-10-02 MED ORDER — DIPHENHYDRAMINE HCL 25 MG PO CAPS: 25.0000 mg | ORAL_CAPSULE | Freq: Four times a day (QID) | ORAL | Status: DC | PRN

## 2017-10-02 MED ORDER — SIMETHICONE 80 MG PO CHEW
80.0000 mg | CHEWABLE_TABLET | ORAL | Status: DC | PRN
  Administered 2017-10-03 – 2017-10-04 (×2): 80 mg via ORAL
  Filled 2017-10-02: qty 1

## 2017-10-02 MED ORDER — SCOPOLAMINE 1 MG/3DAYS TD PT72: 1.0000 | MEDICATED_PATCH | Freq: Once | TRANSDERMAL | Status: DC

## 2017-10-02 MED ORDER — LACTATED RINGERS IV SOLN
INTRAVENOUS | Status: DC
  Administered 2017-10-02 (×3): via INTRAVENOUS

## 2017-10-02 MED ORDER — LACTATED RINGERS IV SOLN: INTRAVENOUS | Status: DC

## 2017-10-02 MED ORDER — FENTANYL CITRATE (PF) 100 MCG/2ML IJ SOLN: 25.0000 ug | INTRAMUSCULAR | Status: DC | PRN

## 2017-10-02 MED ORDER — DEXAMETHASONE SODIUM PHOSPHATE 10 MG/ML IJ SOLN
INTRAMUSCULAR | Status: AC
  Filled 2017-10-02: qty 1

## 2017-10-02 MED ORDER — COCONUT OIL OIL: 1.0000 "application " | TOPICAL_OIL | Status: DC | PRN

## 2017-10-02 MED ORDER — PHENYLEPHRINE 8 MG IN D5W 100 ML (0.08MG/ML) PREMIX OPTIME
INJECTION | INTRAVENOUS | Status: DC | PRN
  Administered 2017-10-02: 60 ug/min via INTRAVENOUS

## 2017-10-02 MED ORDER — CEFAZOLIN SODIUM-DEXTROSE 2-4 GM/100ML-% IV SOLN
2.0000 g | INTRAVENOUS | Status: AC
  Administered 2017-10-02: 2 g via INTRAVENOUS

## 2017-10-02 MED ORDER — DIPHENHYDRAMINE HCL 25 MG PO CAPS
25.0000 mg | ORAL_CAPSULE | ORAL | Status: DC | PRN
  Filled 2017-10-02: qty 1

## 2017-10-02 MED ORDER — ONDANSETRON HCL 4 MG/2ML IJ SOLN
INTRAMUSCULAR | Status: DC | PRN
  Administered 2017-10-02: 4 mg via INTRAVENOUS

## 2017-10-02 MED ORDER — OXYTOCIN 10 UNIT/ML IJ SOLN
INTRAVENOUS | Status: DC | PRN
  Administered 2017-10-02: 40 [IU] via INTRAVENOUS

## 2017-10-02 MED ORDER — SODIUM CHLORIDE 0.9% FLUSH: 3.0000 mL | INTRAVENOUS | Status: DC | PRN

## 2017-10-02 MED ORDER — NALOXONE HCL 4 MG/10ML IJ SOLN
1.0000 ug/kg/h | INTRAVENOUS | Status: DC | PRN
  Filled 2017-10-02: qty 5

## 2017-10-02 MED ORDER — FENTANYL CITRATE (PF) 100 MCG/2ML IJ SOLN: 25.0000 ug | Freq: Once | INTRAMUSCULAR | Status: DC

## 2017-10-02 MED ORDER — KETOROLAC TROMETHAMINE 30 MG/ML IJ SOLN
INTRAMUSCULAR | Status: AC
  Administered 2017-10-02: 30 mg via INTRAVENOUS
  Filled 2017-10-02: qty 1

## 2017-10-02 MED ORDER — DEXAMETHASONE SODIUM PHOSPHATE 10 MG/ML IJ SOLN
INTRAMUSCULAR | Status: DC | PRN
  Administered 2017-10-02: 5 mg via INTRAVENOUS

## 2017-10-02 SURGICAL SUPPLY — 32 items
BARRIER ADHS 3X4 INTERCEED (GAUZE/BANDAGES/DRESSINGS) ×4 IMPLANT
BENZOIN TINCTURE PRP APPL 2/3 (GAUZE/BANDAGES/DRESSINGS) ×4 IMPLANT
CHLORAPREP W/TINT 26ML (MISCELLANEOUS) ×4 IMPLANT
CLAMP CORD UMBIL (MISCELLANEOUS) IMPLANT
CLOSURE STERI STRIP 1/2 X4 (GAUZE/BANDAGES/DRESSINGS) ×4 IMPLANT
DRSG OPSITE POSTOP 4X10 (GAUZE/BANDAGES/DRESSINGS) ×4 IMPLANT
ELECT REM PT RETURN 9FT ADLT (ELECTROSURGICAL) ×4
ELECTRODE REM PT RTRN 9FT ADLT (ELECTROSURGICAL) ×2 IMPLANT
EXTRACTOR VACUUM M CUP 4 TUBE (SUCTIONS) ×3 IMPLANT
EXTRACTOR VACUUM M CUP 4' TUBE (SUCTIONS) ×1
GAUZE SPONGE 4X4 12PLY STRL LF (GAUZE/BANDAGES/DRESSINGS) ×4 IMPLANT
GLOVE BIOGEL PI IND STRL 6.5 (GLOVE) ×2 IMPLANT
GLOVE BIOGEL PI IND STRL 7.0 (GLOVE) ×2 IMPLANT
GLOVE BIOGEL PI INDICATOR 6.5 (GLOVE) ×2
GLOVE BIOGEL PI INDICATOR 7.0 (GLOVE) ×2
GLOVE SURG SS PI 6.0 STRL IVOR (GLOVE) ×4 IMPLANT
GOWN STRL REUS W/TWL LRG LVL3 (GOWN DISPOSABLE) ×8 IMPLANT
KIT ABG SYR 3ML LUER SLIP (SYRINGE) IMPLANT
NEEDLE HYPO 25X5/8 SAFETYGLIDE (NEEDLE) IMPLANT
NS IRRIG 1000ML POUR BTL (IV SOLUTION) ×4 IMPLANT
PACK C SECTION WH (CUSTOM PROCEDURE TRAY) ×4 IMPLANT
PAD ABD 7.5X8 STRL (GAUZE/BANDAGES/DRESSINGS) ×4 IMPLANT
PAD OB MATERNITY 4.3X12.25 (PERSONAL CARE ITEMS) ×4 IMPLANT
PENCIL SMOKE EVAC W/HOLSTER (ELECTROSURGICAL) ×4 IMPLANT
RTRCTR C-SECT PINK 25CM LRG (MISCELLANEOUS) ×4 IMPLANT
SEPRAFILM MEMBRANE 5X6 (MISCELLANEOUS) IMPLANT
SUT MON AB 4-0 PS1 27 (SUTURE) ×4 IMPLANT
SUT PLAIN 0 NONE (SUTURE) ×4 IMPLANT
SUT VIC AB 0 CT1 36 (SUTURE) ×16 IMPLANT
SUT VIC AB 4-0 KS 27 (SUTURE) ×4 IMPLANT
TOWEL OR 17X24 6PK STRL BLUE (TOWEL DISPOSABLE) ×4 IMPLANT
TRAY FOLEY W/BAG SLVR 14FR LF (SET/KITS/TRAYS/PACK) ×4 IMPLANT

## 2017-10-02 NOTE — Progress Notes (Signed)
Pt states she took her pressure dressing off. Do not like it on. Honeycomb dressing with drainage noted. Pt states she is eating regular food at this time that family brought in. Take out food by bedside. Pt refused clear liquid diet at this time.

## 2017-10-02 NOTE — Op Note (Signed)
Sarah Schmitt PROCEDURE DATE: 10/02/2017  PREOPERATIVE DIAGNOSIS: Intrauterine pregnancy at  [redacted]w[redacted]d weeks gestation; previous uterine incision kerr x2  POSTOPERATIVE DIAGNOSIS: The same  PROCEDURE:     Cesarean Section  SURGEON:  Dr. Catalina Antigua  ASSISTANT: Dr. Marcy Siren  INDICATIONS: Sarah Schmitt is a 22 y.o. [id number] at [redacted]w[redacted]d scheduled for cesarean section secondary to previous uterine incision kerr x2.  The risks of cesarean section discussed with the patient included but were not limited to: bleeding which may require transfusion or reoperation; infection which may require antibiotics; injury to bowel, bladder, ureters or other surrounding organs; injury to the fetus; need for additional procedures including hysterectomy in the event of a life-threatening hemorrhage; placental abnormalities wth subsequent pregnancies, incisional problems, thromboembolic phenomenon and other postoperative/anesthesia complications. The patient concurred with the proposed plan, giving informed written consent for the procedure.    FINDINGS:  Viable female infant in cephalic presentation delivered with the assistance of a vaccum.  Apgars 9 and 9.  Clear amniotic fluid.  Intact placenta, three vessel cord.  Normal uterus, fallopian tubes and ovaries bilaterally. Significant adhesion involving lower uterine segment and anterior abdominal wall  ANESTHESIA:    Spinal INTRAVENOUS FLUIDS:2700 ml ESTIMATED BLOOD LOSS: 762 mL ml URINE OUTPUT:  150 ml SPECIMENS: Placenta sent to L&D COMPLICATIONS: None immediate  PROCEDURE IN DETAIL:  The patient received intravenous antibiotics and had sequential compression devices applied to her lower extremities while in the preoperative area.  She was then taken to the operating room where anesthesia was induced and was found to be adequate. A foley catheter was placed into her bladder and attached to Sarah Schmitt gravity. She was then placed in a dorsal supine  position with a leftward tilt, and prepped and draped in a sterile manner. After an adequate timeout was performed, a Pfannenstiel skin incision was made with scalpel and carried through to the underlying layer of fascia. The fascia was incised in the midline and this incision was extended bilaterally using the Mayo scissors. Kocher clamps were applied to the superior aspect of the fascial incision and the underlying rectus muscles were dissected off bluntly. A similar process was carried out on the inferior aspect of the facial incision. The rectus muscles were separated in the midline bluntly and the peritoneum was entered bluntly. Lysis of adhesion was performed. The Alexis self-retaining retractor was introduced into the abdominal cavity. Attention was turned to the lower uterine segment where a transverse hysterotomy was made with a scalpel and extended bilaterally bluntly. The infant was successfully delivered with the assistance of a vacuum. Delayed cord clamping was performed for 1-minute. The cord was clamped and cut and infant was handed over to awaiting neonatology team. Uterine massage was then administered and the placenta delivered intact with three-vessel cord. The uterus was cleared of clot and debris.  The hysterotomy was closed with 0 Vicryl in a running locked fashion, and an imbricating layer was also placed with a 0 Vicryl. Overall, excellent hemostasis was noted. The pelvis copiously irrigated and cleared of all clot and debris. Hemostasis was confirmed on all surfaces. Interseed was applied over the lower uterine segment. The muscles were reapproximated using 0 vicryl interrupted stitches. The fascia was then closed using 0 Vicryl in a running fashion.  The skin was closed in a subcuticular fashion using 3.0 Vicryl. The patient tolerated the procedure well. Sponge, lap, instrument and needle counts were correct x 2. She was taken to the recovery room in stable condition.  Sarah Murty ConstantMD   10/02/2017 10:45 AM

## 2017-10-02 NOTE — Addendum Note (Signed)
Addendum  created 10/02/17 1923 by Mal Amabile, MD   Order list changed

## 2017-10-02 NOTE — H&P (Signed)
Sarah Schmitt is a 22 y.o. female G3P2002 at [facility] presenting for scheduled repeat cesarean section. Patient with some prenatal care at [facility] and [place]. Patient with prenatal care complicated by 2 previous cesarean section. She reports good fetal movement. She denies leakage of fluid, contractions or vaginal bleeding  OB History    Gravida  3   Para  2   Term  2   Preterm      AB  0   Living  2     SAB  0   TAB  0   Ectopic      Multiple      Live Births  1          Past Medical History:  Diagnosis Date  . Medical history non-contributory    Past Surgical History:  Procedure Laterality Date  . c sections    . CESAREAN SECTION    . CESAREAN SECTION N/A 08/28/2016   Procedure: CESAREAN SECTION;  Surgeon: Adam Phenix, MD;  Location: [facility] BIRTHING SUITES;  Service: Obstetrics;  Laterality: N/A;   Family History: family history includes Diabetes in her father and paternal grandmother; Hypertension in her maternal grandmother and paternal grandmother. Social History:  reports that she has quit smoking. Her smoking use included cigarettes. She has never used smokeless tobacco. She reports that she does not drink alcohol or use drugs.     Maternal Diabetes: No Genetic Screening: Normal Maternal Ultrasounds/Referrals: Normal Fetal Ultrasounds or other Referrals:  None Maternal Substance Abuse:  No Significant Maternal Medications:  None Significant Maternal Lab Results:  None Other Comments:  None  ROS  See pertinent in HPI History   Blood pressure (!) 103/56, pulse 61, temperature 97.7 F (36.5 C), temperature source Oral, resp. rate 20, height 5\' 4"  (1.626 m), weight 92.4 kg, last menstrual period 11/30/2016, unknown if currently breastfeeding. Exam Physical Exam  GENERAL: Well-developed, well-nourished female in no acute distress.  LUNGS: Clear to auscultation bilaterally.  HEART: Regular rate and rhythm. ABDOMEN: Soft, nontender,  nondistended. No organomegaly. PELVIC: Not indicated EXTREMITIES: No cyanosis, clubbing, or edema, 2+ distal pulses.  Prenatal labs: ABO, Rh: --/--/O POS (08/20 1352) Antibody: NEG (08/20 1352) Rubella: 1.36 (04/23 1610) RPR: Non Reactive (08/20 1352)  HBsAg: Negative (04/23 1610)  HIV: Non Reactive (04/23 1610)  GBS: Positive (08/20 1544)   Assessment/Plan: 22 yo G3P2002 at 39 weeks here for scheduled repeat c-section  - Risks, benefits and alternatives were explained including but not limited to risks of bleeding, infection and damage to adjacent organs. - Patient verbalized understanding and all questions were answered. Consent signed - Patients Mirena IUD for contraception  Shakeira Rhee 10/02/2017, 9:05 AM

## 2017-10-02 NOTE — Transfer of Care (Signed)
Immediate Anesthesia Transfer of Care Note  Patient: Sarah Schmitt  Procedure(s) Performed: REPEAT CESAREAN SECTION (N/A Abdomen)  Patient Location: PACU  Anesthesia Type:Spinal  Level of Consciousness: awake  Airway & Oxygen Therapy: Patient Spontanous Breathing  Post-op Assessment: Report given to RN and Post -op Vital signs reviewed and stable  Post vital signs: stable  Last Vitals:  Vitals Value Taken Time  BP 115/38 10/02/2017 11:17 AM  Temp 36.4 C 10/02/2017 11:18 AM  Pulse 58 10/02/2017 11:22 AM  Resp 15 10/02/2017 11:22 AM  SpO2 100 % 10/02/2017 11:22 AM  Vitals shown include unvalidated device data.  Last Pain:  Vitals:   10/02/17 1118  TempSrc: Oral  PainSc:       Patients Stated Pain Goal: 4 (10/02/17 0801)  Complications: No apparent anesthesia complications

## 2017-10-02 NOTE — Anesthesia Postprocedure Evaluation (Signed)
Anesthesia Post Note  Patient: Sarah Schmitt  Procedure(s) Performed: REPEAT CESAREAN SECTION (N/A Abdomen)     Patient location during evaluation: Mother Baby Anesthesia Type: Spinal Level of consciousness: awake Pain management: pain level controlled Vital Signs Assessment: post-procedure vital signs reviewed and stable Respiratory status: spontaneous breathing Cardiovascular status: stable Postop Assessment: no headache, no backache, spinal receding, patient able to bend at knees, no apparent nausea or vomiting and adequate PO intake Anesthetic complications: no    Last Vitals:  Vitals:   10/02/17 1201 10/02/17 1232  BP:  (!) 98/55  Pulse: (!) 57 62  Resp: 13 19  Temp:  36.6 C  SpO2: 98% 100%    Last Pain:  Vitals:   10/02/17 1232  TempSrc: Oral  PainSc: 0-No pain   Pain Goal: Patients Stated Pain Goal: 4 (10/02/17 0801)               Fanny Dance

## 2017-10-02 NOTE — Progress Notes (Addendum)
Pt c/o pain of 9. Tylenol 1000 mg given. Pt states this is not enough. Dr Acey Lav Anesthesiologist notified. New orders received at this time. Dr. Acey Lav made aware of pt removed pressure drsg.

## 2017-10-02 NOTE — Progress Notes (Signed)
Pt out of bed with out RN. Pt refused orthostatic BP.

## 2017-10-02 NOTE — Anesthesia Procedure Notes (Signed)
Spinal  Patient location during procedure: OR Staffing Anesthesiologist: Lucretia Kern, MD Performed: anesthesiologist  Preanesthetic Checklist Completed: patient identified, site marked, surgical consent, pre-op evaluation, timeout performed, IV checked, risks and benefits discussed and monitors and equipment checked Spinal Block Patient position: sitting Prep: DuraPrep Patient monitoring: heart rate, cardiac monitor, continuous pulse ox and blood pressure Approach: midline Location: L3-4 Injection technique: single-shot Needle Needle type: Pencan  Needle gauge: 24 G Needle length: 9 cm

## 2017-10-02 NOTE — Progress Notes (Signed)
Dr Constant at bedside. 

## 2017-10-02 NOTE — Anesthesia Preprocedure Evaluation (Signed)
Anesthesia Evaluation  Patient identified by MRN, date of birth, ID band Patient awake    Reviewed: Allergy & Precautions, NPO status , Patient's Chart, lab work & pertinent test results  Airway Mallampati: II  TM Distance: >3 FB Neck ROM: Full    Dental no notable dental hx.    Pulmonary neg pulmonary ROS, former smoker,    Pulmonary exam normal breath sounds clear to auscultation       Cardiovascular negative cardio ROS Normal cardiovascular exam Rhythm:Regular Rate:Normal     Neuro/Psych negative neurological ROS  negative psych ROS   GI/Hepatic negative GI ROS, Neg liver ROS,   Endo/Other  negative endocrine ROS  Renal/GU negative Renal ROS  negative genitourinary   Musculoskeletal negative musculoskeletal ROS (+)   Abdominal   Peds negative pediatric ROS (+)  Hematology negative hematology ROS (+)   Anesthesia Other Findings   Reproductive/Obstetrics (+) Pregnancy                             Anesthesia Physical Anesthesia Plan  ASA: II  Anesthesia Plan: Spinal   Post-op Pain Management:    Induction:   PONV Risk Score and Plan: 3 and Treatment may vary due to age or medical condition and Scopolamine patch - Pre-op  Airway Management Planned: Natural Airway  Additional Equipment:   Intra-op Plan:   Post-operative Plan:   Informed Consent: I have reviewed the patients History and Physical, chart, labs and discussed the procedure including the risks, benefits and alternatives for the proposed anesthesia with the patient or authorized representative who has indicated his/her understanding and acceptance.   Dental advisory given  Plan Discussed with:   Anesthesia Plan Comments:         Anesthesia Quick Evaluation

## 2017-10-03 ENCOUNTER — Encounter (HOSPITAL_COMMUNITY): Payer: Self-pay | Admitting: Obstetrics and Gynecology

## 2017-10-03 LAB — RPR: RPR: NONREACTIVE

## 2017-10-03 LAB — CBC
HCT: 28.1 % — ABNORMAL LOW (ref 36.0–46.0)
Hemoglobin: 9.4 g/dL — ABNORMAL LOW (ref 12.0–15.0)
MCH: 30 pg (ref 26.0–34.0)
MCHC: 33.5 g/dL (ref 30.0–36.0)
MCV: 89.8 fL (ref 78.0–100.0)
Platelets: 180 10*3/uL (ref 150–400)
RBC: 3.13 MIL/uL — ABNORMAL LOW (ref 3.87–5.11)
RDW: 14.5 % (ref 11.5–15.5)
WBC: 12.4 10*3/uL — AB (ref 4.0–10.5)

## 2017-10-03 LAB — CREATININE, SERUM
Creatinine, Ser: 0.61 mg/dL (ref 0.44–1.00)
GFR calc non Af Amer: >60 mL/min (ref >60)

## 2017-10-03 LAB — BIRTH TISSUE RECOVERY COLLECTION (PLACENTA DONATION)

## 2017-10-03 MED ORDER — OXYCODONE-ACETAMINOPHEN 5-325 MG PO TABS
1.0000 | ORAL_TABLET | ORAL | Status: DC | PRN
  Administered 2017-10-03 – 2017-10-04 (×4): 1 via ORAL
  Filled 2017-10-03 (×4): qty 1

## 2017-10-03 MED ORDER — ONDANSETRON HCL 4 MG PO TABS
4.0000 mg | ORAL_TABLET | Freq: Three times a day (TID) | ORAL | Status: DC | PRN
  Administered 2017-10-03: 4 mg via ORAL
  Filled 2017-10-03: qty 1

## 2017-10-03 NOTE — Progress Notes (Signed)
Patient told the plan of care. Night shift RN states that she does what she wants and refuses care at times. Patient told to call out for pain meds. Patient already walking in halls. Patient used her incentive spirometer. Patient voiding fine and passing gas. Honeycomb saturated , resident aware that we need an order to change if they want it changed. No new orders at this time. Patient told to keep up with feedings on infant.
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
## 2017-10-03 NOTE — Progress Notes (Signed)
Post-Op Day #1 Subjective: Pt is a 22 yo G3P3003 [redacted]w[redacted]d s/p RCS. No significant overnight events. Pt denies any nausea, vomiting, or abdominal pain. Tolerating PO intake. Urinating normally, has not had a BM. Ambulating well.  No other complaints. She is breastfeeding. Contraception plan is Mirena IUD.   Objective: Blood pressure 103/68, pulse (!) 54, temperature 98.3 F (36.8 C), resp. rate 18, height 5\' 4"  (1.626 m), weight 92.4 kg, last menstrual period 11/30/2016, SpO2 100 %, unknown if currently breastfeeding.  Physical Exam:  General: alert, cooperative and no distress Lochia: appropriate Uterine Fundus: firm Incision: healing well, slight clear drainage present DVT Evaluation: Negative Homan's sign. No cords or calf tenderness. No significant calf/ankle edema.  Recent Labs    10/02/17 0745 10/03/17 0716  HGB 10.8* 9.4*  HCT 31.7* 28.1*    Assessment/Plan: Discharge home. F/u OP for IUD insertion.    LOS: 1 day   [employer] 10/03/2017, 12:16 PM

## 2017-10-03 NOTE — Plan of Care (Signed)
Progressing appropriately. Encouraged to call for assistance as needed, and for LATCH assessment.  

## 2017-10-03 NOTE — Lactation Note (Signed)
This note was copied from a baby's chart. Lactation Consultation Note  Patient Name: Sarah Schmitt Date: 10/03/2017   Visited with P3 Mom of term baby at 70 hrs old.  Baby at 4% weight loss.  Mom had baby in cradle hold, and baby popping on and off.  Offered to assist with better positioning, cross cradle and football hold.  Mom switched her hands to cross cradle hold.  Mom wanting to latch baby herself, and switched back to cradle saying that baby knows how to latch.  Educated on importance of a deep latch to breast.  Mom letting baby latch onto nipple.    Reviewed breast massage and hand expression.   Reviewed importance of STS and feeding baby often on cue.  Lactation brochure left with Mom.  Mom informed of IP and OP lactation services available to her.    Encouraged Mom to call prn.       Judee Clara 10/03/2017, 3:39 PM

## 2017-10-04 MED ORDER — OXYCODONE-ACETAMINOPHEN 5-325 MG PO TABS: 1.0000 | ORAL_TABLET | Freq: Four times a day (QID) | ORAL | 0 refills | Status: DC | PRN

## 2017-10-04 MED ORDER — IBUPROFEN 600 MG PO TABS: 600.0000 mg | ORAL_TABLET | Freq: Four times a day (QID) | ORAL | 2 refills | Status: DC | PRN

## 2017-10-04 MED ORDER — DOCUSATE SODIUM 100 MG PO CAPS: 100.0000 mg | ORAL_CAPSULE | Freq: Two times a day (BID) | ORAL | 2 refills | Status: DC | PRN

## 2017-10-04 MED ORDER — FERROUS SULFATE 325 (65 FE) MG PO TABS: 325.0000 mg | ORAL_TABLET | Freq: Two times a day (BID) | ORAL | 1 refills | Status: DC

## 2017-10-04 NOTE — Addendum Note (Signed)
Addendum  created 10/04/17 1210 by Phillips Grout, MD   Attestation recorded in Intraprocedure, Intraprocedure Attestations filed, Optician, dispensing edited

## 2017-10-04 NOTE — Progress Notes (Addendum)
Pt has taken all of her ID bands and Baby band off of her wrists. RN explained to Pt that this is how we identify you as a patient and how we ID you and baby together. Bracelets are in/on top of bend on the counter. RN expressed the importance of really keeping the baby band on her wrist. Pt states if she leaves the room she will put the bands back on, but they are getting on her nerves having to wear them.

## 2017-10-04 NOTE — Discharge Instructions (Signed)

## 2017-10-04 NOTE — Progress Notes (Signed)
Teaching done but patient was not paying any attention to it at all because she was wanting to go and was trying to pack up etc..Marland Kitchen

## 2017-10-04 NOTE — Discharge Summary (Signed)
OB Discharge Summary     Patient Name: Sarah Schmitt DOB: 1995/06/16 MRN: [id number]  Date of admission: 10/02/2017 Delivering MD: Catalina Antigua   Date of discharge: 10/04/2017  Admitting diagnosis: RCS Intrauterine pregnancy: [redacted]w[redacted]d     Secondary diagnosis:  Active Problems:   S/P cesarean section  Additional problems:None     Discharge diagnosis: Term Pregnancy Delivered                                                                                                Post partum procedures:None  Complications: None  Hospital course:  Sceduled C/S   22 y.o. yo G3P3003 at [redacted]w[redacted]d was admitted to the hospital 10/02/2017 for scheduled cesarean section with the following indication:Elective Repeat.  Membrane Rupture Time/Date: 10:06 AM ,10/02/2017   Patient delivered a Viable infant.10/02/2017  Details of operation can be found in separate operative note.  Pateint had an uncomplicated postpartum course.  She is ambulating, tolerating a regular diet, passing flatus, and urinating well. Patient is discharged home in stable condition on  10/04/17         Physical exam  Vitals:   10/03/17 0730 10/03/17 1449 10/03/17 2305 10/04/17 0532  BP: 103/68 (!) 103/47 (!) 127/56 116/66  Pulse: (!) 54 63 74 66  Resp:  17 18 17   Temp: 98.3 F (36.8 C) 98 F (36.7 C) 98.1 F (36.7 C) 98.1 F (36.7 C)  TempSrc:  Oral Oral Oral  SpO2:  98%    Weight:      Height:       General: alert, cooperative and no distress Lochia: appropriate Uterine Fundus: firm Incision: Healing well with no significant drainage, No significant erythema, Dressing is clean, dry, and intact DVT Evaluation: No evidence of DVT seen on physical exam. Negative Homan's sign. No cords or calf tenderness. No significant calf/ankle edema. Labs: Lab Results  Component Value Date   WBC 12.4 (H) 10/03/2017   HGB 9.4 (L) 10/03/2017   HCT 28.1 (L) 10/03/2017   MCV 89.8 10/03/2017   PLT 180 10/03/2017   CMP Latest Ref Rng  & Units 10/03/2017  Glucose 65 - 99 mg/dL -  BUN 6 - 20 mg/dL -  Creatinine 7.82 - 9.56 mg/dL 2.13  Sodium 086 - 578 mmol/L -  Potassium 3.5 - 5.1 mmol/L -  Chloride 101 - 111 mmol/L -  CO2 22 - 32 mmol/L -  Calcium 8.9 - 10.3 mg/dL -  Total Protein 6.5 - 8.1 g/dL -  Total Bilirubin 0.3 - 1.2 mg/dL -  Alkaline Phos 38 - 469 U/L -  AST 15 - 41 U/L -  ALT 14 - 54 U/L -    Discharge instruction: per After Visit Summary and "Baby and Me Booklet".  After visit meds:  Allergies as of 10/04/2017   No Known Allergies     Medication List    TAKE these medications   docusate sodium 100 MG capsule Commonly known as:  COLACE Take 1 capsule (100 mg total) by mouth 2 (two) times daily as needed for mild constipation or moderate constipation.  ferrous sulfate 325 (65 FE) MG tablet Take 1 tablet (325 mg total) by mouth 2 (two) times daily.   ibuprofen 600 MG tablet Commonly known as:  ADVIL,MOTRIN Take 1 tablet (600 mg total) by mouth every 6 (six) hours as needed for moderate pain or cramping.   OB COMPLETE PETITE 35-5-1-200 MG Caps Take 1 tablet by mouth daily.   oxyCODONE-acetaminophen 5-325 MG tablet Commonly known as:  PERCOCET/ROXICET Take 1-2 tablets by mouth every 6 (six) hours as needed for moderate pain or severe pain.            Discharge Care Instructions  (From admission, onward)         Start     Ordered   10/04/17 0000  Discharge wound care:    Comments:  As per discharge handout and nursing instructions   10/04/17 1055          Diet: routine diet  Activity: Advance as tolerated. Pelvic rest for 6 weeks.   Outpatient follow up:2 weeks Follow up Appt: Future Appointments  Date Time Provider Department Center  10/08/2017  3:30 PM Constant, Gigi Gin, MD [facility] None  10/29/2017  2:30 PM Constant, Gigi Gin, MD [facility] None   Follow up Visit:No follow-ups on file.  Postpartum contraception: IUD Mirena  Newborn Data: Live born female  Birth Weight: 6 lb 10  oz (3005 g) APGAR: 9, 9  Newborn Delivery   Birth date/time:  10/02/2017 10:08:00 Delivery type:  C-Section, Vacuum Assisted Trial of labor:  No C-section categorization:  Repeat     Baby Feeding: Breast Disposition:home with mother   10/04/2017 Jaynie Collins, MD

## 2017-10-08 ENCOUNTER — Encounter: Payer: Medicaid Other | Admitting: Obstetrics and Gynecology

## 2017-10-11 ENCOUNTER — Inpatient Hospital Stay (HOSPITAL_COMMUNITY)
Admission: AD | Admit: 2017-10-11 | Discharge: 2017-10-11 | Disposition: A | Discharge status: Home / Self Care | Payer: Medicaid Other | Source: Ambulatory Visit | Attending: Obstetrics and Gynecology | Admitting: Obstetrics and Gynecology

## 2017-10-11 ENCOUNTER — Encounter (HOSPITAL_COMMUNITY): Payer: Self-pay

## 2017-10-11 DIAGNOSIS — O8601 Infection of obstetric surgical wound, superficial incisional site: Secondary | ICD-10-CM | POA: Diagnosis not present

## 2017-10-11 DIAGNOSIS — Z8249 Family history of ischemic heart disease and other diseases of the circulatory system: Secondary | ICD-10-CM | POA: Insufficient documentation

## 2017-10-11 DIAGNOSIS — Z791 Long term (current) use of non-steroidal anti-inflammatories (NSAID): Secondary | ICD-10-CM | POA: Insufficient documentation

## 2017-10-11 DIAGNOSIS — Z833 Family history of diabetes mellitus: Secondary | ICD-10-CM | POA: Insufficient documentation

## 2017-10-11 DIAGNOSIS — G8918 Other acute postprocedural pain: Secondary | ICD-10-CM | POA: Diagnosis not present

## 2017-10-11 DIAGNOSIS — Z87891 Personal history of nicotine dependence: Secondary | ICD-10-CM | POA: Diagnosis not present

## 2017-10-11 DIAGNOSIS — Z79899 Other long term (current) drug therapy: Secondary | ICD-10-CM | POA: Diagnosis not present

## 2017-10-11 DIAGNOSIS — Z9889 Other specified postprocedural states: Secondary | ICD-10-CM | POA: Insufficient documentation

## 2017-10-11 DIAGNOSIS — O9089 Other complications of the puerperium, not elsewhere classified: Secondary | ICD-10-CM | POA: Diagnosis not present

## 2017-10-11 DIAGNOSIS — L089 Local infection of the skin and subcutaneous tissue, unspecified: Secondary | ICD-10-CM | POA: Diagnosis not present

## 2017-10-11 HISTORY — DX: Anemia, unspecified: D64.9

## 2017-10-11 HISTORY — DX: Hypotension, unspecified: I95.9

## 2017-10-11 MED ORDER — SULFAMETHOXAZOLE-TRIMETHOPRIM 800-160 MG PO TABS: 1.0000 | ORAL_TABLET | Freq: Two times a day (BID) | ORAL | 0 refills | Status: AC

## 2017-10-11 NOTE — MAU Provider Note (Signed)
History     CSN: [id number]  Arrival date and time: 10/11/17 1803  Seem bu [rpvoder at [id number]    Chief Complaint  Patient presents with  . Incisional Pain   HPI Sarah Schmitt 22 y.o.  Client is postpartum and is missed her incision check appointment in the office.  This is her 3rd C/S.  Noticed some open places in her incision and is having more pain on the left side.  States her pain medication is not helping and she is having more pain when she goes to bed at night.  Did not take any medications prior last evening before bed.  States she still has some narcotics and ibuprofen at home.  OB History    Gravida  3   Para  3   Term  3   Preterm      AB  0   Living  3     SAB  0   TAB  0   Ectopic      Multiple  0   Live Births  2           Past Medical History:  Diagnosis Date  . Anemia   . Hypotension     Past Surgical History:  Procedure Laterality Date  . c sections    . CESAREAN SECTION    . CESAREAN SECTION N/A 08/28/2016   Procedure: CESAREAN SECTION;  Surgeon: Adam Phenix, MD;  Location: [facility] BIRTHING SUITES;  Service: Obstetrics;  Laterality: N/A;  . CESAREAN SECTION N/A 10/02/2017   Procedure: REPEAT CESAREAN SECTION;  Surgeon: Catalina Antigua, MD;  Location: [facility] BIRTHING SUITES;  Service: Obstetrics;  Laterality: N/A;    Family History  Problem Relation Age of Onset  . Diabetes Father   . Hypertension Maternal Grandmother   . Diabetes Paternal Grandmother   . Hypertension Paternal Grandmother     Social History   Tobacco Use  . Smoking status: Former Smoker    Types: Cigarettes  . Smokeless tobacco: Never Used  Substance Use Topics  . Alcohol use: No  . Drug use: No    Allergies: No Known Allergies  Medications Prior to Admission  Medication Sig Dispense Refill Last Dose  . docusate sodium (COLACE) 100 MG capsule Take 1 capsule (100 mg total) by mouth 2 (two) times daily as needed for mild constipation or moderate  constipation. 30 capsule 2   . ferrous sulfate (FERROUSUL) 325 (65 FE) MG tablet Take 1 tablet (325 mg total) by mouth 2 (two) times daily. 60 tablet 1   . ibuprofen (ADVIL,MOTRIN) 600 MG tablet Take 1 tablet (600 mg total) by mouth every 6 (six) hours as needed for moderate pain or cramping. 30 tablet 2   . oxyCODONE-acetaminophen (PERCOCET/ROXICET) 5-325 MG tablet Take 1-2 tablets by mouth every 6 (six) hours as needed for moderate pain or severe pain. 30 tablet 0   . Prenat-FeCbn-FeAspGl-FA-Omega (OB COMPLETE PETITE) 35-5-1-200 MG CAPS Take 1 tablet by mouth daily. 30 capsule 12 Past Week at Unknown time    Review of Systems  Constitutional: Negative for fever.  Gastrointestinal: Positive for abdominal pain.  Genitourinary: Positive for vaginal bleeding. Negative for dysuria and vaginal discharge.   Physical Exam   Blood pressure 121/63, pulse 60, temperature 98.4 F (36.9 C), temperature source Oral, resp. rate 20, weight 87.5 kg, SpO2 98 %, unknown if currently breastfeeding.  Physical Exam  Nursing note and vitals reviewed. Constitutional: She is oriented to person, place, and time. She  appears well-developed and well-nourished.  HENT:  Head: Normocephalic.  Eyes: EOM are normal.  Neck: Neck supple.  Respiratory: Effort normal.  GI: Soft. There is no tenderness. There is no rebound and no guarding.  C/S incision visualized.  Entire inciision is intact.  There are 2 areas where the skin in open.  One on each side of the incision.  On the left side the skin is open approx. 6 mm - a small swab was used to probe and there was a very small amount of pale yellow drainage but there was no opening in the tissue, Underlying tissue was intact. There is 1 cm of slightly red tissue around the incision.  The area is slightly tender and more tender than the right side.  On the right side, there is no redness around the incision and there is a 2mm area adjacent to the insision that was slightly open  but not draining.  Of note, on the mons pubis, there was a 3mm area that was not open but filled with yellow fluid.   Client admits she gets carbuncles easliy when she shaves.  Musculoskeletal: Normal range of motion.  Neurological: She is alert and oriented to person, place, and time.  Skin: Skin is warm and dry.  Psychiatric: She has a normal mood and affect.    MAU Course  Procedures  MDM No fever here today.  Given the small open areas adjacent to her C/S incision and the area of redness and tenderness on the left side, she likely has a superficial skin infection so will treat for mild cellulitis and see her in the [facility] office on Monday or Tuesday for further follow up.  Assessment and Plan  Skin infection near the C/S incision  Plan Tke pain medication and ibuprofen before going to bed. Prescribed Bactrim DS BID for 7 days [facility] incision check on Monday or Tuesday.   Terri L Burleson 10/11/2017, 7:26 PM

## 2017-10-11 NOTE — Discharge Instructions (Signed)
Pick up the prescription at the pharmacy and begin today. Return if you have fever over 100.4 or lots of worsening drainage or body aches. Take your medication at night before bed - the pain pill and the ibuprofen together. Drink at least 8 8-oz glasses of water every day. Expect the office to call you to schedule you for the incision check on Monday or Tuesday.

## 2017-10-11 NOTE — MAU Note (Signed)
Pt is having pain in her incision, 9/10. States it feels hot and the pain meds aren't working. Pt missed her postop visit.

## 2017-10-15 ENCOUNTER — Ambulatory Visit: Payer: Medicaid Other | Admitting: Obstetrics and Gynecology

## 2017-10-29 ENCOUNTER — Ambulatory Visit: Payer: Medicaid Other | Admitting: Obstetrics and Gynecology

## 2017-12-25 ENCOUNTER — Encounter (HOSPITAL_COMMUNITY): Payer: Self-pay | Admitting: *Deleted

## 2017-12-25 ENCOUNTER — Inpatient Hospital Stay (HOSPITAL_COMMUNITY): Payer: Medicaid Other

## 2017-12-25 ENCOUNTER — Other Ambulatory Visit: Payer: Self-pay

## 2017-12-25 ENCOUNTER — Inpatient Hospital Stay (HOSPITAL_COMMUNITY)
Admission: AD | Admit: 2017-12-25 | Discharge: 2017-12-25 | Disposition: A | Discharge status: Home / Self Care | Payer: Medicaid Other | Source: Ambulatory Visit | Attending: Obstetrics and Gynecology | Admitting: Obstetrics and Gynecology

## 2017-12-25 DIAGNOSIS — Z87891 Personal history of nicotine dependence: Secondary | ICD-10-CM | POA: Diagnosis not present

## 2017-12-25 DIAGNOSIS — N938 Other specified abnormal uterine and vaginal bleeding: Secondary | ICD-10-CM | POA: Diagnosis present

## 2017-12-25 DIAGNOSIS — Z3A01 Less than 8 weeks gestation of pregnancy: Secondary | ICD-10-CM | POA: Diagnosis not present

## 2017-12-25 DIAGNOSIS — O34219 Maternal care for unspecified type scar from previous cesarean delivery: Secondary | ICD-10-CM | POA: Diagnosis not present

## 2017-12-25 DIAGNOSIS — O468X1 Other antepartum hemorrhage, first trimester: Secondary | ICD-10-CM | POA: Diagnosis not present

## 2017-12-25 DIAGNOSIS — O30041 Twin pregnancy, dichorionic/diamniotic, first trimester: Secondary | ICD-10-CM | POA: Diagnosis not present

## 2017-12-25 DIAGNOSIS — O418X11 Other specified disorders of amniotic fluid and membranes, first trimester, fetus 1: Secondary | ICD-10-CM | POA: Diagnosis not present

## 2017-12-25 DIAGNOSIS — O208 Other hemorrhage in early pregnancy: Secondary | ICD-10-CM

## 2017-12-25 DIAGNOSIS — O209 Hemorrhage in early pregnancy, unspecified: Secondary | ICD-10-CM

## 2017-12-25 DIAGNOSIS — Z833 Family history of diabetes mellitus: Secondary | ICD-10-CM | POA: Insufficient documentation

## 2017-12-25 LAB — URINALYSIS, ROUTINE W REFLEX MICROSCOPIC
Bilirubin Urine: NEGATIVE
GLUCOSE, UA: NEGATIVE mg/dL
Ketones, ur: NEGATIVE mg/dL
NITRITE: NEGATIVE
PROTEIN: 100 mg/dL — AB
Specific Gravity, Urine: 1.017 (ref 1.005–1.030)
WBC, UA: >50 WBC/hpf — ABNORMAL HIGH (ref 0–5)
pH: 7 (ref 5.0–8.0)

## 2017-12-25 LAB — CBC
HCT: 36.5 % (ref 36.0–46.0)
Hemoglobin: 11.8 g/dL — ABNORMAL LOW (ref 12.0–15.0)
MCH: 29.7 pg (ref 26.0–34.0)
MCHC: 32.3 g/dL (ref 30.0–36.0)
MCV: 91.9 fL (ref 80.0–100.0)
NRBC: 0 % (ref 0.0–0.2)
PLATELETS: 278 10*3/uL (ref 150–400)
RBC: 3.97 MIL/uL (ref 3.87–5.11)
RDW: 14.6 % (ref 11.5–15.5)
WBC: 11.6 10*3/uL — ABNORMAL HIGH (ref 4.0–10.5)

## 2017-12-25 LAB — HCG, QUANTITATIVE, PREGNANCY: hCG, Beta Chain, Quant, S: 59281 m[IU]/mL — ABNORMAL HIGH (ref <5)

## 2017-12-25 LAB — POCT PREGNANCY, URINE: Preg Test, Ur: POSITIVE — AB

## 2017-12-25 NOTE — Discharge Instructions (Signed)
Multiple Pregnancy Having a multiple pregnancy means that a woman is carrying more than one baby at a time. She may be pregnant with twins, triplets, or more. The majority of multiple pregnancies are twins. Naturally conceiving triplets or more (higher-order multiples) is rare. Multiple pregnancies are riskier than single pregnancies. A woman with a multiple pregnancy is more likely to have certain problems during her pregnancy. Therefore, she will need to have more frequent appointments for prenatal care. How does a multiple pregnancy happen? A multiple pregnancy happens when:  The woman's body releases more than one egg at a time, and then each egg gets fertilized by a different sperm. ? This is the most common type of multiple pregnancy. ? Twins or other multiples produced this way are fraternal. They are no more alike than non-multiple siblings are.  One sperm fertilizes one egg, which then divides into more than one embryo. ? Twins or other multiples produced this way are identical. Identical multiples are always the same gender, and they look very much alike.  Who is most likely to have a multiple pregnancy? A multiple pregnancy is more likely to develop in women who:  Have had fertility treatment, especially if the treatment included fertility drugs.  Are older than 22 years of age.  Have already had four or more children.  Have a family history of multiple pregnancy.  How is a multiple pregnancy diagnosed? A multiple pregnancy may be diagnosed based on:  Symptoms such as: ? Rapid weight gain in the first 3 months of pregnancy (first trimester). ? More severe nausea and breast tenderness than what is typical of a single pregnancy. ? The uterus measuring larger than what is normal for the stage of the pregnancy.  Blood tests that detect a higher-than-normal level of human chorionic gonadotropin (hCG). This is a hormone that your body produces in early pregnancy.  Ultrasound  exam. This is used to confirm that you are carrying multiples.  What risks are associated with multiple pregnancy? A multiple pregnancy puts you at a higher risk for certain problems during or after your pregnancy, including:  Having your babies delivered before you have reached a full-term pregnancy (preterm birth). A full-term pregnancy lasts for at least 37 weeks. Babies born before 37 weeks may have a higher risk of a variety of health problems, such as breathing problems, feeding difficulties, cerebral palsy, and learning disabilities.  Diabetes.  Preeclampsia. This is a serious condition that causes high blood pressure along with other symptoms, such as swelling and headaches, during pregnancy.  Excessive blood loss after childbirth (postpartum hemorrhage).  Postpartum depression.  Low birth weight of the babies.  How will having a multiple pregnancy affect my care? Your health care provider will want to monitor you more closely during your pregnancy to make sure that your babies are growing normally and that you are healthy. Follow these instructions at home: Because your pregnancy is considered to be high risk, you will need to work closely with your health care team. You may also need to make some lifestyle changes. These may include the following: Eating and drinking  Increase your nutrition. ? Follow your health care provider's recommendations for weight gain. You may need to gain a little extra weight when you are pregnant with multiples. ? Eat healthy snacks often throughout the day. This can add calories and reduce nausea.  Drink enough fluid to keep your urine clear or pale yellow.  Take prenatal vitamins. Activity By 20-24 weeks, you may   need to limit your activities.  Avoid activities and work that take a lot of effort (are strenuous).  Ask your health care provider when you should stop having sexual intercourse.  Rest often.  General instructions  Do not use  any products that contain nicotine or tobacco, such as cigarettes and e-cigarettes. If you need help quitting, ask your health care provider.  Do not drink alcohol or use illegal drugs.  Take over-the-counter and prescription medicines only as told by your health care provider.  Arrange for extra help around the house.  Keep all follow-up visits and all prenatal visits as told by your health care provider. This is important. Contact a health care provider if:  You have dizziness.  You have persistent nausea, vomiting, or diarrhea.  You are having trouble gaining weight.  You have feelings of depression or other emotions that are interfering with your normal activities. Get help right away if:  You have a fever.  You have pain with urination.  You have fluid leaking from your vagina.  You have a bad-smelling vaginal discharge.  You notice increased swelling in your face, hands, legs, or ankles.  You have spotting or bleeding from your vagina.  You have pelvic cramps, pelvic pressure, or nagging pain in your abdomen or lower back.  You are having regular contractions.  You develop a severe headache, with or without visual changes.  You have shortness of breath or chest pain.  You notice less fetal movement, or no fetal movement. This information is not intended to replace advice given to you by your health care provider. Make sure you discuss any questions you have with your health care provider. Document Released: 10/26/2007 Document Revised: 09/17/2015 Document Reviewed: 09/17/2015 Elsevier Interactive Patient Education  2018 [employer].  Safe Medications in Pregnancy   Acne: Benzoyl Peroxide Salicylic Acid  Backache/Headache: Tylenol: 2 regular strength every 4 hours OR              2 Extra strength every 6 hours  Colds/Coughs/Allergies: Benadryl (alcohol free) 25 mg every 6 hours as needed Breath right strips Claritin Cepacol throat  lozenges Chloraseptic throat spray Cold-Eeze- up to three times per day Cough drops, alcohol free Flonase (by prescription only) Guaifenesin Mucinex Robitussin DM (plain only, alcohol free) Saline nasal spray/drops Sudafed (pseudoephedrine) & Actifed ** use only after [redacted] weeks gestation and if you do not have high blood pressure Tylenol Vicks Vaporub Zinc lozenges Zyrtec   Constipation: Colace Ducolax suppositories Fleet enema Glycerin suppositories Metamucil Milk of magnesia Miralax Senokot Smooth move tea  Diarrhea: Kaopectate Imodium A-D  *NO pepto Bismol  Hemorrhoids: Anusol Anusol HC Preparation H Tucks  Indigestion: Tums Maalox Mylanta Zantac  Pepcid  Insomnia: Benadryl (alcohol free) 25mg  every 6 hours as needed Tylenol PM Unisom, no Gelcaps  Leg Cramps: Tums MagGel  Nausea/Vomiting:  Bonine Dramamine Emetrol Ginger extract Sea bands Meclizine  Nausea medication to take during pregnancy:  Unisom (doxylamine succinate 25 mg tablets) Take one tablet daily at bedtime. If symptoms are not adequately controlled, the dose can be increased to a maximum recommended dose of two tablets daily (1/2 tablet in the morning, 1/2 tablet mid-afternoon and one at bedtime). Vitamin B6 100mg  tablets. Take one tablet twice a day (up to 200 mg per day).  Skin Rashes: Aveeno products Benadryl cream or 25mg  every 6 hours as needed Calamine Lotion 1% cortisone cream  Yeast infection: Gyne-lotrimin 7 Monistat 7   **If taking multiple medications, please check labels  to avoid duplicating the same active ingredients **take medication as directed on the label ** Do not exceed 4000 mg of tylenol in 24 hours **Do not take medications that contain aspirin or ibuprofen

## 2017-12-25 NOTE — MAU Note (Signed)
Pt presents with complaints of vaginal bleeding that started on Wednesday. Positive pregnancy test on November the 20th. Reports pain with urination. Pt is incarcerated

## 2017-12-25 NOTE — MAU Provider Note (Signed)
History     CSN: [id number]  Arrival date and time: 12/25/17 1406   First Provider Initiated Contact with Patient 12/25/17 1440     Chief Complaint  Patient presents with  . Vaginal Bleeding   HPI Sarah Schmitt is a 22 y.o. [phone] at [redacted]w[redacted]d who presents with bleeding. She states she has seen blood when she wipes since last Wednesday. Unsure if it is vaginal or from her urethra. She is currently being treated for a UTI. She denies any pain or abnormal discharge. LMP 10/12. She has not taken a pregnancy test because she is incarcerated.   OB History    Gravida  4   Para  3   Term  3   Preterm      AB  0   Living  3     SAB  0   TAB  0   Ectopic      Multiple  0   Live Births  3           Past Medical History:  Diagnosis Date  . Anemia   . Hypotension     Past Surgical History:  Procedure Laterality Date  . c sections    . CESAREAN SECTION    . CESAREAN SECTION N/A 08/28/2016   Procedure: CESAREAN SECTION;  Surgeon: Adam Phenix, MD;  Location: [facility];  Service: Obstetrics;  Laterality: N/A;  . CESAREAN SECTION N/A 10/02/2017   Procedure: REPEAT CESAREAN SECTION;  Surgeon: Catalina Antigua, MD;  Location: [facility] BIRTHING SUIT[facility];  Service: Obstetrics;  Laterality: N/A;    Family History  Problem Relation Age of Onset  . Diabetes Father   . Hypertension Maternal Grandmother   . Diabetes Paternal Grandmother   . Hypertension Paternal Grandmother     Social History   Tobacco Use  . Smoking status: Former Smoker    Types: Cigarettes  . Smokeless tobacco: Never Used  Substance Use Topics  . Alcohol use: No  . Drug use: No    Allergies: No Known Allergies  Medications Prior to Admission  Medication Sig Dispense Refill Last Dose  . docusate sodium (COLACE) 100 MG capsule Take 1 capsule (100 mg total) by mouth 2 (two) times daily as needed for mild constipation or moderate constipation. 30 capsule 2   . ferrous sulfate  (FERROUSUL) 325 (65 FE) MG tablet Take 1 tablet (325 mg total) by mouth 2 (two) times daily. 60 tablet 1   . ibuprofen (ADVIL,MOTRIN) 600 MG tablet Take 1 tablet (600 mg total) by mouth every 6 (six) hours as needed for moderate pain or cramping. 30 tablet 2   . oxyCODONE-acetaminophen (PERCOCET/ROXICET) 5-325 MG tablet Take 1-2 tablets by mouth every 6 (six) hours as needed for moderate pain or severe pain. 30 tablet 0   . Prenat-FeCbn-FeAspGl-FA-Omega (OB COMPLETE PETITE) 35-5-1-200 MG CAPS Take 1 tablet by mouth daily. 30 capsule 12 Past Week at Unknown time    Review of Systems  Constitutional: Negative.  Negative for fatigue and fever.  HENT: Negative.   Respiratory: Negative.  Negative for shortness of breath.   Cardiovascular: Negative.  Negative for chest pain.  Gastrointestinal: Negative.  Negative for abdominal pain, constipation, diarrhea, nausea and vomiting.  Genitourinary: Positive for vaginal bleeding. Negative for dysuria.  Neurological: Negative.  Negative for dizziness and headaches.   Physical Exam   Blood pressure 125/62, pulse 76, temperature (!) 97.2 F (36.2 C), resp. rate 18, height 5\' 4"  (1.626 m), weight 87.1 kg,  last menstrual period 11/10/2017, unknown if currently breastfeeding.  Physical Exam  Nursing note and vitals reviewed. Constitutional: She is oriented to person, place, and time. She appears well-developed and well-nourished. No distress.  HENT:  Head: Normocephalic.  Eyes: Pupils are equal, round, and reactive to light.  Cardiovascular: Normal rate, regular rhythm and normal heart sounds.  Respiratory: Effort normal and breath sounds normal. No respiratory distress.  GI: Soft. Bowel sounds are normal. She exhibits no distension. There is no tenderness.  Genitourinary:  Genitourinary Comments: No blood noted  Neurological: She is alert and oriented to person, place, and time.  Skin: Skin is warm and dry.  Psychiatric: She has a normal mood and  affect. Her behavior is normal. Judgment and thought content normal.    MAU Course  Procedures Results for orders placed or performed during the hospital encounter of 12/25/17 (from the past 24 hour(s))  Urinalysis, Routine w reflex microscopic     Status: Abnormal   Collection Time: 12/25/17  2:26 PM  Result Value Ref Range   Color, Urine AMBER (A) YELLOW   APPearance CLOUDY (A) CLEAR   Specific Gravity, Urine 1.017 1.005 - 1.030   pH 7.0 5.0 - 8.0   Glucose, UA NEGATIVE NEGATIVE mg/dL   Hgb urine dipstick LARGE (A) NEGATIVE   Bilirubin Urine NEGATIVE NEGATIVE   Ketones, ur NEGATIVE NEGATIVE mg/dL   Protein, ur 161100 (A) NEGATIVE mg/dL   Nitrite NEGATIVE NEGATIVE   Leukocytes, UA MODERATE (A) NEGATIVE   RBC / HPF 21-50 0 - 5 RBC/hpf   WBC, UA >50 (H) 0 - 5 WBC/hpf   Bacteria, UA RARE (A) NONE SEEN   Squamous Epithelial / LPF 21-50 0 - 5   WBC Clumps PRESENT    Mucus PRESENT   Pregnancy, urine POC     Status: Abnormal   Collection Time: 12/25/17  2:30 PM  Result Value Ref Range   Preg Test, Ur POSITIVE (A) NEGATIVE  CBC     Status: Abnormal   Collection Time: 12/25/17  2:52 PM  Result Value Ref Range   WBC 11.6 (H) 4.0 - 10.5 K/uL   RBC 3.97 3.87 - 5.11 MIL/uL   Hemoglobin 11.8 (L) 12.0 - 15.0 g/dL   HCT 09.636.5 04.536.0 - 40.946.0 %   MCV 91.9 80.0 - 100.0 fL   MCH 29.7 26.0 - 34.0 pg   MCHC 32.3 30.0 - 36.0 g/dL   RDW 81.114.6 91.411.5 - 78.215.5 %   Platelets 278 150 - 400 K/uL   nRBC 0.0 0.0 - 0.2 %  hCG, quantitative, pregnancy     Status: Abnormal   Collection Time: 12/25/17  2:52 PM  Result Value Ref Range   hCG, Beta Chain, Quant, S 59,281 (H) <5 mIU/mL   [place] Ob Comp Addl Gest Less 14 Wks  Result Date: 12/25/2017 CLINICAL DATA:  Pregnant, bleeding EXAM: TWIN OBSTETRIC <14WK US AND TRANSVAGINAL OB US COMPARISON:  None. FINDINGS: Number of IUPs:  2 Chorionicity/Amnionicity:  Dichorionic diamniotic TWIN 1 Yolk sac:  Visualized. Embryo:  Visualized. Cardiac Activity: Visualized. Heart  Rate: 115 bpm CRL:  35.7 mm   6 w 0 d                  US EDC: 08/20/2018 TWIN 2 Yolk sac:  Visualized. Embryo:  Visualized. Cardiac Activity: Visualized. Heart Rate: 112 bpm CRL:  34.9 mm   5 w 6 d  [place] EDC: 08/21/2018 Subchorionic hemorrhage:  Moderate subchronic hemorrhage. Maternal uterus/adnexae: Bilateral ovaries are within normal limits. No free fluid. IMPRESSION: Live dichorionic diamniotic twin gestations, measuring approximately 5 weeks 6 days by crown-rump length, as above. Electronically Signed   By: Charline Bills M.D.   On: 12/25/2017 16:02   US Ob Less Than 14 Weeks With Ob Transvaginal  Result Date: 12/25/2017 CLINICAL DATA:  Pregnant, bleeding EXAM: TWIN OBSTETRIC <14WK [place] AND TRANSVAGINAL OB US COMPARISON:  None. FINDINGS: Number of IUPs:  2 Chorionicity/Amnionicity:  Dichorionic diamniotic TWIN 1 Yolk sac:  Visualized. Embryo:  Visualized. Cardiac Activity: Visualized. Heart Rate: 115 bpm CRL:  35.7 mm   6 w 0 d                  [place] EDC: 08/20/2018 TWIN 2 Yolk sac:  Visualized. Embryo:  Visualized. Cardiac Activity: Visualized. Heart Rate: 112 bpm CRL:  34.9 mm   5 w 6 d                  [place] EDC: 08/21/2018 Subchorionic hemorrhage:  Moderate subchronic hemorrhage. Maternal uterus/adnexae: Bilateral ovaries are within normal limits. No free fluid. IMPRESSION: Live dichorionic diamniotic twin gestations, measuring approximately 5 weeks 6 days by crown-rump length, as above. Electronically Signed   By: Charline Bills M.D.   On: 12/25/2017 16:02    MDM UA, UPT CBC, HCG ABO/Rh- O Pos Wet prep and gc/chlamydia US OB Comp Less 14 weeks with Transvaginal  Assessment and Plan   1. Dichorionic diamniotic twin pregnancy in first trimester   2. Vaginal bleeding affecting early pregnancy   3. [redacted] weeks gestation of pregnancy   4. Subchorionic hemorrhage of placenta in first trimester, fetus 1 of multiple gestation    -Discharge home in stable condition -Vaginal bleeding  precautions discussed -Patient advised to follow-up with OB of choice to start prenatal care -Patient may return to MAU as needed or if her condition were to change or worsen   Rolm Bookbinder CNM 12/25/2017, 2:40 PM

## 2018-01-21 ENCOUNTER — Encounter: Payer: Medicaid Other | Admitting: Advanced Practice Midwife

## 2018-03-04 DIAGNOSIS — O0990 Supervision of high risk pregnancy, unspecified, unspecified trimester: Secondary | ICD-10-CM | POA: Insufficient documentation

## 2018-03-04 HISTORY — DX: Supervision of high risk pregnancy, unspecified, unspecified trimester: O09.90

## 2018-03-05 ENCOUNTER — Encounter: Payer: Medicaid Other | Admitting: Obstetrics

## 2018-03-20 ENCOUNTER — Encounter: Payer: Medicaid Other | Admitting: Obstetrics

## 2018-04-05 ENCOUNTER — Other Ambulatory Visit: Payer: Self-pay

## 2018-04-05 ENCOUNTER — Encounter: Payer: Self-pay | Admitting: Medical

## 2018-04-05 ENCOUNTER — Other Ambulatory Visit (HOSPITAL_COMMUNITY)
Admission: RE | Admit: 2018-04-05 | Discharge: 2018-04-05 | Disposition: A | Discharge status: Home / Self Care | Payer: Medicaid Other | Source: Ambulatory Visit | Attending: Medical | Admitting: Medical

## 2018-04-05 ENCOUNTER — Ambulatory Visit (INDEPENDENT_AMBULATORY_CARE_PROVIDER_SITE_OTHER): Payer: Medicaid Other | Admitting: Medical

## 2018-04-05 VITALS — BP 109/66 | HR 84 | Temp 98.9°F | Wt 196.0 lb

## 2018-04-05 DIAGNOSIS — O30042 Twin pregnancy, dichorionic/diamniotic, second trimester: Secondary | ICD-10-CM

## 2018-04-05 DIAGNOSIS — O0930 Supervision of pregnancy with insufficient antenatal care, unspecified trimester: Secondary | ICD-10-CM

## 2018-04-05 DIAGNOSIS — B373 Candidiasis of vulva and vagina: Secondary | ICD-10-CM | POA: Diagnosis not present

## 2018-04-05 DIAGNOSIS — O0932 Supervision of pregnancy with insufficient antenatal care, second trimester: Secondary | ICD-10-CM | POA: Diagnosis not present

## 2018-04-05 DIAGNOSIS — O34219 Maternal care for unspecified type scar from previous cesarean delivery: Secondary | ICD-10-CM | POA: Diagnosis not present

## 2018-04-05 DIAGNOSIS — Z3A20 20 weeks gestation of pregnancy: Secondary | ICD-10-CM

## 2018-04-05 DIAGNOSIS — Z3490 Encounter for supervision of normal pregnancy, unspecified, unspecified trimester: Secondary | ICD-10-CM | POA: Diagnosis not present

## 2018-04-05 DIAGNOSIS — R8271 Bacteriuria: Secondary | ICD-10-CM | POA: Diagnosis not present

## 2018-04-05 DIAGNOSIS — O30049 Twin pregnancy, dichorionic/diamniotic, unspecified trimester: Secondary | ICD-10-CM

## 2018-04-05 DIAGNOSIS — B3731 Acute candidiasis of vulva and vagina: Secondary | ICD-10-CM

## 2018-04-05 DIAGNOSIS — B9689 Other specified bacterial agents as the cause of diseases classified elsewhere: Secondary | ICD-10-CM | POA: Diagnosis not present

## 2018-04-05 DIAGNOSIS — N760 Acute vaginitis: Secondary | ICD-10-CM

## 2018-04-05 DIAGNOSIS — O98812 Other maternal infectious and parasitic diseases complicating pregnancy, second trimester: Secondary | ICD-10-CM | POA: Diagnosis not present

## 2018-04-05 HISTORY — DX: Twin pregnancy, dichorionic/diamniotic, unspecified trimester: O30.049

## 2018-04-05 NOTE — Progress Notes (Signed)
   PRENATAL VISIT NOTE  Subjective:  Sarah Schmitt is a 23 y.o. [phone] at [redacted]w[redacted]d being seen today for first prenatal care visit.  She is currently monitored for the following issues for this high-risk pregnancy and has History of cesarean delivery affecting pregnancy; Late prenatal care; Anemia in pregnancy, second trimester; S/P cesarean section; Supervision of normal pregnancy, antepartum; and Twin gestation, dichorionic diamniotic on their problem list.  Patient reports no complaints.  Contractions: Irritability. Vag. Bleeding: None.   . Denies leaking of fluid.   The following portions of the patient's history were reviewed and updated as appropriate: allergies, current medications, past family history, past medical history, past social history, past surgical history and problem list. Problem list updated.  Objective:   Vitals:   04/05/18 0918  BP: 109/66  Pulse: 84  Temp: 98.9 F (37.2 C)  Weight: 196 lb (88.9 kg)    Fetal Status: Fetal Heart Rate (bpm): A:142/B:147         General:  Alert, oriented and cooperative. Patient is in no acute distress.  Skin: Skin is warm and dry. No rash noted.   Cardiovascular: Normal heart rate and rhythm noted  Respiratory: Normal respiratory effort, no problems with respiration noted. Clear to auscultation.   Abdomen: Soft, gravid, appropriate for gestational age. Normal bowel sounds. Non-tender.  Pain/Pressure: Present    Breast symmetric, no masses noted. No nipple discharge or bleeding.   Pelvic: Cervical exam performed      closed/thick  Extremities: Normal range of motion.  Edema: None  Mental Status: Normal mood and affect. Normal behavior. Normal judgment and thought content.   Assessment and Plan:  Pregnancy: [id number] at [redacted]w[redacted]d  1. Encounter for supervision of normal pregnancy, antepartum, unspecified gravidity - Obstetric Panel, Including HIV - Culture, OB Urine - Cervicovaginal ancillary only - Genetic Screening - SMN1  COPY NUMBER ANALYSIS ([facility] Carrier Screen) - Enroll Patient in Babyscripts - [place] MFM OB DETAIL +14 WK; Future - [place] MFM OB DETAIL ADDL GEST +14 WK; Future - AFP, Serum, Open Spina Bifida  2. Late prenatal care - First visit at 20 weeks  3. History of cesarean delivery affecting pregnancy - x3 plan for repeat at 38 weeks   4. Dichorionic diamniotic twin pregnancy in second trimester  Preterm labor symptoms and general obstetric precautions including but not limited to vaginal bleeding, contractions, leaking of fluid and fetal movement were reviewed in detail with the patient. Please refer to After Visit Summary for other counseling recommendations.  Return in about 4 weeks (around 05/03/2018) for [facility].  Future Appointments  Date Time Provider Department Center  04/11/2018  1:45 PM [facility] [place] 5 [facility]  05/03/2018 10:15 AM Marny Lowenstein, PA-C [facility] None    Vonzella Nipple, PA-C

## 2018-04-05 NOTE — Progress Notes (Signed)
New OB, twin pregnancy; late to care. She missed 3 previous appointments.  C/o cramping 5/10 x 1 week.

## 2018-04-05 NOTE — Patient Instructions (Addendum)
[facility] series from 1 to 3 pm at 2nd floor [place], [place]. Thank you! See you this afternoon!     AREA PEDIATRIC/FAMILY PRACTICE PHYSICIANS  Central/Southeast [place] [phone]) . [facility] Davy Pique, MD; Gwendlyn Deutscher, MD; Walker Kehr, MD; Andria Frames, MD; McDiarmid, MD; Dutch Quint, MD; Nori Riis, MD; Mingo Amber, [address] [place], [place] [address] o [phone] o Mon-Fri 8:30-12:30, 1:30-5:00 o Providers come to see babies at [facility] o Accepting Medicaid . [place] [facility] [place] providers who accept newborns: Dorthy Cooler, MD; Orland Mustard, MD; Stephanie Acre, MD o [place], [place], [place] [address] o [phone] o Mon-Fri 8:00-5:30 o Babies seen by providers at [facility] o Does NOT accept Medicaid o Please call early in hospitalization for appointment (limited availability)  . Mustard [place], MD o [address], [place], [place] [address] o [phone] o Mon, Tue, Thur, Fri 8:30-5:00, Wed 10:00-7:00 (closed 1-2pm) o Babies seen by [facility] providers o Accepting Medicaid . [place], MD o [place], [place], [place] [address] o [phone] o Mon-Fri 8:30-5:00, Sat 8:30-12:00 o Provider comes to see babies at [place] Medicaid o Must have been referred from current patients or contacted office prior to delivery . [place] [facility] ([place] [facility] [place]) Franne Forts, MD; Tamera Punt, MD; Doneen Poisson, MD; Fatima Sanger, MD; Wynetta Emery, MD; Jess Barters, MD; Tami Ribas, MD; Herbert Moors, MD; Derrell Lolling, MD; Dorothyann Peng, MD; Lucious Groves, NP; Baldo Ash, NP o [address], [place], [place] [address] o [phone] o Mon, Tue, Thur, Fri 8:30-5:30, Wed 9:30-5:30, Sat 8:30-12:30 o Babies seen by [facility] providers o Accepting Medicaid o Only accepting infants of  first-time parents or siblings of current patients [facility] discharge coordinator will make follow-up appointment . Baltazar Najjar o [place] [address], [place], [place]  [address] o [phone]   Fax - [phone] . [facility] o [address], [place], [place]  [address] o Phone - [phone]   Fax [phone] . [place], [place], [place], [place]  [address] o [phone]  East/Northeast [place] [phone]) . [facility] Reginal Lutes, MD; Jacklynn Ganong, MD; Torrie Mayers, MD; MD; Rosana Hoes, MD; Servando [place], MD; Rose Fillers, MD; Rex Kras, MD; Corinna Capra, MD; Volney American, MD; Trilby Drummer, MD; Janann Colonel, MD; Jimmye Norman, [place], [place], [place] [address] o [phone] o Mon-Fri 8:30-5:00 (extended evenings Mon-Thur as needed), Sat-Sun 10:00-1:00 o Providers come to see babies at [place] Medicaid for families of first-time babies and families with all children in the household age 92 and under. Must register with office prior to making appointment (M-F only). . [place], NP; Tomi Bamberger, MD; Redmond School, MD; [place], [place] [address] [address] [place], [place] [address] o [phone] o Mon-Fri 8:00-5:00 o Babies seen by providers at [facility] o Does NOT accept Medicaid/Commercial Insurance Only . [facility] [place] ([facility])  Marnee Guarneri, MD; Drema Dallas, MD; Montine Circle, MD; Vilma Prader, MD; Vanita Panda, MD; Alfonso Ramus, MD; Ruthann Cancer, MD; Roxanne Mins, MD; Rosalva Ferron, MD; Polly Cobia, MD o [address] [place], [place] [address] o [phone] o Mon-Fri 8:30-5:30, Sat (Oct.-Mar.) 9:00-1:00 o Babies seen by providers at [place] [phone]) . [facility] Peers, MD; Suzan Slick, [address], [place], [place] [address] o [phone] o Mon-Fri 8:30-5:00, Sat 8:30-12:00 o Providers come to see babies at [facility] o Does NOT accept Medicaid . [place] at [place], [place]; Twain Harte, MD; [place], [place]; Nancy Fetter, MD; Moreen Fowler, [place], [place], [place] [address] o [phone] o Mon-Fri 8:00-5:00 o Babies seen by providers at [facility] o Does NOT accept Medicaid o Only accepting babies of parents who are patients o Please call early in hospitalization for appointment (limited availability) . [facility] Blanca Friend, MD; Sharlene Motts, MD; Rod Can, MD; Warner Mccreedy, NP; Sabra Heck, MD; Ermalinda Memos, MD; Sharlett Iles, NP; Aurther Loft, MD; Jerrye Beavers, MD; Marcello Moores, MD; Berline Lopes, MD; Charolette Forward, MD o [place]. [place], [place], [place] [address] o [phone] o Mon-Fri 8:00-5:00, Sat 9:00-12:00 o Providers come to see babies at [facility] o Does NOT accept [facility] [phone]) . [place] at [place] providers accepting new patients: Dayna Ramus, NP; [place], [place], [place], [place] [address] o [phone] o Mon-Fri 8:00-5:00 o Babies seen by providers at [facility] o Does NOT accept Medicaid o Only accepting babies of parents who are patients o Please call early in hospitalization for appointment (limited availability) . [facility] Oswaldo Conroy, MD; Sheran Lawless, MD o [address] [place], [place] [address] o [phone] (press 1 to schedule appointment) o Mon-Fri 8:00-5:00 o Providers come to see babies at [facility] o Does NOT accept Medicaid . [facility] Jodi Mourning, MD o [address], [place], [place] [address] o [phone] o Mon-Fri 8:30-5:00 (lunch 12:30-1:00), extended hours by appointment only Wed 5:00-6:30 o Babies seen by [facility] providers o Accepting Medicaid . [place] at Evalyn Casco, MD; [place], MD; Ethlyn Gallery, MD o [place], [place], [place] [address] o [phone] o Mon-Fri 8:00-5:00 o Babies seen by [facility] providers o Does  NOT accept Medicaid . Therapist, music at [place], MD; Yong Channel, MD; [place], [address] [place], [place] [address] o [phone] o Mon-Fri 8:00-5:00 o Babies seen by [facility] providers o Does NOT accept Medicaid . [place], [place]; [place], [place]; [place], NP; Albertina Parr, MD; Frederic Jericho, MD; Ronney Lion, MD; Carlos Levering, NP; Jerelene Redden, NP; Tomasita Crumble, NP; Ronelle Nigh, NP; Corinna Lines, MD; [place], MD o [place] [place], [place] [address] o [phone] o Mon-Fri 8:30-5:00, Sat 10:00-1:00 o Providers come to see babies at [facility] o Does NOT accept Medicaid o Free prenatal information session Tuesdays at 4:45pm . [facility] Porfirio Oar, MD; [place], [place]; [place], [place]; [place], [place]., [place] [address] o [phone] o Mon-Fri 7:30-5:30 o Babies seen by [facility] providers . [facility] o [address] [place], [place], [place]  [address] o [phone]   Fax - [phone]  [place] [phone] & [phone]) . [place] o [address] [place], [place] [address] o [phone] o Mon-Thur 8:00-6:00 o Providers come to see babies at [place] Medicaid . [place], NP; Melford Aase, MD; [place], [place]; [place], [place]., [place], [place] [address] o [phone] o Mon-Thur 7:30-7:30, Fri 7:30-4:30 o Babies seen by [facility] providers o Accepting Medicaid . [facility] Nyra Jabs, MD; Cristino Martes, NP; Gertie Baron, MD o [place] [address], [place], [place] [address] o [phone] o Mon-Fri 8:30-5:00, Sat 8:30-12:00 o Providers come to see babies  at [place] Medicaid o Must have "Meet & Greet" appointment at office prior to delivery . [place] ([place]) Jodene Nam, MD; Juleen China, MD; Clydene Laming, [place] [address], [place], [place] [address] o [phone] o Mon-Wed 8:00-6:00, Thur-Fri 8:00-5:00, Sat 9:00-12:00 o Providers come to see babies at [facility] o Does NOT accept Medicaid o Only accepting siblings of current patients . [facility] [place], [place], [place], [place]  [address] o [phone]   Fax [phone] . [place] at [place] [address], [place], [place]  [address] o [phone]   Fax - [place] [phone] & [phone]) . Therapist, music at [place], DO; [place], [address]., [place], [place] [address] o [phone] o Mon-Fri 7:00-5:00 o Babies seen by [facility] providers o Does NOT accept Medicaid . [place], MD; [place], [place]; Ben Bolt, [place], [place], [place] [address] o [phone] o Mon-Fri 8:00-5:00 o Babies seen by [facility] providers o Accepting Medicaid . [place], MD; [place], [place]; Wallace, NP; [place], [place], [place], [place] [address] o [phone] o Mon-Fri 8:00-5:00 o Babies seen by providers at [place] [phone]) . [facility] [place], [place] [address] [place], [place] [address] o [phone] o Mon-Fri 8:00-5:00 o Babies seen by [facility] providers o Does NOT accept Medicaid o Limited availability, please call early in hospitalization to schedule follow-up . [facility] Leilani Merl, PA; Maisie Fus, MD; [place], MD; [place], [place]; Jeannine Kitten, MD; [place], [place] [facility] [address], [place], [place] [address] o [phone] o Mon-Fri 8:30-5:00, Sat 9:00-12:00 o Babies seen by providers at [facility] o Accepting Medicaid o Please register online  then schedule online or call office o [email or web address] . [place] ([place] at [place]) Kristian Covey, NP; Dwyane Dee, MD; Leonidas Romberg, PA o [address] [place], [place], [place] [address] o [phone] o Mon-Fri 8:00-5:00 o Babies seen by providers at [facility] o Accepting Medicaid . [place] ([facility] [employer]) Dairl Ponder, MD; Rayvon Char, NP; Melina Modena, MD o [address] [place], [place], [place] [address] o [phone] o Mon-Fri 8:00-5:30, Sat&Sun by appointment (phones open at 8:30) o Babies seen by [facility] providers o Accepting Medicaid o Must be a first-time baby or sibling of current patient . [place], [address], [place], [place]  [address] o [phone]   Fax - [phone]  [place] [phone] & [phone]) . [place], [place]; [place], [place]; Benjamine Mola, MD; [place], [place]; Harrell Lark, MD o [address] [place], [place] [address] o [phone] o Mon-Thur 8:00-7:00, Fri 8:00-5:00, Sat 8:00-12:00, Sun 9:00-12:00 o Babies seen by [facility] providers o Accepting Medicaid . [facility] at [facility], MD; Ruthann Cancer, MD; [facility], MD o [address], [place], [place] [address] o [phone] o Mon-Thur 8:00-5:00 o Babies seen by providers at [facility] o Accepting Medicaid . [facility] at [place], MD; Co[place], MD;  Amedeo Plenty, MD; Bobby Rumpf, MD; List, MD; Lavonia Drafts, MD; Ruthann Cancer, MD; Selinda Eon, MD; Audie Box, MD; Jim Like, MD; Christie Nottingham, MD; Hubbard Hartshorn, MD; Modena Nunnery, MD o [address] [place], [place] [address] o [phone] o Mon-Fri 8:00-5:30, Sat (Oct.-Mar.) 9:00-1:00 o Babies seen by providers at [facility] o Accepting Medicaid o Must fill out new patient packet, available online at  [email or web address] . [place] ([facility]) Barnabas Lister, NP; Kenton Kingfisher, NP; Claiborne Billings, NP; Rolla Plate, MD; [place], [place]; Carola Rhine, MD; Tyron Russell, MD; Delia Chimes, NP o [address], [place], [place] [address] o [phone] o Mon-Thur 8:00-5:30, Fri 8:00-5:00 o Babies seen by providers at [place] [phone]) . [place], [place]; [place], MD; Dennard Schaumann, MD; [place], [place] o [address] [place], [place] [address] o [phone] o Mon-Fri 8:00-5:00 o Babies seen by providers at [place] [phone]) . [place] at [place], [place]; Olen Pel, MD; [place], [place], [place], [place] [address] o [phone] o Mon-Fri 8:00-5:00 o Babies seen by providers at [facility] o Does NOT accept Medicaid o Limited appointment availability, please call early in hospitalization  . Therapist, music at [place], [place]; [place], [address] [place], [place] [address] o [phone] o Mon-Fri 8:00-5:00 o Babies seen by [facility] providers o Does NOT accept Medicaid . [facility] Su Grand, MD; Guy Sandifer, MD; [place], [place]; [place], [place] [address], [place], [place] [address] o [phone] o Mon-Fri 8:00-5:00 o After hours clinic [facility] [place], [place] [address]) [phone] Mon-Fri 5:00-8:00, Sat 12:00-6:00, Sun 10:00-4:00 o Babies seen by [facility] providers o Accepting Medicaid . [place] at [facility] o [address], [place], [place]  [address] o [phone]   Fax - [phone]  [place] [phone]) . Therapist, music at [facility], MD o [address] [place], [place], [place] [address] o [phone] o Mon-Fri 8:00-5:00 o Babies seen by [facility] providers o Does NOT accept  Medicaid . [place] ([place] [facility] [place]) Bing Neighbors, MD o [address] [place], [place], [place] [address] o [phone] o Mon-Thur 8:00-7:00, Fri 8:00-5:00, Sat 8:00-12:00 o Babies seen by providers at [facility] o Accepting Medicaid - but does not have vaccinations in office (must be received elsewhere) o Limited availability, please call early in hospitalization  [place] ([address]) . [place], MD o [address], [place] [address] [phone]  Fax [phone]  Childbirth Education Options: [facility] Department Classes:  Childbirth education classes can help you get ready for a positive parenting experience. You can also meet other expectant parents and get free stuff for your baby. Each class runs for five weeks on the same night and costs $45 for the mother-to-be and her support person. Medicaid covers the cost if you are eligible. Call [phone] to register. [facility] Childbirth Education:  [phone] or [phone] or [email or web address]  Baby & Me Class: Discuss newborn & infant parenting and family adjustment issues with other new mothers in a relaxed environment. Each week brings a new speaker or baby-centered activity. We encourage new mothers to join [place] every Thursday at 11:00am. Babies birth until crawling. No registration or fee. Daddy [employer]: This course  offers Dads-to-be the tools and knowledge needed to feel confident on their journey to becoming new fathers. Experienced dads, who have been trained as coaches, teach dads-to-be how to hold, comfort, diaper, swaddle and play with their infant while being able to support the new mom as well. A class for men taught by men. $25/dad Big Brother/Big Sister: Let your children share in the joy of a new brother or sister in this special class designed just for them. Class includes discussion about how  families care for babies: swaddling, holding, diapering, safety as well as how they can be helpful in their new role. This class is designed for children ages 41 to 42, but any age is welcome. Please register each child individually. $5/child  Mom Talk: This mom-led group offers support and connection to mothers as they journey through the adjustments and struggles of that sometimes overwhelming first year after the birth of a child. Tuesdays at 10:00am and Thursdays at 6:00pm. Babies welcome. No registration or fee. Breastfeeding Support Group: This group is a mother-to-mother support circle where moms have the opportunity to share their breastfeeding experiences. A Lactation Consultant is present for questions and concerns. Meets each Tuesday at 11:00am. No fee or registration. Breastfeeding Your Baby: Learn what to expect in the first days of breastfeeding your newborn.  This class will help you feel more confident with the skills needed to begin your breastfeeding experience. Many new mothers are concerned about breastfeeding after leaving the hospital. This class will also address the most common fears and challenges about breastfeeding during the first few weeks, months and beyond. (call for fee) Comfort Techniques and Tour: This 2 hour interactive class will provide you the opportunity to learn & practice hands-on techniques that can help relieve some of the discomfort of labor and encourage your baby to rotate toward the best position for birth. You and your partner will be able to try a variety of labor positions with birth balls and rebozos as well as practice breathing, relaxation, and visualization techniques. A tour of the [facility] is included with this class. $20 per registrant and support person Childbirth Class- Weekend Option: This class is a Weekend version of our Birth & Baby series. It is designed for parents who have a difficult time fitting several weeks of  classes into their schedule. It covers the care of your newborn and the basics of labor and childbirth. It also includes a [place] [facility] and lunch. The class is held two consecutive days: beginning on Friday evening from 6:30 - 8:30 p.m. and the next day, Saturday from 9 a.m. - 4 p.m. (call for fee) Doren Custard Class: Interested in a waterbirth?  This informational class will help you discover whether waterbirth is the right fit for you. Education about waterbirth itself, supplies you would need and how to assemble your support team is what you can expect from this class. Some obstetrical practices require this class in order to pursue a waterbirth. (Not all obstetrical practices offer waterbirth-check with your healthcare provider.) Register only the expectant mom, but you are encouraged to bring your partner to class! Required if planning waterbirth, no fee. Infant/Child CPR: Parents, grandparents, babysitters, and friends learn Cardio-Pulmonary Resuscitation skills for infants and children. You will also learn how to treat both conscious and unconscious choking in infants and children. This Family & Friends program does not offer certification. Register each participant individually to ensure that enough mannequins are available. (Call  for fee) Grandparent Love: Expecting a grandbaby? This class is for you! Learn about the latest infant care and safety recommendations and ways to support your own child as he or she transitions into the parenting role. Taught by Registered Nurses who are childbirth instructors, but most importantly...they are grandmothers too! $10/person. Childbirth Class- Natural Childbirth: This series of 5 weekly classes is for expectant parents who want to learn and practice natural methods of coping with the process of labor and childbirth. Relaxation, breathing, massage, visualization, role of the partner, and helpful positioning are highlighted.  Participants learn how to be confident in their body's ability to give birth. This class will empower and help parents make informed decisions about their own care. Includes discussion that will help new parents transition into the immediate postpartum period. [facility] is included. We suggest taking this class between 25-32 weeks, but it's only a recommendation. $75 per registrant and one support person or $30 Medicaid. Childbirth Class- 3 week Series: This option of 3 weekly classes helps you and your labor partner prepare for childbirth. Newborn care, labor & birth, cesarean birth, pain management, and comfort techniques are discussed and a [place] [facility] is included. The class meets at the same time, on the same day of the week for 3 consecutive weeks beginning with the starting date you choose. $60 for registrant and one support person.  Marvelous Multiples: Expecting twins, triplets, or more? This class covers the differences in labor, birth, parenting, and breastfeeding issues that face multiples' parents. NICU tour is included. Led by a Certified Childbirth Educator who is the mother of twins. No fee. Caring for Baby: This class is for expectant and adoptive parents who want to learn and practice the most up-to-date newborn care for their babies. Focus is on birth through the first six weeks of life. Topics include feeding, bathing, diapering, crying, umbilical cord care, circumcision care and safe sleep. Parents learn to recognize symptoms of illness and when to call the pediatrician. Register only the mom-to-be and your partner or support person can plan to come with you! $10 per registrant and support person Childbirth Class- online option: This online class offers you the freedom to complete a Birth and Baby series in the comfort of your own home. The flexibility of this option allows you to review sections at your own pace, at  times convenient to you and your support people. It includes additional video information, animations, quizzes, and extended activities. Get organized with helpful eClass tools, checklists, and trackers. Once you register online for the class, you will receive an email within a few days to accept the invitation and begin the class when the time is right for you. The content will be available to you for 60 days. $60 for 60 days of online access for you and your support people.  Local Doulas: Natural Baby Doulas [email or web address] Tel: [phone] [email or web address] [employer] [phone] [email or web address] The Labor Hassell Halim  (also do waterbirth tub rental) [phone] [email or web address] Birth [email or web address] [phone] [email or web address] [place] [email or web address] [employer] (PADA) [email or web address] [place] Birth and Baby  [email or web address] Considering Waterbirth? Guide for patients at Center for [employer]  Why consider waterbirth?  . Gentle birth for babies . Less pain medicine used in labor . May allow for passive descent/less pushing . May reduce perineal tears  . More mobility and instinctive maternal position changes . Increased maternal relaxation .  Reduced blood pressure in labor  Is waterbirth safe? What are the risks of infection, drowning or other complications?  . Infection: o Very low risk (3.7 % for tub vs 4.8% for bed) o 7 in 8000 waterbirths with documented infection o Poorly cleaned equipment most common cause o Slightly lower group B strep transmission rate  . Drowning o Maternal:  - Very low risk   - Related to seizures or fainting o Newborn:  - Very low risk. No evidence of increased risk of  respiratory problems in multiple large studies - Physiological protection from breathing under water - Avoid underwater birth if there are any fetal complications - Once baby's head is out of the water, keep it out.  . Birth complication o Some reports of cord trauma, but risk decreased by bringing baby to surface gradually o No evidence of increased risk of shoulder dystocia. Mothers can usually change positions faster in water than in a bed, possibly aiding the maneuvers to free the shoulder.   You must attend a Doren Custard class at [facility]  3rd Wednesday of every month from 7-9pm  [employer] by calling [phone] or online at [email or web address]  Bring [place] the certificate from the class to your prenatal appointment  Meet with a midwife at 36 weeks to see if you can still plan a waterbirth and to sign the consent.   Purchase or rent the following supplies:   Water Birth Pool (Birth Pool in a Box or [place] for instance)  (Tubs start ~$125)  Single-use disposable tub liner designed for your brand of tub  New garden hose labeled "lead-free", "suitable for drinking water",  Electric drain pump to remove water (We recommend 792 gallon per hour or greater pump.)   Separate garden hose to remove the dirty water  Fish net  Bathing suit top (optional)  Long-handled mirror (optional)  Places to purchase or rent supplies  [employer] for tub purchases and supplies  Waterbirthsolutions.com for tub purchases and supplies  [employer] (www.thelaborladies.com) $275 for tub rental/set-up & take down/kit   [employer] (http://www.fleming.com/.htm) Information regarding doulas (labor support) who provide pool rentals  Our practice has a Birth Pool in a Box tub at the hospital that you may borrow on a first-come-first-served basis. It is your responsibility to to set up, clean and break down the tub. We cannot guarantee the  availability of this tub in advance. You are responsible for bringing all accessories listed above. If you do not have all necessary supplies you cannot have a waterbirth.    Things that would prevent you from having a waterbirth:  Premature, <37wks  Previous cesarean birth  Presence of thick meconium-stained fluid  Multiple gestation (Twins, triplets, etc.)  Uncontrolled diabetes or gestational diabetes requiring medication  Hypertension requiring medication or diagnosis of pre-eclampsia  Heavy vaginal bleeding  Non-reassuring fetal heart rate  Active infection (MRSA, etc.). Group B Strep is NOT a contraindication for  waterbirth.  If your labor has to be induced and induction method requires continuous  monitoring of the baby's heart rate  Other risks/issues identified by your obstetrical provider  Please remember that birth is unpredictable. Under certain unforeseeable circumstances your provider may advise against giving birth in the tub. These decisions will be made on a case-by-case basis and with the safety of you and your baby as our highest priority.     Safe Medications in Pregnancy   Acne:  Benzoyl Peroxide  Salicylic Acid  Backache/Headache:  Tylenol: 2 regular strength every 4 hours OR        2 Extra strength every 6 hours   Colds/Coughs/Allergies:  Benadryl (alcohol free) 25 mg every 6 hours as needed  Breath right strips  Claritin  Cepacol throat lozenges  Chloraseptic throat spray  Cold-Eeze- up to three times per day  Cough drops, alcohol free  Flonase (by prescription only)  Guaifenesin  Mucinex  Robitussin DM (plain only, alcohol free)  Saline nasal spray/drops  Sudafed (pseudoephedrine) & Actifed * use only after [redacted] weeks gestation and if you do not have high blood pressure  Tylenol  Vicks Vaporub  Zinc lozenges  Zyrtec   Constipation:  Colace  Ducolax suppositories  Fleet enema  Glycerin suppositories  Metamucil  Milk of  magnesia  Miralax  Senokot  Smooth move tea   Diarrhea:  Kaopectate  Imodium A-D   *NO pepto Bismol   Hemorrhoids:  Anusol  Anusol HC  Preparation H  Tucks   Indigestion:  Tums  Maalox  Mylanta  Zantac  Pepcid   Insomnia:  Benadryl (alcohol free) 70m every 6 hours as needed  Tylenol PM  Unisom, no Gelcaps   Leg Cramps:  Tums  MagGel   Nausea/Vomiting:  Bonine  Dramamine  Emetrol  Ginger extract  Sea bands  Meclizine  Nausea medication to take during pregnancy:  Unisom (doxylamine succinate 25 mg tablets) Take one tablet daily at bedtime. If symptoms are not adequately controlled, the dose can be increased to a maximum recommended dose of two tablets daily (1/2 tablet in the morning, 1/2 tablet mid-afternoon and one at bedtime).  Vitamin B6 1092mtablets. Take one tablet twice a day (up to 200 mg per day).   Skin Rashes:  Aveeno products  Benadryl cream or 2556mvery 6 hours as needed  Calamine Lotion  1% cortisone cream   Yeast infection:  Gyne-lotrimin 7  Monistat 7    **If taking multiple medications, please check labels to avoid duplicating the same active ingredients  **take medication as directed on the label  ** Do not exceed 4000 mg of tylenol in 24 hours  **Do not take medications that contain aspirin or ibuprofen

## 2018-04-08 LAB — CERVICOVAGINAL ANCILLARY ONLY
Bacterial vaginitis: POSITIVE — AB
Candida vaginitis: POSITIVE — AB
Chlamydia: NEGATIVE
Neisseria Gonorrhea: NEGATIVE
Trichomonas: NEGATIVE

## 2018-04-09 LAB — URINE CULTURE, OB REFLEX

## 2018-04-09 LAB — CULTURE, OB URINE

## 2018-04-11 ENCOUNTER — Encounter (HOSPITAL_COMMUNITY): Payer: Self-pay

## 2018-04-11 ENCOUNTER — Ambulatory Visit (HOSPITAL_COMMUNITY)
Admission: RE | Admit: 2018-04-11 | Discharge: 2018-04-11 | Disposition: A | Discharge status: Home / Self Care | Payer: Medicaid Other | Source: Ambulatory Visit | Attending: Obstetrics and Gynecology | Admitting: Obstetrics and Gynecology

## 2018-04-11 ENCOUNTER — Ambulatory Visit (HOSPITAL_COMMUNITY): Payer: Medicaid Other | Admitting: *Deleted

## 2018-04-11 ENCOUNTER — Other Ambulatory Visit: Payer: Self-pay

## 2018-04-11 VITALS — BP 113/63 | HR 90 | Wt 193.2 lb

## 2018-04-11 DIAGNOSIS — O30049 Twin pregnancy, dichorionic/diamniotic, unspecified trimester: Secondary | ICD-10-CM | POA: Diagnosis not present

## 2018-04-11 DIAGNOSIS — O34219 Maternal care for unspecified type scar from previous cesarean delivery: Secondary | ICD-10-CM | POA: Diagnosis not present

## 2018-04-11 DIAGNOSIS — O99212 Obesity complicating pregnancy, second trimester: Secondary | ICD-10-CM | POA: Diagnosis not present

## 2018-04-11 DIAGNOSIS — O30042 Twin pregnancy, dichorionic/diamniotic, second trimester: Secondary | ICD-10-CM

## 2018-04-11 DIAGNOSIS — Z3490 Encounter for supervision of normal pregnancy, unspecified, unspecified trimester: Secondary | ICD-10-CM | POA: Insufficient documentation

## 2018-04-11 DIAGNOSIS — Z3A21 21 weeks gestation of pregnancy: Secondary | ICD-10-CM

## 2018-04-11 DIAGNOSIS — Z363 Encounter for antenatal screening for malformations: Secondary | ICD-10-CM

## 2018-04-11 DIAGNOSIS — O0932 Supervision of pregnancy with insufficient antenatal care, second trimester: Secondary | ICD-10-CM

## 2018-04-12 ENCOUNTER — Other Ambulatory Visit (HOSPITAL_COMMUNITY): Payer: Self-pay | Admitting: *Deleted

## 2018-04-12 ENCOUNTER — Encounter: Payer: Self-pay | Admitting: Medical

## 2018-04-12 DIAGNOSIS — O30042 Twin pregnancy, dichorionic/diamniotic, second trimester: Secondary | ICD-10-CM

## 2018-04-12 DIAGNOSIS — O365920 Maternal care for other known or suspected poor fetal growth, second trimester, not applicable or unspecified: Secondary | ICD-10-CM

## 2018-04-12 DIAGNOSIS — O30002 Twin pregnancy, unspecified number of placenta and unspecified number of amniotic sacs, second trimester: Secondary | ICD-10-CM | POA: Insufficient documentation

## 2018-04-12 DIAGNOSIS — R8271 Bacteriuria: Secondary | ICD-10-CM | POA: Insufficient documentation

## 2018-04-12 LAB — AFP, SERUM, OPEN SPINA BIFIDA
AFP MOM: 1.95
AFP Value: 93.2 ng/mL
Gest. Age on Collection Date: 20 weeks
Maternal Age At EDD: 23.3 yr
OSBR Risk 1 IN: 1858
Test Results:: NEGATIVE
Weight: 196 [lb_av]

## 2018-04-12 LAB — OBSTETRIC PANEL, INCLUDING HIV
ANTIBODY SCREEN: NEGATIVE
BASOS: 0 %
Basophils Absolute: 0 10*3/uL (ref 0.0–0.2)
EOS (ABSOLUTE): 0.1 10*3/uL (ref 0.0–0.4)
Eos: 1 %
HEMATOCRIT: 30.5 % — AB (ref 34.0–46.6)
HIV SCREEN 4TH GENERATION: NONREACTIVE
Hemoglobin: 10.4 g/dL — ABNORMAL LOW (ref 11.1–15.9)
Hepatitis B Surface Ag: NEGATIVE
IMMATURE GRANS (ABS): 0 10*3/uL (ref 0.0–0.1)
Immature Granulocytes: 0 %
LYMPHS: 31 %
Lymphocytes Absolute: 2.3 10*3/uL (ref 0.7–3.1)
MCH: 29.6 pg (ref 26.6–33.0)
MCHC: 34.1 g/dL (ref 31.5–35.7)
MCV: 87 fL (ref 79–97)
MONOS ABS: 0.5 10*3/uL (ref 0.1–0.9)
Monocytes: 6 %
NEUTROS ABS: 4.5 10*3/uL (ref 1.4–7.0)
Neutrophils: 62 %
Platelets: 262 10*3/uL (ref 150–450)
RBC: 3.51 x10E6/uL — ABNORMAL LOW (ref 3.77–5.28)
RDW: 14.2 % (ref 11.7–15.4)
RPR Ser Ql: NONREACTIVE
Rh Factor: POSITIVE
Rubella Antibodies, IGG: 1.25 index (ref >0.99)
WBC: 7.4 10*3/uL (ref 3.4–10.8)

## 2018-04-12 LAB — SMN1 COPY NUMBER ANALYSIS (SMA CARRIER SCREENING)

## 2018-04-12 MED ORDER — TERCONAZOLE 0.8 % VA CREA: 1.0000 | TOPICAL_CREAM | Freq: Every day | VAGINAL | 0 refills | Status: DC

## 2018-04-12 MED ORDER — METRONIDAZOLE 500 MG PO TABS: 500.0000 mg | ORAL_TABLET | Freq: Two times a day (BID) | ORAL | 0 refills | Status: DC

## 2018-04-12 MED ORDER — AMOXICILLIN 500 MG PO CAPS: 500.0000 mg | ORAL_CAPSULE | Freq: Three times a day (TID) | ORAL | 0 refills | Status: DC

## 2018-04-12 NOTE — Addendum Note (Signed)
Addended by: Marny Lowenstein on: 04/12/2018 10:33 AM   Modules accepted: Orders

## 2018-04-16 ENCOUNTER — Encounter: Payer: Self-pay | Admitting: Medical

## 2018-04-22 ENCOUNTER — Encounter: Payer: Self-pay | Admitting: Obstetrics and Gynecology

## 2018-04-28 ENCOUNTER — Inpatient Hospital Stay (HOSPITAL_COMMUNITY)
Admission: AD | Admit: 2018-04-28 | Discharge: 2018-04-28 | Disposition: A | Discharge status: Home / Self Care | Payer: Medicaid Other | Attending: Obstetrics and Gynecology | Admitting: Obstetrics and Gynecology

## 2018-04-28 ENCOUNTER — Other Ambulatory Visit: Payer: Self-pay

## 2018-04-28 ENCOUNTER — Encounter (HOSPITAL_COMMUNITY): Payer: Self-pay

## 2018-04-28 DIAGNOSIS — Z3A24 24 weeks gestation of pregnancy: Secondary | ICD-10-CM | POA: Insufficient documentation

## 2018-04-28 DIAGNOSIS — O9A212 Injury, poisoning and certain other consequences of external causes complicating pregnancy, second trimester: Secondary | ICD-10-CM

## 2018-04-28 DIAGNOSIS — O30042 Twin pregnancy, dichorionic/diamniotic, second trimester: Secondary | ICD-10-CM | POA: Insufficient documentation
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
## 2018-05-02 ENCOUNTER — Encounter: Payer: Self-pay | Admitting: Obstetrics and Gynecology

## 2018-05-02 ENCOUNTER — Encounter: Payer: Medicaid Other | Admitting: Obstetrics and Gynecology

## 2018-05-02 NOTE — Progress Notes (Signed)
Patient did not answer telephone x2 for OB tele visit today. I called as well as the staff. She will be contacted to reschedule.   [name], M.D. Attending [facility] [employer] [facility])

## 2018-05-03 ENCOUNTER — Encounter: Payer: Medicaid Other | Admitting: Medical

## 2018-05-10 ENCOUNTER — Encounter (HOSPITAL_COMMUNITY): Payer: Self-pay

## 2018-05-10 ENCOUNTER — Ambulatory Visit (HOSPITAL_COMMUNITY): Payer: Medicaid Other | Admitting: *Deleted

## 2018-05-10 ENCOUNTER — Other Ambulatory Visit: Payer: Self-pay

## 2018-05-10 ENCOUNTER — Ambulatory Visit (HOSPITAL_COMMUNITY)
Admission: RE | Admit: 2018-05-10 | Discharge: 2018-05-10 | Disposition: A | Discharge status: Home / Self Care | Payer: Medicaid Other | Source: Ambulatory Visit | Attending: Obstetrics and Gynecology | Admitting: Obstetrics and Gynecology

## 2018-05-10 VITALS — Temp 98.1°F

## 2018-05-10 DIAGNOSIS — O0932 Supervision of pregnancy with insufficient antenatal care, second trimester: Secondary | ICD-10-CM | POA: Diagnosis not present

## 2018-05-10 DIAGNOSIS — Z362 Encounter for other antenatal screening follow-up: Secondary | ICD-10-CM | POA: Diagnosis not present

## 2018-05-10 DIAGNOSIS — O30049 Twin pregnancy, dichorionic/diamniotic, unspecified trimester: Secondary | ICD-10-CM | POA: Diagnosis present

## 2018-05-10 DIAGNOSIS — Z3A25 25 weeks gestation of pregnancy: Secondary | ICD-10-CM

## 2018-05-10 DIAGNOSIS — O99212 Obesity complicating pregnancy, second trimester: Secondary | ICD-10-CM

## 2018-05-10 DIAGNOSIS — O30042 Twin pregnancy, dichorionic/diamniotic, second trimester: Secondary | ICD-10-CM | POA: Insufficient documentation

## 2018-05-10 DIAGNOSIS — O34219 Maternal care for unspecified type scar from previous cesarean delivery: Secondary | ICD-10-CM | POA: Diagnosis not present

## 2018-05-14 ENCOUNTER — Other Ambulatory Visit (HOSPITAL_COMMUNITY): Payer: Self-pay | Admitting: *Deleted

## 2018-05-14 DIAGNOSIS — O30043 Twin pregnancy, dichorionic/diamniotic, third trimester: Secondary | ICD-10-CM

## 2018-05-23 ENCOUNTER — Other Ambulatory Visit: Payer: Self-pay | Admitting: Obstetrics and Gynecology

## 2018-05-23 DIAGNOSIS — O30043 Twin pregnancy, dichorionic/diamniotic, third trimester: Secondary | ICD-10-CM

## 2018-05-23 DIAGNOSIS — O0993 Supervision of high risk pregnancy, unspecified, third trimester: Secondary | ICD-10-CM

## 2018-05-28 ENCOUNTER — Other Ambulatory Visit: Payer: Medicaid Other

## 2018-05-28 ENCOUNTER — Encounter: Payer: Medicaid Other | Admitting: Obstetrics and Gynecology

## 2018-05-31 ENCOUNTER — Encounter: Payer: Medicaid Other | Admitting: Obstetrics & Gynecology

## 2018-05-31 ENCOUNTER — Other Ambulatory Visit: Payer: Medicaid Other

## 2018-06-04 ENCOUNTER — Other Ambulatory Visit: Payer: Medicaid Other

## 2018-06-04 ENCOUNTER — Encounter: Payer: Medicaid Other | Admitting: Obstetrics and Gynecology

## 2018-06-07 ENCOUNTER — Other Ambulatory Visit (HOSPITAL_COMMUNITY): Payer: Self-pay | Admitting: *Deleted

## 2018-06-07 ENCOUNTER — Ambulatory Visit (HOSPITAL_COMMUNITY): Payer: Medicaid Other | Admitting: *Deleted

## 2018-06-07 ENCOUNTER — Ambulatory Visit (HOSPITAL_COMMUNITY)
Admission: RE | Admit: 2018-06-07 | Discharge: 2018-06-07 | Disposition: A | Discharge status: Home / Self Care | Payer: Medicaid Other | Source: Ambulatory Visit | Attending: Obstetrics and Gynecology | Admitting: Obstetrics and Gynecology

## 2018-06-07 ENCOUNTER — Other Ambulatory Visit: Payer: Self-pay

## 2018-06-07 ENCOUNTER — Encounter (HOSPITAL_COMMUNITY): Payer: Self-pay | Admitting: *Deleted

## 2018-06-07 DIAGNOSIS — O0993 Supervision of high risk pregnancy, unspecified, third trimester: Secondary | ICD-10-CM | POA: Diagnosis present

## 2018-06-07 DIAGNOSIS — O0933 Supervision of pregnancy with insufficient antenatal care, third trimester: Secondary | ICD-10-CM | POA: Diagnosis not present

## 2018-06-07 DIAGNOSIS — Z363 Encounter for antenatal screening for malformations: Secondary | ICD-10-CM | POA: Diagnosis not present

## 2018-06-07 DIAGNOSIS — O99213 Obesity complicating pregnancy, third trimester: Secondary | ICD-10-CM

## 2018-06-07 DIAGNOSIS — Z3A29 29 weeks gestation of pregnancy: Secondary | ICD-10-CM

## 2018-06-07 DIAGNOSIS — O30043 Twin pregnancy, dichorionic/diamniotic, third trimester: Secondary | ICD-10-CM | POA: Insufficient documentation

## 2018-06-07 DIAGNOSIS — O34219 Maternal care for unspecified type scar from previous cesarean delivery: Secondary | ICD-10-CM | POA: Diagnosis not present

## 2018-06-07 DIAGNOSIS — O30049 Twin pregnancy, dichorionic/diamniotic, unspecified trimester: Secondary | ICD-10-CM

## 2018-06-07 NOTE — Progress Notes (Signed)
[phone]  

## 2018-06-09 ENCOUNTER — Inpatient Hospital Stay (HOSPITAL_COMMUNITY)
Admission: AD | Admit: 2018-06-09 | Discharge: 2018-06-09 | Disposition: A | Discharge status: Home / Self Care | Payer: Medicaid Other | Attending: Obstetrics and Gynecology | Admitting: Obstetrics and Gynecology

## 2018-06-09 ENCOUNTER — Other Ambulatory Visit: Payer: Self-pay

## 2018-06-09 ENCOUNTER — Encounter (HOSPITAL_COMMUNITY): Payer: Self-pay | Admitting: *Deleted

## 2018-06-09 DIAGNOSIS — O479 False labor, unspecified: Secondary | ICD-10-CM

## 2018-06-09 DIAGNOSIS — O98813 Other maternal infectious and parasitic diseases complicating pregnancy, third trimester: Secondary | ICD-10-CM | POA: Diagnosis not present

## 2018-06-09 DIAGNOSIS — O26893 Other specified pregnancy related conditions, third trimester: Secondary | ICD-10-CM | POA: Insufficient documentation

## 2018-06-09 DIAGNOSIS — Z3A30 30 weeks gestation of pregnancy: Secondary | ICD-10-CM | POA: Insufficient documentation

## 2018-06-09 DIAGNOSIS — B373 Candidiasis of vulva and vagina: Secondary | ICD-10-CM | POA: Diagnosis not present

## 2018-06-09 DIAGNOSIS — R0602 Shortness of breath: Secondary | ICD-10-CM | POA: Insufficient documentation

## 2018-06-09 DIAGNOSIS — O30003 Twin pregnancy, unspecified number of placenta and unspecified number of amniotic sacs, third trimester: Secondary | ICD-10-CM | POA: Insufficient documentation

## 2018-06-09 DIAGNOSIS — O23593 Infection of other part of genital tract in pregnancy, third trimester: Secondary | ICD-10-CM | POA: Diagnosis not present

## 2018-06-09 DIAGNOSIS — B3731 Acute candidiasis of vulva and vagina: Secondary | ICD-10-CM

## 2018-06-09 DIAGNOSIS — Z87891 Personal history of nicotine dependence: Secondary | ICD-10-CM | POA: Diagnosis not present

## 2018-06-09 DIAGNOSIS — B9689 Other specified bacterial agents as the cause of diseases classified elsewhere: Secondary | ICD-10-CM

## 2018-06-09 DIAGNOSIS — O0993 Supervision of high risk pregnancy, unspecified, third trimester: Secondary | ICD-10-CM

## 2018-06-09 DIAGNOSIS — N760 Acute vaginitis: Secondary | ICD-10-CM

## 2018-06-09 LAB — URINALYSIS, ROUTINE W REFLEX MICROSCOPIC
Bacteria, UA: NONE SEEN
Bilirubin Urine: NEGATIVE
Glucose, UA: NEGATIVE mg/dL
Hgb urine dipstick: NEGATIVE
Ketones, ur: NEGATIVE mg/dL
Nitrite: NEGATIVE
Protein, ur: NEGATIVE mg/dL
Specific Gravity, Urine: 1.011 (ref 1.005–1.030)
pH: 6 (ref 5.0–8.0)

## 2018-06-09 LAB — WET PREP, GENITAL
Sperm: NONE SEEN
Trich, Wet Prep: NONE SEEN

## 2018-06-09 MED ORDER — METRONIDAZOLE 500 MG PO TABS: 500.0000 mg | ORAL_TABLET | Freq: Two times a day (BID) | ORAL | 0 refills | Status: DC

## 2018-06-09 MED ORDER — TERCONAZOLE 0.4 % VA CREA: 1.0000 | TOPICAL_CREAM | Freq: Every day | VAGINAL | 0 refills | Status: DC

## 2018-06-09 NOTE — MAU Provider Note (Signed)
History     CSN: [id number]  Arrival date and time: 06/09/18 2129   First Provider Initiated Contact with Patient 06/09/18 2245      Chief Complaint  Patient presents with  . Contractions  . Abdominal Pain  . Shortness of Breath   Sarah Schmitt is a 23 y.o. [phone] at [redacted]w[redacted]d who presents for Contractions; Abdominal Pain; and Shortness of Breath.  She reports the contractions have "been here, but I have gotten tired of ignoring them."  She reports contractions are "ongoing at different times all day."  Patient endorses fetal movement and denies vaginal concerns.  She reports the abdominal pain is a cramping and reports "that is with the contractions."  She also reports that the SOB has been ongoing and states that "nothing helps" including position changes, drinking water, or walking. When asked to clarify if she was referring to contractions or SOB she reports "both."   Patient reports last baby was born 7-8 months ago and one prior to that will be 2 in July.      OB History    Gravida  4   Para  3   Term  3   Preterm      AB  0   Living  3     SAB  0   TAB  0   Ectopic      Multiple  0   Live Births  3           Past Medical History:  Diagnosis Date  . Anemia   . Dyspnea   . Hypotension     Past Surgical History:  Procedure Laterality Date  . c sections    . CESAREAN SECTION    . CESAREAN SECTION N/A 08/28/2016   Procedure: CESAREAN SECTION;  Surgeon: Adam Phenix, MD;  Location: [facility] BIRTHING SUITES;  Service: Obstetrics;  Laterality: N/A;  . CESAREAN SECTION N/A 10/02/2017   Procedure: REPEAT CESAREAN SECTION;  Surgeon: Catalina Antigua, MD;  Location: [facility] BIRTHING SUITES;  Service: Obstetrics;  Laterality: N/A;    Family History  Problem Relation Age of Onset  . Diabetes Father   . Hypertension Maternal Grandmother   . Diabetes Maternal Grandmother   . Diabetes Paternal Grandmother   . Hypertension Paternal Grandmother   . Diabetes Mother      Social History   Tobacco Use  . Smoking status: Former Smoker    Types: Cigarettes    Last attempt to quit: 12/11/2017    Years since quitting: 0.4  . Smokeless tobacco: Never Used  Substance Use Topics  . Alcohol use: No  . Drug use: No    Allergies: No Known Allergies  Medications Prior to Admission  Medication Sig Dispense Refill Last Dose  . Prenat-FeCbn-FeAspGl-FA-Omega (OB COMPLETE PETITE) 35-5-1-200 MG CAPS Take 1 tablet by mouth daily. 30 capsule 12 Past Week at Unknown time  . amoxicillin (AMOXIL) 500 MG capsule Take 1 capsule (500 mg total) by mouth 3 (three) times daily. (Patient not taking: Reported on 05/10/2018) 15 capsule 0 More than a month at Unknown time  . Cetirizine HCl (ZYRTEC PO) Take by mouth.   More than a month at Unknown time  . diphenhydrAMINE HCl (BENADRYL PO) Take by mouth.   More than a month at Unknown time  . docusate sodium (COLACE) 100 MG capsule Take 1 capsule (100 mg total) by mouth 2 (two) times daily as needed for mild constipation or moderate constipation. (Patient not taking: Reported on  05/10/2018) 30 capsule 2 More than a month at Unknown time  . ferrous sulfate (FERROUSUL) 325 (65 FE) MG tablet Take 1 tablet (325 mg total) by mouth 2 (two) times daily. (Patient not taking: Reported on 05/10/2018) 60 tablet 1 More than a month at Unknown time  . metroNIDAZOLE (FLAGYL) 500 MG tablet Take 1 tablet (500 mg total) by mouth 2 (two) times daily. (Patient not taking: Reported on 05/10/2018) 14 tablet 0 More than a month at Unknown time  . terconazole (TERAZOL 3) 0.8 % vaginal cream Place 1 applicator vaginally at bedtime. (Patient not taking: Reported on 05/10/2018) 20 g 0 More than a month at Unknown time    Review of Systems Physical Exam   Blood pressure 103/67, pulse 86, temperature 98.3 F (36.8 C), temperature source Oral, resp. rate 20, weight 88.7 kg, last menstrual period 11/10/2017, unknown if currently breastfeeding.  Physical Exam   Constitutional: She is oriented to person, place, and time. She appears well-developed and well-nourished.  HENT:  Head: Normocephalic and atraumatic.  Eyes: Conjunctivae are normal.  Neck: Normal range of motion.  Cardiovascular: Normal rate.  Respiratory: Effort normal.  GI: Soft. There is no abdominal tenderness. There is no guarding.  Genitourinary: Cervix exhibits no motion tenderness, no discharge and no friability.    Vaginal discharge present.     No vaginal bleeding.  No bleeding in the vagina.    Genitourinary Comments: Sterile Speculum Exam: -Vaginal Vault: Pink mucosa.  Moderate amt greenish thin curdy discharge -wet prep collected -Cervix:Pink, no lesions, cysts, or polyps.  Appears closed. No active bleeding from os-GC/CT collected -Bimanual Exam: Closed     Musculoskeletal: Normal range of motion.  Neurological: She is alert and oriented to person, place, and time.  Skin: Skin is warm and dry.  Psychiatric: She has a normal mood and affect. Her behavior is normal.    Fetal Assessment 150 bpm, Mod Var, -Decels, +10x10 Accels 145 bpm, Mod Var, -Decels, +10x10 Accels Toco: Irritability Noted  MAU Course   Results for orders placed or performed during the hospital encounter of 06/09/18 (from the past 24 hour(s))  Urinalysis, Routine w reflex microscopic     Status: Abnormal   Collection Time: 06/09/18 10:00 PM  Result Value Ref Range   Color, Urine YELLOW YELLOW   APPearance CLEAR CLEAR   Specific Gravity, Urine 1.011 1.005 - 1.030   pH 6.0 5.0 - 8.0   Glucose, UA NEGATIVE NEGATIVE mg/dL   Hgb urine dipstick NEGATIVE NEGATIVE   Bilirubin Urine NEGATIVE NEGATIVE   Ketones, ur NEGATIVE NEGATIVE mg/dL   Protein, ur NEGATIVE NEGATIVE mg/dL   Nitrite NEGATIVE NEGATIVE   Leukocytes,Ua TRACE (A) NEGATIVE   RBC / HPF 0-5 0 - 5 RBC/hpf   WBC, UA 0-5 0 - 5 WBC/hpf   Bacteria, UA NONE SEEN NONE SEEN   Squamous Epithelial / LPF 0-5 0 - 5   Mucus PRESENT   Wet prep,  genital     Status: Abnormal   Collection Time: 06/09/18 11:00 PM  Result Value Ref Range   Yeast Wet Prep HPF POC PRESENT (A) NONE SEEN   Trich, Wet Prep NONE SEEN NONE SEEN   Clue Cells Wet Prep HPF POC PRESENT (A) NONE SEEN   WBC, Wet Prep HPF POC MANY (A) NONE SEEN   Sperm NONE SEEN    No results found.  MDM PE Labs:UA, GC/CT, Wet Prep EFM  Assessment and Plan  23 year old G4P3003 at 30.1weeks Cat  I FT x2 Contractions SOB Candidiasis of Vagina  -Exam findings discussed -Informed that findings significant for yeast infection and will treat accordingly. -Rx for Terazol 0.4% to be sent to pharmacy on file.  -Discussion regarding normal pregnancy complaints with expected variances for twin pregnancy, short intervals between pregnancies, and  -Discussed techniques for decreasing feelings of SOB including position changes, elevation with pillows, and frequent rest. -Patient verbalizes understanding. -Will await results  Follow Up (23:50 AM) Bacterial Vaginosis  -NST reactive x 2 -Wet prep returns significant for clue cells and yeast -Results discussed with patient -Rx for Metronidazole tablets sent to pharmacy on file.  -Informed that GC/CT will take 2-3 days to return. -No questions or concerns. -Encouraged to call or return to MAU if symptoms worsen or with the onset of new symptoms. -Discharged to home in stable condition  Cherre Robins MSN, CNM 06/09/2018, 10:45 PM

## 2018-06-09 NOTE — Discharge Instructions (Signed)
Bacterial Vaginosis    Bacterial vaginosis is a vaginal infection that occurs when the normal balance of bacteria in the vagina is disrupted. It results from an overgrowth of certain bacteria. This is the most common vaginal infection among women ages 15-44.  Because bacterial vaginosis increases your risk for STIs (sexually transmitted infections), getting treated can help reduce your risk for chlamydia, gonorrhea, herpes, and HIV (human immunodeficiency virus). Treatment is also important for preventing complications in pregnant women, because this condition can cause an early (premature) delivery.  What are the causes?  This condition is caused by an increase in harmful bacteria that are normally present in small amounts in the vagina. However, the reason that the condition develops is not fully understood.  What increases the risk?  The following factors may make you more likely to develop this condition:  · Having a new sexual partner or multiple sexual partners.  · Having unprotected sex.  · Douching.  · Having an intrauterine device (IUD).  · Smoking.  · Drug and alcohol abuse.  · Taking certain antibiotic medicines.  · Being pregnant.  You cannot get bacterial vaginosis from toilet seats, bedding, swimming pools, or contact with objects around you.  What are the signs or symptoms?  Symptoms of this condition include:  · Grey or white vaginal discharge. The discharge can also be watery or foamy.  · A fish-like odor with discharge, especially after sexual intercourse or during menstruation.  · Itching in and around the vagina.  · Burning or pain with urination.  Some women with bacterial vaginosis have no signs or symptoms.  How is this diagnosed?  This condition is diagnosed based on:  · Your medical history.  · A physical exam of the vagina.  · Testing a sample of vaginal fluid under a microscope to look for a large amount of bad bacteria or abnormal cells. Your health care provider may use a cotton swab or  a small wooden spatula to collect the sample.  How is this treated?  This condition is treated with antibiotics. These may be given as a pill, a vaginal cream, or a medicine that is put into the vagina (suppository). If the condition comes back after treatment, a second round of antibiotics may be needed.  Follow these instructions at home:  Medicines  · Take over-the-counter and prescription medicines only as told by your health care provider.  · Take or use your antibiotic as told by your health care provider. Do not stop taking or using the antibiotic even if you start to feel better.  General instructions  · If you have a female sexual partner, tell her that you have a vaginal infection. She should see her health care provider and be treated if she has symptoms. If you have a female sexual partner, he does not need treatment.  · During treatment:  ? Avoid sexual activity until you finish treatment.  ? Do not douche.  ? Avoid alcohol as directed by your health care provider.  ? Avoid breastfeeding as directed by your health care provider.  · Drink enough water and fluids to keep your urine clear or pale yellow.  · Keep the area around your vagina and rectum clean.  ? Wash the area daily with warm water.  ? Wipe yourself from front to back after using the toilet.  · Keep all follow-up visits as told by your health care provider. This is important.  How is this prevented?  · Do not   douche.  · Wash the outside of your vagina with warm water only.  · Use protection when having sex. This includes latex condoms and dental dams.  · Limit how many sexual partners you have. To help prevent bacterial vaginosis, it is best to have sex with just one partner (monogamous).  · Make sure you and your sexual partner are tested for STIs.  · Wear cotton or cotton-lined underwear.  · Avoid wearing tight pants and pantyhose, especially during summer.  · Limit the amount of alcohol that you drink.  · Do not use any products that contain  nicotine or tobacco, such as cigarettes and e-cigarettes. If you need help quitting, ask your health care provider.  · Do not use illegal drugs.  Where to find more information  · Centers for Disease Control and Prevention: www.cdc.gov/std  · American Sexual Health Association (ASHA): www.ashastd.org  · U.S. Department of Health and Human Services, Office on Women's Health: www.womenshealth.gov/ or https://www.womenshealth.gov/a-z-topics/bacterial-vaginosis  Contact a health care provider if:  · Your symptoms do not improve, even after treatment.  · You have more discharge or pain when urinating.  · You have a fever.  · You have pain in your abdomen.  · You have pain during sex.  · You have vaginal bleeding between periods.  Summary  · Bacterial vaginosis is a vaginal infection that occurs when the normal balance of bacteria in the vagina is disrupted.  · Because bacterial vaginosis increases your risk for STIs (sexually transmitted infections), getting treated can help reduce your risk for chlamydia, gonorrhea, herpes, and HIV (human immunodeficiency virus). Treatment is also important for preventing complications in pregnant women, because the condition can cause an early (premature) delivery.  · This condition is treated with antibiotic medicines. These may be given as a pill, a vaginal cream, or a medicine that is put into the vagina (suppository).  This information is not intended to replace advice given to you by your health care provider. Make sure you discuss any questions you have with your health care provider.  Document Released: 01/16/2005 Document Revised: 05/22/2016 Document Reviewed: 10/02/2015  Elsevier Interactive Patient Education © 2019 [employer]

## 2018-06-09 NOTE — MAU Note (Signed)
Pt reports to MAU c/o SOB and ctx. Pt reports ctxs today and for the last few weeks but pt states she is over ignoring the pain. Pt denies LOF or bleeding. +FM.

## 2018-06-10 ENCOUNTER — Other Ambulatory Visit: Payer: Medicaid Other

## 2018-06-10 ENCOUNTER — Encounter: Payer: Medicaid Other | Admitting: Obstetrics and Gynecology

## 2018-06-10 LAB — GC/CHLAMYDIA PROBE AMP (~~LOC~~) NOT AT ARMC
Chlamydia: NEGATIVE
Neisseria Gonorrhea: NEGATIVE

## 2018-06-13 ENCOUNTER — Encounter: Payer: Medicaid Other | Admitting: Obstetrics and Gynecology

## 2018-06-17 ENCOUNTER — Encounter: Payer: Self-pay | Admitting: Obstetrics & Gynecology

## 2018-06-17 ENCOUNTER — Other Ambulatory Visit: Payer: Self-pay

## 2018-06-17 ENCOUNTER — Ambulatory Visit (INDEPENDENT_AMBULATORY_CARE_PROVIDER_SITE_OTHER): Payer: Medicaid Other | Admitting: Obstetrics & Gynecology

## 2018-06-17 VITALS — BP 115/71 | HR 69 | Wt 195.0 lb

## 2018-06-17 DIAGNOSIS — Z3A31 31 weeks gestation of pregnancy: Secondary | ICD-10-CM

## 2018-06-17 DIAGNOSIS — O30043 Twin pregnancy, dichorionic/diamniotic, third trimester: Secondary | ICD-10-CM

## 2018-06-17 DIAGNOSIS — O0993 Supervision of high risk pregnancy, unspecified, third trimester: Secondary | ICD-10-CM

## 2018-06-17 NOTE — Patient Instructions (Signed)
Return to office for any scheduled appointments. Call the office or go to the MAU at [facility] if:  You begin to have strong, frequent contractions  Your water breaks.  Sometimes it is a big gush of fluid, sometimes it is just a trickle that keeps getting your panties wet or running down your legs  You have vaginal bleeding.  It is normal to have a small amount of spotting if your cervix was checked.   You do not feel your baby moving like normal.  If you do not, get something to eat and drink and lay down and focus on feeling your baby move.   If your baby is still not moving like normal, you should call the office or go to MAU.  Any other obstetric concerns.   

## 2018-06-17 NOTE — Progress Notes (Signed)
C/O : Back pain and was seen ant MAU for Contractions on 06/09/18

## 2018-06-17 NOTE — Progress Notes (Signed)
PRENATAL VISIT NOTE  Subjective:  Sarah Schmitt is a 23 y.o. [phone] at [redacted]w[redacted]d being seen today for ongoing prenatal care.  She is currently monitored for the following issues for this high-risk pregnancy and has History of cesarean delivery x 3 affecting pregnancy; Late prenatal care; Anemia in pregnancy, second trimester; Supervision of high-risk pregnancy; Twin gestation, dichorionic diamniotic; and Group B streptococcal bacteriuria on their problem list.  Patient reports fatigue.  Contractions: Irritability. Vag. Bleeding: None.  Movement: Present. Denies leaking of fluid.   The following portions of the patient's history were reviewed and updated as appropriate: allergies, current medications, past family history, past medical history, past social history, past surgical history and problem list.   Objective:   Vitals:   06/17/18 1608  BP: 115/71  Pulse: 69  Weight: 195 lb (88.5 kg)    Fetal Status: Fetal Heart Rate (bpm): A:142 B: 152   Movement: Present     General:  Alert, oriented and cooperative. Patient is in no acute distress.  Skin: Skin is warm and dry. No rash noted.   Cardiovascular: Normal heart rate noted  Respiratory: Normal respiratory effort, no problems with respiration noted  Abdomen: Soft, gravid, appropriate for gestational age.  Pain/Pressure: Absent     Pelvic: Cervical exam deferred        Extremities: Normal range of motion.  Edema: None  Mental Status: Normal mood and affect. Normal behavior. Normal judgment and thought content.   Imaging: [place] Mfm Ob Follow Up  Result Date: 06/07/2018 ----------------------------------------------------------------------  OBSTETRICS REPORT                       (Signed Final 06/07/2018 04:49 pm) ---------------------------------------------------------------------- Patient Info  ID #:       [id number]                          D.O.B.:  20-Oct-1995 (23 yrs)  Name:       Sarah Schmitt [facility]            Visit Date:  06/07/2018 01:31 pm ---------------------------------------------------------------------- Performed By  Performed By:     Eden Lathe BS      Ref. Address:     [address]                    [address]                                                             [address]  Attending:        Noralee Space MD        [facility]  Referred By:      [facility] ---------------------------------------------------------------------- Orders   #  Description  Code         Ordered By   1  [place] MFM OB FOLLOW UP                  [id number]     RAVI SHANKAR   2  [place] MFM OB FOLLOW UP ADDL             [id number]     RAVI SHANKAR      GEST  ----------------------------------------------------------------------   #  Order #                    Accession #                 Episode #   1  [id number]                  [id number]                  [id number]   2  [id number]                  [id number]                  [id number]  ---------------------------------------------------------------------- Indications   Encounter for other antenatal screening        Z36.2   follow-up  (low risk panorama, AFP negative)   Twin pregnancy, di/di, third trimester         O30.043   Late to prenatal care, third trimester         O09.33   Obesity complicating pregnancy, third          O99.213   trimester (pregravid BMI 31)   Previous cesarean delivery, antepartum x 3     O34.219   [redacted] weeks gestation of pregnancy                Z3A.29  ---------------------------------------------------------------------- Fetal Evaluation (Fetus A)  Num Of Fetuses:         2  Fetal Heart Rate(bpm):  145  Cardiac Activity:       Observed  Fetal Lie:              Maternal left side  Presentation:           Cephalic  Placenta:               Anterior  P. Cord Insertion:      Visualized  Membrane Desc:      Dividing Membrane seen - Dichorionic.  Amniotic Fluid  AFI FV:       Within normal limits                              Largest Pocket(cm)                              7.7 ---------------------------------------------------------------------- Biometry (Fetus A)  BPD:      74.1  mm     G. Age:  29w 5d         33  %    CI:        73.79   %    70 - 86  FL/HC:      19.2   %    19.2 - 21.4  HC:       274   mm     G. Age:  29w 6d         19  %    HC/AC:      1.10        0.99 - 1.21  AC:      249.3  mm     G. Age:  29w 1d         24  %    FL/BPD:     70.9   %    71 - 87  FL:       52.5  mm     G. Age:  28w 0d          3  %    FL/AC:      21.1   %    20 - 24  HUM:      46.2  mm     G. Age:  27w 2d        < 5  %  Est. FW:    1297  gm    2 lb 14 oz      32  %     FW Discordancy        10  % ---------------------------------------------------------------------- OB History  Gravidity:    4         Term:   3        Prem:   0        SAB:   0  TOP:          0       Ectopic:  0        Living: 3 ---------------------------------------------------------------------- Gestational Age (Fetus A)  LMP:           29w 6d        Date:  11/10/17                 EDD:   08/17/18  U/S Today:     29w 1d                                        EDD:   08/22/18  Best:          29w 6d     Det. By:  LMP  (11/10/17)          EDD:   08/17/18 ---------------------------------------------------------------------- Anatomy (Fetus A)  Cranium:               Appears normal         Aortic Arch:            Previously seen  Cavum:                 Appears normal         Ductal Arch:            Previously seen  Ventricles:            Appears normal         Diaphragm:              Previously seen  Choroid Plexus:        Previously seen        Stomach:  Appears normal, left                                                                        sided  Cerebellum:            Previously seen        Abdomen:                Appears normal  Posterior Fossa:        Previously seen        Abdominal Wall:         Appears nml (cord                                                                        insert, abd wall)  Nuchal Fold:           Not applicable (>20    Cord Vessels:           Appears normal ([redacted]                         wks GA)                                        vessel cord)  Face:                  Absent nasal bone      Kidneys:                Appear normal  Lips:                  Appears normal         Bladder:                Appears normal  Thoracic:              Appears normal         Spine:                  Previously seen  Heart:                 Appears normal         Upper Extremities:      Previously seen                         (4CH, axis, and                         situs)  RVOT:                  Appears normal         Lower Extremities:      Previously seen  LVOT:  Appears normal  Other:  Heels and 5th digit previously visualized. ---------------------------------------------------------------------- Fetal Evaluation (Fetus B)  Num Of Fetuses:         2  Fetal Heart Rate(bpm):  151  Cardiac Activity:       Observed  Fetal Lie:              Maternal right side  Presentation:           Variable  Placenta:               Anterior  P. Cord Insertion:      Visualized  Membrane Desc:      Dividing Membrane seen - Dichorionic.  Amniotic Fluid  AFI FV:      Polyhydramnios                              Largest Pocket(cm)                              11.16 ---------------------------------------------------------------------- Biometry (Fetus B)  BPD:      76.6  mm     G. Age:  30w 5d         66  %    CI:        72.26   %    70 - 86                                                          FL/HC:      19.1   %    19.2 - 21.4  HC:      286.7  mm     G. Age:  31w 3d         63  %    HC/AC:      1.12        0.99 - 1.21  AC:      255.9  mm     G. Age:  29w 6d         42  %    FL/BPD:     71.5   %    71 - 87  FL:       54.8  mm     G. Age:  28w 6d         14   %    FL/AC:      21.4   %    20 - 24  HUM:      50.1  mm     G. Age:  29w 3d         41  %  Est. FW:    1445  gm      3 lb 3 oz     51  %     FW Discordancy     0 \ 10 % ---------------------------------------------------------------------- Gestational Age (Fetus B)  LMP:           29w 6d        Date:  11/10/17                 EDD:   08/17/18  U/S Today:     30w 2d  EDD:   08/14/18  Best:          29w 6d     Det. By:  LMP  (11/10/17)          EDD:   08/17/18 ---------------------------------------------------------------------- Anatomy (Fetus B)  Cranium:               Appears normal         Aortic Arch:            Previously seen  Cavum:                 Appears normal         Ductal Arch:            Previously seen  Ventricles:            Previously seen        Diaphragm:              Previously seen  Choroid Plexus:        Previously seen        Stomach:                Appears normal, left                                                                        sided  Cerebellum:            Previously seen        Abdomen:                Appears normal  Posterior Fossa:       Previously seen        Abdominal Wall:         Appears nml (cord                                                                        insert, abd wall)  Nuchal Fold:           Not applicable (>20    Cord Vessels:           Previously seen                         wks GA)  Face:                  Appears normal         Kidneys:                Previously seen                         (orbits and profile)  Lips:                  Appears normal         Bladder:                Appears  normal  Thoracic:              Appears normal         Spine:                  Previously seen  Heart:                 Appears normal         Upper Extremities:      Previously seen                         (4CH, axis, and                         situs)  RVOT:                  Appears normal         Lower Extremities:      Previously  seen  LVOT:                  Appears normal  Other:  Heels previously visualized. Nasal bone and 5th digit previously          visualized. ---------------------------------------------------------------------- Cervix Uterus Adnexa  Cervix  Not visualized (advanced GA >24wks)  Uterus  No abnormality visualized.  Left Ovary  Not visualized.  Right Ovary  Not visualized.  Cul De Sac  No free fluid seen.  Adnexa  No abnormality visualized. ---------------------------------------------------------------------- Impression  Dichorionic-diamniotic twin pregnancy.  Twin A: Maternal left, cephalic, anterior placenta. Amniotic  fluid is normal and good fetal activity is seen. Fetal growth is  appropriate for gestational age (EFW=32nd percentile).  Nasal bone is absent.  Twin B: Maternal right, variable presentation, anterior  placenta. Amniotic fluid is increased (deepest vertical  pocket=11 cm). Fetal growth is appropriate for gestational  age (EFW=51st percentile).  Growth discordancy: 10% (normal).  I reassured the patient of the findings. Explained  polyhydramnios in twin B, but no clear identifiable cause is  seen. She was counseled that in twin pregnancies, the  chances of preterm delivery is higher. ---------------------------------------------------------------------- Recommendations  An appointment was made for her to return in 4 weeks for  fetal growth assessment. ----------------------------------------------------------------------                  Noralee Space, MD Electronically Signed Final Report   06/07/2018 04:49 pm ----------------------------------------------------------------------  [place] Mfm Ob Follow Up Addl Gest  Result Date: 06/07/2018 ----------------------------------------------------------------------  OBSTETRICS REPORT                       (Signed Final 06/07/2018 04:49 pm) ---------------------------------------------------------------------- Patient Info  ID #:       [id number]                           D.O.B.:  1995/08/10 (23 yrs)  Name:       Sarah Schmitt [facility]            Visit Date: 06/07/2018 01:31 pm ---------------------------------------------------------------------- Performed By  Performed By:     Eden Lathe BS      Ref. Address:     [address]                    [address]  [address]  Attending:        Noralee Space MD        [facility]  Referred By:      [facility] ---------------------------------------------------------------------- Orders   #  Description                          Code         Ordered By   1  [place] MFM OB FOLLOW UP                  76816.01     RAVI SHANKAR   2  [place] MFM OB FOLLOW UP ADDL             [id number]     RAVI SHANKAR      GEST  ----------------------------------------------------------------------   #  Order #                    Accession #                 Episode #   1  [id number]                  [id number]                  [id number]   2  [id number]                  [id number]                  [id number]  ---------------------------------------------------------------------- Indications   Encounter for other antenatal screening        Z36.2   follow-up  (low risk panorama, AFP negative)   Twin pregnancy, di/di, third trimester         O30.043   Late to prenatal care, third trimester         O09.33   Obesity complicating pregnancy, third          O99.213   trimester (pregravid BMI 31)   Previous cesarean delivery, antepartum x 3     O34.219   [redacted] weeks gestation of pregnancy                Z3A.29  ---------------------------------------------------------------------- Fetal Evaluation (Fetus A)  Num Of Fetuses:         2  Fetal Heart Rate(bpm):  145  Cardiac Activity:       Observed  Fetal Lie:              Maternal left side  Presentation:           Cephalic  Placenta:               Anterior  P. Cord Insertion:       Visualized  Membrane Desc:      Dividing Membrane seen - Dichorionic.  Amniotic Fluid  AFI FV:      Within normal limits  Largest Pocket(cm)                              7.7 ---------------------------------------------------------------------- Biometry (Fetus A)  BPD:      74.1  mm     G. Age:  29w 5d         33  %    CI:        73.79   %    70 - 86                                                          FL/HC:      19.2   %    19.2 - 21.4  HC:       274   mm     G. Age:  29w 6d         19  %    HC/AC:      1.10        0.99 - 1.21  AC:      249.3  mm     G. Age:  29w 1d         24  %    FL/BPD:     70.9   %    71 - 87  FL:       52.5  mm     G. Age:  28w 0d          3  %    FL/AC:      21.1   %    20 - 24  HUM:      46.2  mm     G. Age:  27w 2d        < 5  %  Est. FW:    1297  gm    2 lb 14 oz      32  %     FW Discordancy        10  % ---------------------------------------------------------------------- OB History  Gravidity:    4         Term:   3        Prem:   0        SAB:   0  TOP:          0       Ectopic:  0        Living: 3 ---------------------------------------------------------------------- Gestational Age (Fetus A)  LMP:           29w 6d        Date:  11/10/17                 EDD:   08/17/18  U/S Today:     29w 1d                                        EDD:   08/22/18  Best:          29w 6d     Det. By:  LMP  (11/10/17)          EDD:   08/17/18 ---------------------------------------------------------------------- Anatomy (Fetus A)  Cranium:  Appears normal         Aortic Arch:            Previously seen  Cavum:                 Appears normal         Ductal Arch:            Previously seen  Ventricles:            Appears normal         Diaphragm:              Previously seen  Choroid Plexus:        Previously seen        Stomach:                Appears normal, left                                                                        sided  Cerebellum:             Previously seen        Abdomen:                Appears normal  Posterior Fossa:       Previously seen        Abdominal Wall:         Appears nml (cord                                                                        insert, abd wall)  Nuchal Fold:           Not applicable (>20    Cord Vessels:           Appears normal ([redacted]                         wks GA)                                        vessel cord)  Face:                  Absent nasal bone      Kidneys:                Appear normal  Lips:                  Appears normal         Bladder:                Appears normal  Thoracic:              Appears normal         Spine:                  Previously  seen  Heart:                 Appears normal         Upper Extremities:      Previously seen                         (4CH, axis, and                         situs)  RVOT:                  Appears normal         Lower Extremities:      Previously seen  LVOT:                  Appears normal  Other:  Heels and 5th digit previously visualized. ---------------------------------------------------------------------- Fetal Evaluation (Fetus B)  Num Of Fetuses:         2  Fetal Heart Rate(bpm):  151  Cardiac Activity:       Observed  Fetal Lie:              Maternal right side  Presentation:           Variable  Placenta:               Anterior  P. Cord Insertion:      Visualized  Membrane Desc:      Dividing Membrane seen - Dichorionic.  Amniotic Fluid  AFI FV:      Polyhydramnios                              Largest Pocket(cm)                              11.16 ---------------------------------------------------------------------- Biometry (Fetus B)  BPD:      76.6  mm     G. Age:  30w 5d         66  %    CI:        72.26   %    70 - 86                                                          FL/HC:      19.1   %    19.2 - 21.4  HC:      286.7  mm     G. Age:  31w 3d         63  %    HC/AC:      1.12        0.99 - 1.21  AC:      255.9  mm     G. Age:  29w 6d          42  %    FL/BPD:     71.5   %    71 - 87  FL:       54.8  mm     G. Age:  28w 6d         14  %    FL/AC:  21.4   %    20 - 24  HUM:      50.1  mm     G. Age:  29w 3d         41  %  Est. FW:    1445  gm      3 lb 3 oz     51  %     FW Discordancy     0 \ 10 % ---------------------------------------------------------------------- Gestational Age (Fetus B)  LMP:           29w 6d        Date:  11/10/17                 EDD:   08/17/18  U/S Today:     30w 2d                                        EDD:   08/14/18  Best:          29w 6d     Det. By:  LMP  (11/10/17)          EDD:   08/17/18 ---------------------------------------------------------------------- Anatomy (Fetus B)  Cranium:               Appears normal         Aortic Arch:            Previously seen  Cavum:                 Appears normal         Ductal Arch:            Previously seen  Ventricles:            Previously seen        Diaphragm:              Previously seen  Choroid Plexus:        Previously seen        Stomach:                Appears normal, left                                                                        sided  Cerebellum:            Previously seen        Abdomen:                Appears normal  Posterior Fossa:       Previously seen        Abdominal Wall:         Appears nml (cord                                                                        insert, abd wall)  Nuchal Fold:  Not applicable (>20    Cord Vessels:           Previously seen                         wks GA)  Face:                  Appears normal         Kidneys:                Previously seen                         (orbits and profile)  Lips:                  Appears normal         Bladder:                Appears normal  Thoracic:              Appears normal         Spine:                  Previously seen  Heart:                 Appears normal         Upper Extremities:      Previously seen                         (4CH, axis, and                          situs)  RVOT:                  Appears normal         Lower Extremities:      Previously seen  LVOT:                  Appears normal  Other:  Heels previously visualized. Nasal bone and 5th digit previously          visualized. ---------------------------------------------------------------------- Cervix Uterus Adnexa  Cervix  Not visualized (advanced GA >24wks)  Uterus  No abnormality visualized.  Left Ovary  Not visualized.  Right Ovary  Not visualized.  Cul De Sac  No free fluid seen.  Adnexa  No abnormality visualized. ---------------------------------------------------------------------- Impression  Dichorionic-diamniotic twin pregnancy.  Twin A: Maternal left, cephalic, anterior placenta. Amniotic  fluid is normal and good fetal activity is seen. Fetal growth is  appropriate for gestational age (EFW=32nd percentile).  Nasal bone is absent.  Twin B: Maternal right, variable presentation, anterior  placenta. Amniotic fluid is increased (deepest vertical  pocket=11 cm). Fetal growth is appropriate for gestational  age (EFW=51st percentile).  Growth discordancy: 10% (normal).  I reassured the patient of the findings. Explained  polyhydramnios in twin B, but no clear identifiable cause is  seen. She was counseled that in twin pregnancies, the  chances of preterm delivery is higher. ---------------------------------------------------------------------- Recommendations  An appointment was made for her to return in 4 weeks for  fetal growth assessment. ----------------------------------------------------------------------                  Noralee Space, MD Electronically Signed Final Report   06/07/2018 04:49 pm ----------------------------------------------------------------------   Assessment and Plan:  Pregnancy: [id number] at [redacted]w[redacted]d 1.  Dichorionic diamniotic twin pregnancy in third trimester Concordant, normal growth. Continue serial scans as per MFM. Delivery indicated at 38 weeks, order sent for  scheduled  2. Supervision of high risk pregnancy in third trimester Refuses to come in early to do 2 hr GTT, wants 1 hr GTT. Understands that she will need 2 or 3 hr GTT for abnormal 1 hr GTT.  Will come on 06/19/2018 PM for her third trimester labs. Declines Tdap vaccine. - CBC; Future - RPR; Future - HIV Antibody (routine testing w rflx); Future - Glucose tolerance, 1 hour; Future - Comprehensive metabolic panel; Future Preterm labor symptoms and general obstetric precautions including but not limited to vaginal bleeding, contractions, leaking of fluid and fetal movement were reviewed in detail with the patient. Please refer to After Visit Summary for other counseling recommendations.   Return in about 2 weeks (around 07/01/2018) for Virtual OB Visit. Lab Visit on 06/19/2018.  Future Appointments  Date Time Provider Department Center  06/19/2018  2:00 PM [facility] LAB [facility] None  07/01/2018  4:00 PM Conan Bowens, MD [facility] None  07/04/2018  2:00 PM [facility] NURSE [facility]  07/04/2018  2:00 PM [facility] [place] 3 [facility]    Jaynie Collins, MD

## 2018-06-19 ENCOUNTER — Other Ambulatory Visit: Payer: Medicaid Other

## 2018-06-20 ENCOUNTER — Telehealth: Payer: Self-pay | Admitting: Obstetrics and Gynecology

## 2018-07-01 ENCOUNTER — Ambulatory Visit (INDEPENDENT_AMBULATORY_CARE_PROVIDER_SITE_OTHER): Payer: Medicaid Other | Admitting: Obstetrics and Gynecology

## 2018-07-01 ENCOUNTER — Encounter (HOSPITAL_COMMUNITY): Payer: Self-pay

## 2018-07-01 ENCOUNTER — Other Ambulatory Visit: Payer: Self-pay

## 2018-07-01 ENCOUNTER — Inpatient Hospital Stay (HOSPITAL_BASED_OUTPATIENT_CLINIC_OR_DEPARTMENT_OTHER): Payer: Medicaid Other

## 2018-07-01 ENCOUNTER — Inpatient Hospital Stay (HOSPITAL_COMMUNITY)
Admission: AD | Admit: 2018-07-01 | Discharge: 2018-07-01 | Discharge status: Against Medical Advice | Payer: Medicaid Other | Attending: Obstetrics and Gynecology | Admitting: Obstetrics and Gynecology

## 2018-07-01 ENCOUNTER — Encounter: Payer: Self-pay | Admitting: Obstetrics and Gynecology

## 2018-07-01 DIAGNOSIS — Z3A33 33 weeks gestation of pregnancy: Secondary | ICD-10-CM

## 2018-07-01 DIAGNOSIS — R8271 Bacteriuria: Secondary | ICD-10-CM | POA: Diagnosis not present

## 2018-07-01 DIAGNOSIS — O0993 Supervision of high risk pregnancy, unspecified, third trimester: Secondary | ICD-10-CM

## 2018-07-01 DIAGNOSIS — O99213 Obesity complicating pregnancy, third trimester: Secondary | ICD-10-CM

## 2018-07-01 DIAGNOSIS — O30043 Twin pregnancy, dichorionic/diamniotic, third trimester: Secondary | ICD-10-CM | POA: Diagnosis not present

## 2018-07-01 DIAGNOSIS — O289 Unspecified abnormal findings on antenatal screening of mother: Secondary | ICD-10-CM

## 2018-07-01 DIAGNOSIS — O0933 Supervision of pregnancy with insufficient antenatal care, third trimester: Secondary | ICD-10-CM

## 2018-07-01 DIAGNOSIS — R52 Pain, unspecified: Secondary | ICD-10-CM | POA: Diagnosis not present

## 2018-07-01 DIAGNOSIS — O368130 Decreased fetal movements, third trimester, not applicable or unspecified: Secondary | ICD-10-CM

## 2018-07-01 DIAGNOSIS — O0930 Supervision of pregnancy with insufficient antenatal care, unspecified trimester: Secondary | ICD-10-CM

## 2018-07-01 DIAGNOSIS — O34219 Maternal care for unspecified type scar from previous cesarean delivery: Secondary | ICD-10-CM | POA: Diagnosis not present

## 2018-07-01 DIAGNOSIS — O26893 Other specified pregnancy related conditions, third trimester: Secondary | ICD-10-CM

## 2018-07-01 DIAGNOSIS — Z87891 Personal history of nicotine dependence: Secondary | ICD-10-CM | POA: Insufficient documentation

## 2018-07-01 DIAGNOSIS — O9989 Other specified diseases and conditions complicating pregnancy, childbirth and the puerperium: Secondary | ICD-10-CM

## 2018-07-01 DIAGNOSIS — O288 Other abnormal findings on antenatal screening of mother: Secondary | ICD-10-CM

## 2018-07-01 LAB — COMPREHENSIVE METABOLIC PANEL
ALT: 12 U/L (ref 0–44)
AST: 19 U/L (ref 15–41)
Albumin: 2.9 g/dL — ABNORMAL LOW (ref 3.5–5.0)
Alkaline Phosphatase: 156 U/L — ABNORMAL HIGH (ref 38–126)
Anion gap: 9 (ref 5–15)
BUN: <5 mg/dL — ABNORMAL LOW (ref 6–20)
CO2: 22 mmol/L (ref 22–32)
Calcium: 9 mg/dL (ref 8.9–10.3)
Chloride: 106 mmol/L (ref 98–111)
Creatinine, Ser: 0.62 mg/dL (ref 0.44–1.00)
GFR calc Af Amer: >60 mL/min (ref >60)
GFR calc non Af Amer: >60 mL/min (ref >60)
Glucose, Bld: 79 mg/dL (ref 70–99)
Potassium: 3.8 mmol/L (ref 3.5–5.1)
Sodium: 137 mmol/L (ref 135–145)
Total Bilirubin: 0.7 mg/dL (ref 0.3–1.2)
Total Protein: 6.4 g/dL — ABNORMAL LOW (ref 6.5–8.1)

## 2018-07-01 LAB — CBC
HCT: 32.1 % — ABNORMAL LOW (ref 36.0–46.0)
Hemoglobin: 10.4 g/dL — ABNORMAL LOW (ref 12.0–15.0)
MCH: 28.1 pg (ref 26.0–34.0)
MCHC: 32.4 g/dL (ref 30.0–36.0)
MCV: 86.8 fL (ref 80.0–100.0)
Platelets: 306 10*3/uL (ref 150–400)
RBC: 3.7 MIL/uL — ABNORMAL LOW (ref 3.87–5.11)
RDW: 13.9 % (ref 11.5–15.5)
WBC: 7.9 10*3/uL (ref 4.0–10.5)
nRBC: 0 % (ref 0.0–0.2)

## 2018-07-01 LAB — HEMOGLOBIN A1C
Hgb A1c MFr Bld: 5.3 % (ref 4.8–5.6)
Mean Plasma Glucose: 105.41 mg/dL

## 2018-07-01 NOTE — Progress Notes (Signed)
Pt states she is having ctx, couple per hour.  Pt states worse last night. Pt does not want to go to hospital as she feels she sits there for nothing.   Pt states some changes in movement, not feeling as much. Pt has increase in back pain.

## 2018-07-01 NOTE — MAU Note (Signed)
Pt advised not to leave due to waiting for [place] results, she had to leave against medical advice to care for other children. Signed AMA form.

## 2018-07-01 NOTE — MAU Note (Signed)
Was having lots of contractions and pressure through the night, didn't stop until noon.  Had a phone visit today and they recommended she come in .

## 2018-07-01 NOTE — Progress Notes (Signed)
TELEHEALTH OBSTETRICS PRENATAL TELEPHONE VISIT ENCOUNTER NOTE  Provider location: [facility] [place]   I connected with Sarah Schmitt on 07/01/18 at  4:00 PM EDT by Telephone Encounter at home and verified that I am speaking with the correct person using two identifiers.  Patient does not have app for video visit downloaded.   I discussed the limitations, risks, security and privacy concerns of performing an evaluation and management service by telephone and the availability of in person appointments. I also discussed with the patient that there may be a patient responsible charge related to this service. The patient expressed understanding and agreed to proceed. Subjective:  Sarah Schmitt is a 23 y.o. [phone] at [redacted]w[redacted]d being seen today for ongoing prenatal care.  She is currently monitored for the following issues for this high-risk pregnancy and has History of cesarean delivery x 3 affecting pregnancy; Late prenatal care; Anemia in pregnancy, second trimester; Supervision of high-risk pregnancy; Twin gestation, dichorionic diamniotic; and Group B streptococcal bacteriuria on their problem list.  Patient reports vaginal pressure and contractions last night. Stopped around 12 pm today. Reports she has felt one movement in one twin today, overall much less movement than usual for her. Contractions: Irregular. Vag. Bleeding: None.  Movement: Present. Denies any leaking of fluid.   The following portions of the patient's history were reviewed and updated as appropriate: allergies, current medications, past family history, past medical history, past social history, past surgical history and problem list.   Objective:  There were no vitals filed for this visit.  Fetal Status:     Movement: Present     General:  Alert, oriented and cooperative. Patient is in no acute distress.  Respiratory: Normal respiratory effort, no problems with respiration noted  Mental  Status: Normal mood and affect. Normal behavior. Normal judgment and thought content.  Rest of physical exam deferred due to type of encounter  Assessment and Plan:  Pregnancy: [id number] at [redacted]w[redacted]d  1. Supervision of high risk pregnancy in third trimester - liletta IUD with CS - Patient has not come in for 3rd trim labs yet, reviewed importance of coming for lab appointment  2. Group B streptococcal bacteriuria  3. Late prenatal care  4. History of cesarean delivery x 3 affecting pregnancy For repeat c-section  5. Dichorionic diamniotic twin pregnancy in third trimester Poly in Twin B 10% discordance F/u growth 07/04/18  Patient reports significantly decreased movement today in both twins, regular contractions last night that stopped around 12 pm today. To [facility]U for eval, reviewed with [facility] provider.    Preterm labor symptoms and general obstetric precautions including but not limited to vaginal bleeding, contractions, leaking of fluid and fetal movement were reviewed in detail with the patient. I discussed the assessment and treatment plan with the patient. The patient was provided an opportunity to ask questions and all were answered. The patient agreed with the plan and demonstrated an understanding of the instructions. The patient was advised to call back or seek an in-person office evaluation/go to [facility]U at [facility] for any urgent or concerning symptoms. Please refer to After Visit Summary for other counseling recommendations.   I provided 18 minutes of face-to-face time during this encounter.  Return in about 2 weeks (around 07/15/2018) for OB visit (MD), virtual.  Future Appointments  Date Time Provider Department Center  07/04/2018  2:00 PM [facility] NURSE [facility]  07/04/2018  2:00 PM [facility] [place] 3 [facility]  Sarah Leiter, MD [facility] [place], [place]

## 2018-07-01 NOTE — MAU Provider Note (Signed)
History     CSN: [id number]  Arrival date and time: 07/01/18 1644   None     Chief Complaint  Patient presents with  . Contractions   [name] is a 23 y.o. [phone] at [redacted]w[redacted]d who presents to MAU for feeling decreased fetal movement beginning last night. Pt was sent from the clinic by Dr. Earlene Plater after discussing DFM during her virtual visit.  Last food/drink: 1400 Smoker? No, last smoke marijuana 44mo ago Current medications/supplements: PNVs, Flagyl,  Recent AFI: Baby B - polyhydramnios, Baby A - normal fluid (results from 06/07/2018) Anterior placenta? Yes for both babies Doing FKCs? no Problems this pregnancy include: di-di twins Pt denies prior instances of DFM. Pt denies all risk factors for stillbirth, including, but not limited to: IUGR, placental abruption, infection, genetic/congenital anomalies, fetomaternal hemorrhage, DM, HTN, smoking/drug use, umbilical cord/placental abnormalities, uterine abnormalities, fetal hydrops, arrythmia, platelet dysfunction, IHCP.  Pt reports feeling ctx as an "everyday thing" but reports they were more than usual last night until 1200 this afternoon. Pt reports ctx are now "back to normal."  Pt also reports abnormal vaginal odor and itching, but also reports she was given RX for yeast and BV on 06/09/2018 that she has not yet started, but states she will pick them up today.  Pt denies VB, LOF, vaginal discharge.  Allergies? NKDA Prenatal care provider/next appt? [facility], 07/15/2018   OB History    Gravida  4   Para  3   Term  3   Preterm      AB  0   Living  3     SAB  0   TAB  0   Ectopic      Multiple  0   Live Births  3           Past Medical History:  Diagnosis Date  . Anemia   . Dyspnea   . Hypotension     Past Surgical History:  Procedure Laterality Date  . c sections    . CESAREAN SECTION    . CESAREAN SECTION N/A 08/28/2016   Procedure: CESAREAN SECTION;  Surgeon: Adam Phenix,  MD;  Location: [facility]THING [facility];  Service: Obstetrics;  Laterality: N/A;  . CESAREAN SECTION N/A 10/02/2017   Procedure: REPEAT CESAREAN SECTION;  Surgeon: Catalina Antigua, MD;  Location: [facility] BIRTHING SUITES;  Service: Obstetrics;  Laterality: N/A;    Family History  Problem Relation Age of Onset  . Diabetes Father   . Hypertension Maternal Grandmother   . Diabetes Maternal Grandmother   . Diabetes Paternal Grandmother   . Hypertension Paternal Grandmother   . Diabetes Mother     Social History   Tobacco Use  . Smoking status: Former Smoker    Types: Cigarettes    Last attempt to quit: 12/11/2017    Years since quitting: 0.5  . Smokeless tobacco: Never Used  Substance Use Topics  . Alcohol use: No  . Drug use: No    Allergies: No Known Allergies  Medications Prior to Admission  Medication Sig Dispense Refill Last Dose  . Prenat-FeCbn-FeAspGl-FA-Omega (OB COMPLETE PETITE) 35-5-1-200 MG CAPS Take 1 tablet by mouth daily. 30 capsule 12 07/01/2018 at 1100  . Cetirizine HCl (ZYRTEC PO) Take by mouth.   More than a month at Unknown time  . metroNIDAZOLE (FLAGYL) 500 MG tablet Take 1 tablet (500 mg total) by mouth 2 (two) times daily. 14 tablet 0 Taking  . terconazole (TERAZOL 7) 0.4 %  vaginal cream Place 1 applicator vaginally at bedtime. 45 g 0 Taking    Review of Systems  Constitutional: Negative for chills, diaphoresis, fatigue and fever.  Respiratory: Negative for shortness of breath.   Cardiovascular: Negative for chest pain.  Gastrointestinal: Negative for abdominal pain, constipation, diarrhea, nausea and vomiting.  Genitourinary: Positive for pelvic pain (pt reports that her C/S scar is burning) and vaginal discharge. Negative for dysuria, flank pain, frequency, urgency and vaginal bleeding.  Neurological: Negative for dizziness, weakness, light-headedness and headaches.   Physical Exam   Blood pressure 124/72, pulse 83, temperature 97.7 F (36.5 C), temperature source  Oral, resp. rate 18, weight 86.6 kg, last menstrual period 11/10/2017, unknown if currently breastfeeding.  Patient Vitals for the past 24 hrs:  BP Temp Temp src Pulse Resp Weight  07/01/18 1704 124/72 97.7 F (36.5 C) Oral 83 18 86.6 kg    Physical Exam  Constitutional: She is oriented to person, place, and time. She appears well-developed and well-nourished. No distress.  HENT:  Head: Normocephalic and atraumatic.  Respiratory: Effort normal.  GI: Soft. She exhibits no distension and no mass. There is no abdominal tenderness. There is no rebound and no guarding.  Genitourinary: There is no rash, tenderness or lesion on the right labia. There is no rash, tenderness or lesion on the left labia.    Genitourinary Comments: CE: closed/30/posterior   Neurological: She is alert and oriented to person, place, and time.  Skin: Skin is warm and dry. She is not diaphoretic.  Psychiatric: She has a normal mood and affect. Her behavior is normal. Judgment and thought content normal.   Results for orders placed or performed during the hospital encounter of 07/01/18 (from the past 24 hour(s))  Comprehensive metabolic panel     Status: Abnormal   Collection Time: 07/01/18  5:44 PM  Result Value Ref Range   Sodium 137 135 - 145 mmol/L   Potassium 3.8 3.5 - 5.1 mmol/L   Chloride 106 98 - 111 mmol/L   CO2 22 22 - 32 mmol/L   Glucose, Bld 79 70 - 99 mg/dL   BUN <5 (L) 6 - 20 mg/dL   Creatinine, Ser 1.61 0.44 - 1.00 mg/dL   Calcium 9.0 8.9 - 09.6 mg/dL   Total Protein 6.4 (L) 6.5 - 8.1 g/dL   Albumin 2.9 (L) 3.5 - 5.0 g/dL   AST 19 15 - 41 U/L   ALT 12 0 - 44 U/L   Alkaline Phosphatase 156 (H) 38 - 126 U/L   Total Bilirubin 0.7 0.3 - 1.2 mg/dL   GFR calc non Af Amer >60 >60 mL/min   GFR calc Af Amer >60 >60 mL/min   Anion gap 9 5 - 15  Hemoglobin A1c     Status: None   Collection Time: 07/01/18  5:44 PM  Result Value Ref Range   Hgb A1c MFr Bld 5.3 4.8 - 5.6 %   Mean Plasma Glucose 105.41  mg/dL  CBC     Status: Abnormal   Collection Time: 07/01/18  5:44 PM  Result Value Ref Range   WBC 7.9 4.0 - 10.5 K/uL   RBC 3.70 (L) 3.87 - 5.11 MIL/uL   Hemoglobin 10.4 (L) 12.0 - 15.0 g/dL   HCT 04.5 (L) 40.9 - 81.1 %   MCV 86.8 80.0 - 100.0 fL   MCH 28.1 26.0 - 34.0 pg   MCHC 32.4 30.0 - 36.0 g/dL   RDW 91.4 78.2 - 95.6 %  Platelets 306 150 - 400 K/uL   nRBC 0.0 0.0 - 0.2 %   [place] Mfm Ob Follow Up  Result Date: 06/07/2018 ----------------------------------------------------------------------  OBSTETRICS REPORT                       (Signed Final 06/07/2018 04:49 pm) ---------------------------------------------------------------------- Patient Info  ID #:       [id number]                          D.O.B.:  [birth year] (23 yrs)  Name:       Sarah Schmitt [facility]            Visit Date: 06/07/2018 01:31 pm ---------------------------------------------------------------------- Performed By  Performed By:     Eden Lathe BS      Ref. Address:     [address]                    [address]                                                             [address]  Attending:        Noralee Space MD        [facility]  Referred By:      [facility] ---------------------------------------------------------------------- Orders   #  Description                          Code         Ordered By   1  [place] MFM OB FOLLOW UP                  40981.19     RAVI SHANKAR   2  [place] MFM OB FOLLOW UP ADDL             14782.95     RAVI SHANKAR      GEST  ----------------------------------------------------------------------   #  Order #                    Accession #                 Episode #   1  [id number]                  [id number]                  [id number]   2  [id number]                  [id number]                  [id number]  ---------------------------------------------------------------------- Indications   Encounter for other  antenatal screening        Z36.2  follow-up  (low risk panorama, AFP negative)   Twin pregnancy, di/di, third trimester         O30.043   Late to prenatal care, third trimester         O09.33   Obesity complicating pregnancy, third          O99.213   trimester (pregravid BMI 31)   Previous cesarean delivery, antepartum x 3     O34.219   [redacted] weeks gestation of pregnancy                Z3A.29  ---------------------------------------------------------------------- Fetal Evaluation (Fetus A)  Num Of Fetuses:         2  Fetal Heart Rate(bpm):  145  Cardiac Activity:       Observed  Fetal Lie:              Maternal left side  Presentation:           Cephalic  Placenta:               Anterior  P. Cord Insertion:      Visualized  Membrane Desc:      Dividing Membrane seen - Dichorionic.  Amniotic Fluid  AFI FV:      Within normal limits                              Largest Pocket(cm)                              7.7 ---------------------------------------------------------------------- Biometry (Fetus A)  BPD:      74.1  mm     G. Age:  29w 5d         33  %    CI:        73.79   %    70 - 86                                                          FL/HC:      19.2   %    19.2 - 21.4  HC:       274   mm     G. Age:  29w 6d         19  %    HC/AC:      1.10        0.99 - 1.21  AC:      249.3  mm     G. Age:  29w 1d         24  %    FL/BPD:     70.9   %    71 - 87  FL:       52.5  mm     G. Age:  28w 0d          3  %    FL/AC:      21.1   %    20 - 24  HUM:      46.2  mm     G. Age:  27w 2d        < 5  %  Est. FW:    1297  gm  2 lb 14 oz      32  %     FW Discordancy        10  % ---------------------------------------------------------------------- OB History  Gravidity:    4         Term:   3        Prem:   0        SAB:   0  TOP:          0       Ectopic:  0        Living: 3 ---------------------------------------------------------------------- Gestational Age (Fetus A)  LMP:           29w 6d        Date:  11/10/17                  EDD:   08/17/18  U/S Today:     29w 1d                                        EDD:   08/22/18  Best:          29w 6d     Det. By:  LMP  (11/10/17)          EDD:   08/17/18 ---------------------------------------------------------------------- Anatomy (Fetus A)  Cranium:               Appears normal         Aortic Arch:            Previously seen  Cavum:                 Appears normal         Ductal Arch:            Previously seen  Ventricles:            Appears normal         Diaphragm:              Previously seen  Choroid Plexus:        Previously seen        Stomach:                Appears normal, left                                                                        sided  Cerebellum:            Previously seen        Abdomen:                Appears normal  Posterior Fossa:       Previously seen        Abdominal Wall:         Appears nml (cord  insert, abd wall)  Nuchal Fold:           Not applicable (>20    Cord Vessels:           Appears normal ([redacted]                         wks GA)                                        vessel cord)  Face:                  Absent nasal bone      Kidneys:                Appear normal  Lips:                  Appears normal         Bladder:                Appears normal  Thoracic:              Appears normal         Spine:                  Previously seen  Heart:                 Appears normal         Upper Extremities:      Previously seen                         (4CH, axis, and                         situs)  RVOT:                  Appears normal         Lower Extremities:      Previously seen  LVOT:                  Appears normal  Other:  Heels and 5th digit previously visualized. ---------------------------------------------------------------------- Fetal Evaluation (Fetus B)  Num Of Fetuses:         2  Fetal Heart Rate(bpm):  151  Cardiac Activity:       Observed  Fetal Lie:               Maternal right side  Presentation:           Variable  Placenta:               Anterior  P. Cord Insertion:      Visualized  Membrane Desc:      Dividing Membrane seen - Dichorionic.  Amniotic Fluid  AFI FV:      Polyhydramnios                              Largest Pocket(cm)                              11.16 ---------------------------------------------------------------------- Biometry (Fetus B)  BPD:      76.6  mm     G. Age:  30w  5d         66  %    CI:        72.26   %    70 - 86                                                          FL/HC:      19.1   %    19.2 - 21.4  HC:      286.7  mm     G. Age:  31w 3d         63  %    HC/AC:      1.12        0.99 - 1.21  AC:      255.9  mm     G. Age:  29w 6d         42  %    FL/BPD:     71.5   %    71 - 87  FL:       54.8  mm     G. Age:  28w 6d         14  %    FL/AC:      21.4   %    20 - 24  HUM:      50.1  mm     G. Age:  29w 3d         41  %  Est. FW:    1445  gm      3 lb 3 oz     51  %     FW Discordancy     0 \ 10 % ---------------------------------------------------------------------- Gestational Age (Fetus B)  LMP:           29w 6d        Date:  11/10/17                 EDD:   08/17/18  U/S Today:     30w 2d                                        EDD:   08/14/18  Best:          29w 6d     Det. By:  LMP  (11/10/17)          EDD:   08/17/18 ---------------------------------------------------------------------- Anatomy (Fetus B)  Cranium:               Appears normal         Aortic Arch:            Previously seen  Cavum:                 Appears normal         Ductal Arch:            Previously seen  Ventricles:            Previously seen        Diaphragm:              Previously seen  Choroid Plexus:        Previously seen  Stomach:                Appears normal, left                                                                        sided  Cerebellum:            Previously seen        Abdomen:                Appears normal  Posterior Fossa:        Previously seen        Abdominal Wall:         Appears nml (cord                                                                        insert, abd wall)  Nuchal Fold:           Not applicable (>20    Cord Vessels:           Previously seen                         wks GA)  Face:                  Appears normal         Kidneys:                Previously seen                         (orbits and profile)  Lips:                  Appears normal         Bladder:                Appears normal  Thoracic:              Appears normal         Spine:                  Previously seen  Heart:                 Appears normal         Upper Extremities:      Previously seen                         (4CH, axis, and                         situs)  RVOT:                  Appears normal         Lower Extremities:      Previously seen  LVOT:  Appears normal  Other:  Heels previously visualized. Nasal bone and 5th digit previously          visualized. ---------------------------------------------------------------------- Cervix Uterus Adnexa  Cervix  Not visualized (advanced GA >24wks)  Uterus  No abnormality visualized.  Left Ovary  Not visualized.  Right Ovary  Not visualized.  Cul De Sac  No free fluid seen.  Adnexa  No abnormality visualized. ---------------------------------------------------------------------- Impression  Dichorionic-diamniotic twin pregnancy.  Twin A: Maternal left, cephalic, anterior placenta. Amniotic  fluid is normal and good fetal activity is seen. Fetal growth is  appropriate for gestational age (EFW=32nd percentile).  Nasal bone is absent.  Twin B: Maternal right, variable presentation, anterior  placenta. Amniotic fluid is increased (deepest vertical  pocket=11 cm). Fetal growth is appropriate for gestational  age (EFW=51st percentile).  Growth discordancy: 10% (normal).  I reassured the patient of the findings. Explained  polyhydramnios in twin B, but no clear identifiable cause is  seen. She  was counseled that in twin pregnancies, the  chances of preterm delivery is higher. ---------------------------------------------------------------------- Recommendations  An appointment was made for her to return in 4 weeks for  fetal growth assessment. ----------------------------------------------------------------------                  Noralee Space, MD Electronically Signed Final Report   06/07/2018 04:49 pm ----------------------------------------------------------------------  [place] Mfm Ob Follow Up Addl Gest  Result Date: 06/07/2018 ----------------------------------------------------------------------  OBSTETRICS REPORT                       (Signed Final 06/07/2018 04:49 pm) ---------------------------------------------------------------------- Patient Info  ID #:       [id number]                          D.O.B.:  08-30-1995 (23 yrs)  Name:       Sarah Schmitt [facility]            Visit Date: 06/07/2018 01:31 pm ---------------------------------------------------------------------- Performed By  Performed By:     Eden Lathe BS      Ref. Address:     [address]                    [address]                                                             [address]  Attending:        Noralee Space MD        [facility]  Referred By:      [facility] ---------------------------------------------------------------------- Orders   #  Description                          Code         Ordered  By   1  [place] MFM OB FOLLOW UP                  76816.01     RAVI SHANKAR   2  [place] MFM OB FOLLOW UP ADDL             [id number]     RAVI SHANKAR      GEST  ----------------------------------------------------------------------   #  Order #                    Accession #                 Episode #   1  [id number]                  [id number]                  [id number]   2  [id number]                  [id number]                  [id number]   ---------------------------------------------------------------------- Indications   Encounter for other antenatal screening        Z36.2   follow-up  (low risk panorama, AFP negative)   Twin pregnancy, di/di, third trimester         O30.043   Late to prenatal care, third trimester         O09.33   Obesity complicating pregnancy, third          O99.213   trimester (pregravid BMI 31)   Previous cesarean delivery, antepartum x 3     O34.219   [redacted] weeks gestation of pregnancy                Z3A.29  ---------------------------------------------------------------------- Fetal Evaluation (Fetus A)  Num Of Fetuses:         2  Fetal Heart Rate(bpm):  145  Cardiac Activity:       Observed  Fetal Lie:              Maternal left side  Presentation:           Cephalic  Placenta:               Anterior  P. Cord Insertion:      Visualized  Membrane Desc:      Dividing Membrane seen - Dichorionic.  Amniotic Fluid  AFI FV:      Within normal limits                              Largest Pocket(cm)                              7.7 ---------------------------------------------------------------------- Biometry (Fetus A)  BPD:      74.1  mm     G. Age:  29w 5d         33  %    CI:        73.79   %    70 - 86  FL/HC:      19.2   %    19.2 - 21.4  HC:       274   mm     G. Age:  29w 6d         19  %    HC/AC:      1.10        0.99 - 1.21  AC:      249.3  mm     G. Age:  29w 1d         24  %    FL/BPD:     70.9   %    71 - 87  FL:       52.5  mm     G. Age:  28w 0d          3  %    FL/AC:      21.1   %    20 - 24  HUM:      46.2  mm     G. Age:  27w 2d        < 5  %  Est. FW:    1297  gm    2 lb 14 oz      32  %     FW Discordancy        10  % ---------------------------------------------------------------------- OB History  Gravidity:    4         Term:   3        Prem:   0        SAB:   0  TOP:          0       Ectopic:  0        Living: 3  ---------------------------------------------------------------------- Gestational Age (Fetus A)  LMP:           29w 6d        Date:  11/10/17                 EDD:   08/17/18  U/S Today:     29w 1d                                        EDD:   08/22/18  Best:          29w 6d     Det. By:  LMP  (11/10/17)          EDD:   08/17/18 ---------------------------------------------------------------------- Anatomy (Fetus A)  Cranium:               Appears normal         Aortic Arch:            Previously seen  Cavum:                 Appears normal         Ductal Arch:            Previously seen  Ventricles:            Appears normal         Diaphragm:              Previously seen  Choroid Plexus:        Previously seen        Stomach:  Appears normal, left                                                                        sided  Cerebellum:            Previously seen        Abdomen:                Appears normal  Posterior Fossa:       Previously seen        Abdominal Wall:         Appears nml (cord                                                                        insert, abd wall)  Nuchal Fold:           Not applicable (>20    Cord Vessels:           Appears normal ([redacted]                         wks GA)                                        vessel cord)  Face:                  Absent nasal bone      Kidneys:                Appear normal  Lips:                  Appears normal         Bladder:                Appears normal  Thoracic:              Appears normal         Spine:                  Previously seen  Heart:                 Appears normal         Upper Extremities:      Previously seen                         (4CH, axis, and                         situs)  RVOT:                  Appears normal         Lower Extremities:      Previously seen  LVOT:  Appears normal  Other:  Heels and 5th digit previously visualized.  ---------------------------------------------------------------------- Fetal Evaluation (Fetus B)  Num Of Fetuses:         2  Fetal Heart Rate(bpm):  151  Cardiac Activity:       Observed  Fetal Lie:              Maternal right side  Presentation:           Variable  Placenta:               Anterior  P. Cord Insertion:      Visualized  Membrane Desc:      Dividing Membrane seen - Dichorionic.  Amniotic Fluid  AFI FV:      Polyhydramnios                              Largest Pocket(cm)                              11.16 ---------------------------------------------------------------------- Biometry (Fetus B)  BPD:      76.6  mm     G. Age:  30w 5d         66  %    CI:        72.26   %    70 - 86                                                          FL/HC:      19.1   %    19.2 - 21.4  HC:      286.7  mm     G. Age:  31w 3d         63  %    HC/AC:      1.12        0.99 - 1.21  AC:      255.9  mm     G. Age:  29w 6d         42  %    FL/BPD:     71.5   %    71 - 87  FL:       54.8  mm     G. Age:  28w 6d         14  %    FL/AC:      21.4   %    20 - 24  HUM:      50.1  mm     G. Age:  29w 3d         41  %  Est. FW:    1445  gm      3 lb 3 oz     51  %     FW Discordancy     0 \ 10 % ---------------------------------------------------------------------- Gestational Age (Fetus B)  LMP:           29w 6d        Date:  11/10/17                 EDD:   08/17/18  U/S Today:     30w 2d  EDD:   08/14/18  Best:          29w 6d     Det. By:  LMP  (11/10/17)          EDD:   08/17/18 ---------------------------------------------------------------------- Anatomy (Fetus B)  Cranium:               Appears normal         Aortic Arch:            Previously seen  Cavum:                 Appears normal         Ductal Arch:            Previously seen  Ventricles:            Previously seen        Diaphragm:              Previously seen  Choroid Plexus:        Previously seen        Stomach:                 Appears normal, left                                                                        sided  Cerebellum:            Previously seen        Abdomen:                Appears normal  Posterior Fossa:       Previously seen        Abdominal Wall:         Appears nml (cord                                                                        insert, abd wall)  Nuchal Fold:           Not applicable (>20    Cord Vessels:           Previously seen                         wks GA)  Face:                  Appears normal         Kidneys:                Previously seen                         (orbits and profile)  Lips:                  Appears normal         Bladder:  Appears normal  Thoracic:              Appears normal         Spine:                  Previously seen  Heart:                 Appears normal         Upper Extremities:      Previously seen                         (4CH, axis, and                         situs)  RVOT:                  Appears normal         Lower Extremities:      Previously seen  LVOT:                  Appears normal  Other:  Heels previously visualized. Nasal bone and 5th digit previously          visualized. ---------------------------------------------------------------------- Cervix Uterus Adnexa  Cervix  Not visualized (advanced GA >24wks)  Uterus  No abnormality visualized.  Left Ovary  Not visualized.  Right Ovary  Not visualized.  Cul De Sac  No free fluid seen.  Adnexa  No abnormality visualized. ---------------------------------------------------------------------- Impression  Dichorionic-diamniotic twin pregnancy.  Twin A: Maternal left, cephalic, anterior placenta. Amniotic  fluid is normal and good fetal activity is seen. Fetal growth is  appropriate for gestational age (EFW=32nd percentile).  Nasal bone is absent.  Twin B: Maternal right, variable presentation, anterior  placenta. Amniotic fluid is increased (deepest vertical  pocket=11 cm). Fetal growth is  appropriate for gestational  age (EFW=51st percentile).  Growth discordancy: 10% (normal).  I reassured the patient of the findings. Explained  polyhydramnios in twin B, but no clear identifiable cause is  seen. She was counseled that in twin pregnancies, the  chances of preterm delivery is higher. ---------------------------------------------------------------------- Recommendations  An appointment was made for her to return in 4 weeks for  fetal growth assessment. ----------------------------------------------------------------------                  Noralee Space, MD Electronically Signed Final Report   06/07/2018 04:49 pm ----------------------------------------------------------------------  MAU Course  Procedures  MDM -DFM with di-di twins -NST non-reactive on both babies -UA: pending at time of discharge -EFM, Twin A: non-reactive, but reassuring       -baseline: 135       -variability: moderate       -accels: no accels       -decels: no decels       -TOCO: irregular, but frequent ctx -EFM, Twin B: non-reactive, but reassuring       -baseline: 130       -variability: moderate       -accels: single 15x15       -decels: absent       -TOCO: irregular, but frequent ctx -after a cold drink, NSTs continue to not be reactive and pt reports she does not feel a return of normal fetal movement -BPP: 6/8 for both babies, -2 for breathing for both babies -pt reports prior to exam that she feels as though her C/S scar is burning - requested US examine  lower uterine segment for dehiscence, [place] report shows no uterine abnormalities -CE: closed/30/posterior -per Dr. Earlene Plater, requested 28wk labs be drawn in MAU, A1C to be done in place of 2hr GTT       -CBC: WNL for pregnancy       -CMP: no abnormalities requiring treatment       -A1C: 5.3%       -RPR/HIV - pending -when provider entered the room after pt [place] was completed for a cervical recheck, pt states she is having issues with childcare and she  needs to leave immediately; pt advised she would be leaving AMA and we cannot guarantee the health and wellbeing of her or the babies; pt states she understands, but she does not have anyone to watch her children and must leave, no cervical recheck performed -called and spoke with Dr. Debroah Loop @2020  re: BPP 6/8 and non-reactive NST. Per Dr. Debroah Loop, pt to have NST tomorrow at [facility] - message sent to admin pool, [facility] and Dr. Earlene Plater with high importance, pt notified of plan by telephone after leaving MAU, pt agrees with plan and will call clinic at noon tomorrow if she does not hear from them prior to that time. -pt left AMA  Orders Placed This Encounter  Procedures  . [place] MFM FETAL BPP WO NON STRESS    PLEASE ALSO EXAMINE LOWER UTERINE SEGMENT FOR DEHISCENCE. Please order limited US if necessary. Pt is reporting burning internally along her C/S scar.    Standing Status:   Standing    Number of Occurrences:   1    Order Specific Question:   Symptom/Reason for Exam    Answer:   Non-reactive NST (non-stress test) [782956]  . [place] MFM FETAL BPP WO NST ADDL GESTATION    PLEASE ALSO EXAMINE LOWER UTERINE SEGMENT FOR DEHISCENCE. Please order limited US if necessary. Pt is reporting burning internally along her C/S scar.    Standing Status:   Standing    Number of Occurrences:   1    Order Specific Question:   Symptom/Reason for Exam    Answer:   Non-reactive NST (non-stress test) [213086]  . [place] MFM OB LIMITED    PLEASE ALSO EXAMINE LOWER UTERINE SEGMENT FOR DEHISCENCE. Please order limited US if necessary. Pt is reporting burning internally along her C/S scar.    Standing Status:   Standing    Number of Occurrences:   1    Order Specific Question:   Symptom/Reason for Exam    Answer:   Non-reactive NST (non-stress test) [578469]  . Comprehensive metabolic panel    Standing Status:   Standing    Number of Occurrences:   1  . Hemoglobin A1c    Standing Status:   Standing    Number of  Occurrences:   1  . HIV Antibody (routine testing w rflx)    Standing Status:   Standing    Number of Occurrences:   1  . RPR    Standing Status:   Standing    Number of Occurrences:   1  . CBC    Standing Status:   Standing    Number of Occurrences:   1  . Urinalysis, Routine w reflex microscopic    Standing Status:   Standing    Number of Occurrences:   1   No orders of the defined types were placed in this encounter.  Assessment and Plan   1. Decreased fetal movements in third trimester, single or unspecified  fetus   2. Supervision of high risk pregnancy in third trimester   3. Non-reactive NST (non-stress test)   4. Pain   5. [redacted] weeks gestation of pregnancy   6. Dichorionic diamniotic twin pregnancy in third trimester    -discussed impt of starting medications for BV and yeast and link between BV and PTL/PTD -discussed contribution of anterior placenta and polyhydramnios to feelings of FM -discussed that mothers do not perceive 100% of fetal movement, there is wide variation of mother perception of fetal movement -discussed that fetal movement varies somewhat depending on the time of day and gestational age and there is a wide variety of normal movement among healthy babies -discussed that frequency usually increases from morning to night, with peak activity late at night -discussed that fetal quiet cycles become longer with advancing gestation -discussed normal fetal movement across gestation and normal sleep/wake cycles -pt advised to contact your HCP immediately if you notice a decrease in fetal movement compared to what is normal -you can either do fetal kick counting, or just be aware of what is normal for your baby in terms of movement; no one method is better than the other -pt left AMA   Joni Reining E Barbara Keng 07/01/2018, 8:29 PM

## 2018-07-02 LAB — HIV ANTIBODY (ROUTINE TESTING W REFLEX): HIV Screen 4th Generation wRfx: NONREACTIVE

## 2018-07-02 LAB — RPR: RPR Ser Ql: NONREACTIVE

## 2018-07-03 ENCOUNTER — Inpatient Hospital Stay (HOSPITAL_COMMUNITY)
Admission: AD | Admit: 2018-07-03 | Discharge: 2018-07-03 | Disposition: A | Discharge status: Home / Self Care | Payer: Medicaid Other | Source: Ambulatory Visit | Attending: Obstetrics & Gynecology | Admitting: Obstetrics & Gynecology

## 2018-07-03 ENCOUNTER — Other Ambulatory Visit: Payer: Self-pay

## 2018-07-03 ENCOUNTER — Ambulatory Visit (INDEPENDENT_AMBULATORY_CARE_PROVIDER_SITE_OTHER): Payer: Medicaid Other | Admitting: Obstetrics

## 2018-07-03 DIAGNOSIS — O34219 Maternal care for unspecified type scar from previous cesarean delivery: Secondary | ICD-10-CM | POA: Insufficient documentation

## 2018-07-03 DIAGNOSIS — Z87891 Personal history of nicotine dependence: Secondary | ICD-10-CM | POA: Insufficient documentation

## 2018-07-03 DIAGNOSIS — O26893 Other specified pregnancy related conditions, third trimester: Secondary | ICD-10-CM | POA: Diagnosis present

## 2018-07-03 DIAGNOSIS — O30043 Twin pregnancy, dichorionic/diamniotic, third trimester: Secondary | ICD-10-CM | POA: Insufficient documentation

## 2018-07-03 DIAGNOSIS — O403XX2 Polyhydramnios, third trimester, fetus 2: Secondary | ICD-10-CM | POA: Diagnosis not present

## 2018-07-03 DIAGNOSIS — Z3689 Encounter for other specified antenatal screening: Secondary | ICD-10-CM | POA: Diagnosis not present

## 2018-07-03 DIAGNOSIS — O23593 Infection of other part of genital tract in pregnancy, third trimester: Secondary | ICD-10-CM | POA: Insufficient documentation

## 2018-07-03 DIAGNOSIS — N760 Acute vaginitis: Secondary | ICD-10-CM | POA: Diagnosis not present

## 2018-07-03 DIAGNOSIS — B9689 Other specified bacterial agents as the cause of diseases classified elsewhere: Secondary | ICD-10-CM | POA: Diagnosis not present

## 2018-07-03 DIAGNOSIS — Z3A33 33 weeks gestation of pregnancy: Secondary | ICD-10-CM | POA: Diagnosis not present

## 2018-07-03 DIAGNOSIS — O368130 Decreased fetal movements, third trimester, not applicable or unspecified: Secondary | ICD-10-CM | POA: Diagnosis not present

## 2018-07-03 MED ORDER — CYCLOBENZAPRINE HCL 10 MG PO TABS: 10.0000 mg | ORAL_TABLET | Freq: Three times a day (TID) | ORAL | 1 refills | Status: DC | PRN

## 2018-07-03 MED ORDER — DOCUSATE SODIUM 100 MG PO CAPS: 100.0000 mg | ORAL_CAPSULE | Freq: Two times a day (BID) | ORAL | 0 refills | Status: DC

## 2018-07-03 MED ORDER — METRONIDAZOLE 500 MG PO TABS: 500.0000 mg | ORAL_TABLET | Freq: Two times a day (BID) | ORAL | 0 refills | Status: DC

## 2018-07-03 MED ORDER — TERCONAZOLE 0.4 % VA CREA: 1.0000 | TOPICAL_CREAM | Freq: Every day | VAGINAL | 0 refills | Status: DC

## 2018-07-03 MED ORDER — FERROUS FUMARATE 325 (106 FE) MG PO TABS: 1.0000 | ORAL_TABLET | Freq: Every day | ORAL | 0 refills | Status: DC

## 2018-07-03 NOTE — Progress Notes (Addendum)
Presented for NST-Twins Gestation. NST reviewed by Dr. Clearance Coots it is Non-Reactive. Patient sent to the [facility] by Dr. Clearance Coots.  I have reviewed the chart and agree with nursing staff's documentation of this patient's encounter.  Coral Ceo, MD 07/03/2018 5:00 PM

## 2018-07-03 NOTE — MAU Note (Signed)
Pt states that she was at the doctors visit and the doctor did not like what he saw.   Pt states she is unsure why she is really here and would like more information as to why she was sent here.   Denies vaginal bleeding reports a gushing of fluid feeling when she coughs, but unsure if her water or not because she says she does not know what that would feel like.

## 2018-07-03 NOTE — MAU Provider Note (Signed)
History   CSN: [id number]  Arrival date and time: 07/03/18 1755   First Provider Initiated Contact with Patient 07/03/18 1853      Chief Complaint  Patient presents with  . Non-stress Test   HPI Patient Sarah Schmitt is a 23 y.o. [phone] At 6738w4d here after a non reactive NST in [facility] this afternoon. She is currently pregnant with Di-Di twins; she has a repeat C-section on 08-03-2018. Recently diagnosed with mild polyhyramnios in Twin B; has been in antenatal testing per protocol.  [facility]   She denies bleeding, LOF, decreased fetal movements (patient states that she frequently feels like babies have smaller movements due to their increasing gestation and lack of space in uterus). She denies  dysuria, vaginal discharge,HA, NV, fever, SOB.  Reports feeling daily contractions but this is normal for her.   Sarah Schmitt was seen in MAU on 07-01-2018 after reporting decreased fetal movements to Dr. Earlene Plateravis during her virtual visit that day.  Review of BPP and MAU course from 07-01-2018 shows BPP 6/8 x 2 with recommendations to continue antenatal testing per protocol. NST was not reassuring on that day ( per MAU note, NST from that day not reviewed at this visit).   At the end of her visit on 07-01-2018, patient left AMA because she needed to pick up her children; [facility] for patient to have follow appt on 6-2.     OB History    Gravida  4   Para  3   Term  3   Preterm      AB  0   Living  3     SAB  0   TAB  0   Ectopic      Multiple  0   Live Births  3           Past Medical History:  Diagnosis Date  . Anemia   . Dyspnea   . Hypotension     Past Surgical History:  Procedure Laterality Date  . c sections    . CESAREAN SECTION    . CESAREAN SECTION N/A 08/28/2016   Procedure: CESAREAN SECTION;  Surgeon: Adam PhenixArnold, James G, MD;  [facility];  Service: Obstetrics;  Laterality: N/A;  .  CESAREAN SECTION N/A 10/02/2017   Procedure: REPEAT CESAREAN SECTION;  Surgeon: Catalina Antiguaonstant, Peggy, MD;  Location: [facility];  Service: Obstetrics;  Laterality: N/A;    Family History  Problem Relation Age of Onset  . Diabetes Father   . Hypertension Maternal Grandmother   . Diabetes Maternal Grandmother   . Diabetes Paternal Grandmother   . Hypertension Paternal Grandmother   . Diabetes Mother     Social History   Tobacco Use  . Smoking status: Former Smoker    Types: Cigarettes    Last attempt to quit: 12/11/2017    Years since quitting: 0.5  . Smokeless tobacco: Never Used  Substance Use Topics  . Alcohol use: No  . Drug use: No    Allergies: No Known Allergies  Medications Prior to Admission  Medication Sig Dispense Refill Last Dose  . Cetirizine HCl (ZYRTEC PO) Take by mouth.   More than a month at Unknown time  . Prenat-FeCbn-FeAspGl-FA-Omega (OB COMPLETE PETITE) 35-5-1-200 MG CAPS Take 1 tablet by mouth daily. 30 capsule 12 07/01/2018 at 1100  . [DISCONTINUED] metroNIDAZOLE (FLAGYL) 500 MG tablet Take 1 tablet (500 mg total) by mouth 2 (  two) times daily. 14 tablet 0 Taking  . [DISCONTINUED] terconazole (TERAZOL 7) 0.4 % vaginal cream Place 1 applicator vaginally at bedtime. 45 g 0 Taking    Review of Systems  Constitutional: Negative.   HENT: Negative.   Respiratory: Negative.   Cardiovascular: Negative.   Genitourinary: Negative.   Neurological: Negative.   Hematological: Negative.    Physical Exam   Blood pressure 120/71, pulse 64, temperature 98.1 F (36.7 C), resp. rate 12, last menstrual period 11/10/2017, SpO2 97 %, unknown if currently breastfeeding.  Physical Exam  Constitutional: She is oriented to person, place, and time. She appears well-developed.  HENT:  Head: Normocephalic.  Neck: Normal range of motion.  Respiratory: Effort normal.  GI: Soft. Bowel sounds are normal.  Genitourinary:    Vagina normal.     No vaginal discharge.    Musculoskeletal: Normal range of motion.  Neurological: She is alert and oriented to person, place, and time.  Skin: Skin is warm and dry.    MAU Course  Procedures  MDM Upon entry to her MAU room, patient became  agitated at RN's questions. States that she is frustrated at being sent over from [facility] for further monitoring; she does not understand why she was not given more of explanation of the urgency for her to visit MAU. States that she does not want to be hooked up to monitors again.   NST: reactive; both babies were heard kicking on monitor and had good variability and accelerations. Uterine irritability and contractions noted but patient states that she always has contractions now.  Baby A: 130, mod var, present acel, neg decels, small non-painful contractions noted;  Baby B: 135, mod var, presnt acel, neg decels, small non-painful contractions noted.  Assessment and Plan   1. NST (non-stress test) reactive   2. Bacterial vaginosis    2. Encouraged patient to pick up her flagyl  and her terazole cream; patient agrees to do so.  3. Rx for Hemocyte sent to pharmacy.  4. Patient to have follow-up US tomorrow; given reassuring NST, will not repeat BPP today.  5. Support and reassurance given to patient; she understands importance of keeping appt tomorrow.  6. Strict return precautions given; all questions answered.  Charlesetta Garibaldi Kooistra 07/03/2018, 7:22 PM

## 2018-07-03 NOTE — Discharge Instructions (Signed)

## 2018-07-04 ENCOUNTER — Other Ambulatory Visit: Payer: Self-pay

## 2018-07-04 ENCOUNTER — Ambulatory Visit (HOSPITAL_COMMUNITY): Admission: RE | Admit: 2018-07-04 | Payer: Medicaid Other | Source: Ambulatory Visit

## 2018-07-04 ENCOUNTER — Encounter (HOSPITAL_COMMUNITY): Payer: Self-pay

## 2018-07-04 ENCOUNTER — Ambulatory Visit (HOSPITAL_COMMUNITY): Payer: Medicaid Other

## 2018-07-04 DIAGNOSIS — Z3480 Encounter for supervision of other normal pregnancy, unspecified trimester: Secondary | ICD-10-CM

## 2018-07-04 MED ORDER — OB COMPLETE PETITE 35-5-1-200 MG PO CAPS: 1.0000 | ORAL_CAPSULE | Freq: Every day | ORAL | 3 refills | Status: AC

## 2018-07-04 NOTE — Progress Notes (Signed)
Refills for PNV sent to Pharmacy.

## 2018-07-05 ENCOUNTER — Other Ambulatory Visit: Payer: Medicaid Other

## 2018-07-09 ENCOUNTER — Other Ambulatory Visit: Payer: Self-pay

## 2018-07-09 ENCOUNTER — Other Ambulatory Visit: Payer: Medicaid Other

## 2018-07-09 ENCOUNTER — Ambulatory Visit (INDEPENDENT_AMBULATORY_CARE_PROVIDER_SITE_OTHER): Payer: Medicaid Other | Admitting: Obstetrics & Gynecology

## 2018-07-09 VITALS — BP 123/74 | HR 87 | Wt 207.8 lb

## 2018-07-09 DIAGNOSIS — Z3480 Encounter for supervision of other normal pregnancy, unspecified trimester: Secondary | ICD-10-CM

## 2018-07-09 DIAGNOSIS — Z3A34 34 weeks gestation of pregnancy: Secondary | ICD-10-CM | POA: Diagnosis not present

## 2018-07-09 DIAGNOSIS — O30043 Twin pregnancy, dichorionic/diamniotic, third trimester: Secondary | ICD-10-CM | POA: Diagnosis not present

## 2018-07-09 DIAGNOSIS — O0993 Supervision of high risk pregnancy, unspecified, third trimester: Secondary | ICD-10-CM

## 2018-07-09 NOTE — Progress Notes (Signed)
   PRENATAL VISIT NOTE  Subjective:  Sarah Schmitt is a 23 y.o. [phone] at [redacted]w[redacted]d being seen today for ongoing prenatal care.  She is currently monitored for the following issues for this high-risk pregnancy and has History of cesarean delivery x 3 affecting pregnancy; Late prenatal care; Anemia in pregnancy, second trimester; Supervision of high-risk pregnancy; Twin gestation, dichorionic diamniotic; and Group B streptococcal bacteriuria on their problem list.  Patient reports backache.  Contractions: Irritability. Vag. Bleeding: None.  Movement: Present. Denies leaking of fluid.   The following portions of the patient's history were reviewed and updated as appropriate: allergies, current medications, past family history, past medical history, past social history, past surgical history and problem list.   Objective:   Vitals:   07/09/18 1034  BP: 123/74  Pulse: 87  Weight: 207 lb 12.8 oz (94.3 kg)    Fetal Status: Fetal Heart Rate (bpm): A:135/ B:136   Movement: Present     General:  Alert, oriented and cooperative. Patient is in no acute distress.  Skin: Skin is warm and dry. No rash noted.   Cardiovascular: Normal heart rate noted  Respiratory: Normal respiratory effort, no problems with respiration noted  Abdomen: Soft, gravid, appropriate for gestational age.  Pain/Pressure: Absent     Pelvic: Cervical exam deferred        Extremities: Normal range of motion.  Edema: Mild pitting, slight indentation  Mental Status: Normal mood and affect. Normal behavior. Normal judgment and thought content.   Assessment and Plan:  Pregnancy: [id number] at [redacted]w[redacted]d 1. Supervision of high risk pregnancy in third trimester Routine testing - Glucose tolerance, 1 hour - Glucose, Fasting - Ambulatory referral to Chiropractic - [place] MFM OB FOLLOW UP ADDL GEST; Future  2. Dichorionic diamniotic twin pregnancy in third trimester Significant pain not relieved with Flexeril or Tylenol needs  chiropractic. Has little social support - Ambulatory referral to Chiropractic - [place] MFM OB FOLLOW UP ADDL GEST; Future  Preterm labor symptoms and general obstetric precautions including but not limited to vaginal bleeding, contractions, leaking of fluid and fetal movement were reviewed in detail with the patient. Please refer to After Visit Summary for other counseling recommendations.   Return in about 1 week (around 07/16/2018).  Future Appointments  Date Time Provider [place]  07/15/2018  3:30 PM Sloan Leiter, MD [facility] None    Emeterio Reeve, MD

## 2018-07-09 NOTE — Progress Notes (Signed)
ROB/ 1 hr. GTT. C/o swollen ankles.

## 2018-07-10 LAB — GLUCOSE, FASTING: Glucose, Plasma: 101 mg/dL — ABNORMAL HIGH (ref 65–99)

## 2018-07-10 LAB — GLUCOSE TOLERANCE, 1 HOUR: Glucose, 1Hr PP: 106 mg/dL (ref 65–199)

## 2018-07-15 ENCOUNTER — Encounter: Payer: Medicaid Other | Admitting: Obstetrics and Gynecology

## 2018-07-16 ENCOUNTER — Other Ambulatory Visit: Payer: Self-pay

## 2018-07-16 ENCOUNTER — Encounter: Payer: Medicaid Other | Admitting: Obstetrics and Gynecology

## 2018-07-16 ENCOUNTER — Encounter (HOSPITAL_COMMUNITY): Payer: Self-pay

## 2018-07-16 ENCOUNTER — Inpatient Hospital Stay (HOSPITAL_COMMUNITY)
Admission: AD | Admit: 2018-07-16 | Discharge: 2018-07-16 | Disposition: A | Discharge status: Home / Self Care | Payer: Medicaid Other | Attending: Obstetrics and Gynecology | Admitting: Obstetrics and Gynecology

## 2018-07-16 DIAGNOSIS — O0993 Supervision of high risk pregnancy, unspecified, third trimester: Secondary | ICD-10-CM

## 2018-07-16 DIAGNOSIS — Z3A35 35 weeks gestation of pregnancy: Secondary | ICD-10-CM | POA: Insufficient documentation

## 2018-07-16 DIAGNOSIS — N859 Noninflammatory disorder of uterus, unspecified: Secondary | ICD-10-CM

## 2018-07-16 DIAGNOSIS — O4703 False labor before 37 completed weeks of gestation, third trimester: Secondary | ICD-10-CM

## 2018-07-16 DIAGNOSIS — Z87891 Personal history of nicotine dependence: Secondary | ICD-10-CM | POA: Insufficient documentation

## 2018-07-16 DIAGNOSIS — O30043 Twin pregnancy, dichorionic/diamniotic, third trimester: Secondary | ICD-10-CM | POA: Diagnosis not present

## 2018-07-16 DIAGNOSIS — N858 Other specified noninflammatory disorders of uterus: Secondary | ICD-10-CM

## 2018-07-16 LAB — URINALYSIS, ROUTINE W REFLEX MICROSCOPIC
Bilirubin Urine: NEGATIVE
Glucose, UA: NEGATIVE mg/dL
Hgb urine dipstick: NEGATIVE
Ketones, ur: NEGATIVE mg/dL
Nitrite: NEGATIVE
Protein, ur: NEGATIVE mg/dL
Specific Gravity, Urine: 1.012 (ref 1.005–1.030)
pH: 7 (ref 5.0–8.0)

## 2018-07-16 MED ORDER — LACTATED RINGERS IV BOLUS
1000.0000 mL | Freq: Once | INTRAVENOUS | Status: AC
  Administered 2018-07-16: 15:00:00 1000 mL via INTRAVENOUS

## 2018-07-16 MED ORDER — NIFEDIPINE 10 MG PO CAPS
10.0000 mg | ORAL_CAPSULE | ORAL | Status: AC
  Administered 2018-07-16 (×2): 10 mg via ORAL
  Filled 2018-07-16 (×2): qty 1

## 2018-07-16 NOTE — Discharge Instructions (Signed)
Braxton Hicks Contractions Contractions of the uterus can occur throughout pregnancy, but they are not always a sign that you are in labor. You may have practice contractions called Braxton Hicks contractions. These false labor contractions are sometimes confused with true labor. What are Braxton Hicks contractions? Braxton Hicks contractions are tightening movements that occur in the muscles of the uterus before labor. Unlike true labor contractions, these contractions do not result in opening (dilation) and thinning of the cervix. Toward the end of pregnancy (32-34 weeks), Braxton Hicks contractions can happen more often and may become stronger. These contractions are sometimes difficult to tell apart from true labor because they can be very uncomfortable. You should not feel embarrassed if you go to the hospital with false labor. Sometimes, the only way to tell if you are in true labor is for your health care provider to look for changes in the cervix. The health care provider will do a physical exam and may monitor your contractions. If you are not in true labor, the exam should show that your cervix is not dilating and your water has not broken. If there are no other health problems associated with your pregnancy, it is completely safe for you to be sent home with false labor. You may continue to have Braxton Hicks contractions until you go into true labor. How to tell the difference between true labor and false labor True labor  Contractions last 30-70 seconds.  Contractions become very regular.  Discomfort is usually felt in the top of the uterus, and it spreads to the lower abdomen and low back.  Contractions do not go away with walking.  Contractions usually become more intense and increase in frequency.  The cervix dilates and gets thinner. False labor  Contractions are usually shorter and not as strong as true labor contractions.  Contractions are usually irregular.  Contractions  are often felt in the front of the lower abdomen and in the groin.  Contractions may go away when you walk around or change positions while lying down.  Contractions get weaker and are shorter-lasting as time goes on.  The cervix usually does not dilate or become thin. Follow these instructions at home:   Take over-the-counter and prescription medicines only as told by your health care provider.  Keep up with your usual exercises and follow other instructions from your health care provider.  Eat and drink lightly if you think you are going into labor.  If Braxton Hicks contractions are making you uncomfortable: ? Change your position from lying down or resting to walking, or change from walking to resting. ? Sit and rest in a tub of warm water. ? Drink enough fluid to keep your urine pale yellow. Dehydration may cause these contractions. ? Do slow and deep breathing several times an hour.  Keep all follow-up prenatal visits as told by your health care provider. This is important. Contact a health care provider if:  You have a fever.  You have continuous pain in your abdomen. Get help right away if:  Your contractions become stronger, more regular, and closer together.  You have fluid leaking or gushing from your vagina.  You pass blood-tinged mucus (bloody show).  You have bleeding from your vagina.  You have low back pain that you never had before.  You feel your baby's head pushing down and causing pelvic pressure.  Your baby is not moving inside you as much as it used to. Summary  Contractions that occur before labor are   called Braxton Hicks contractions, false labor, or practice contractions.  Braxton Hicks contractions are usually shorter, weaker, farther apart, and less regular than true labor contractions. True labor contractions usually become progressively stronger and regular, and they become more frequent.  Manage discomfort from Braxton Hicks contractions  by changing position, resting in a warm bath, drinking plenty of water, or practicing deep breathing. This information is not intended to replace advice given to you by your health care provider. Make sure you discuss any questions you have with your health care provider. Document Released: 06/01/2016 Document Revised: 10/31/2016 Document Reviewed: 06/01/2016 Elsevier Interactive Patient Education  2019 [employer]  

## 2018-07-16 NOTE — MAU Provider Note (Signed)
History     CSN: [id number]  Arrival date and time: 07/16/18 1417   First Provider Initiated Contact with Patient 07/16/18 1451      Chief Complaint  Patient presents with  . Contractions   Sarah Schmitt is a 23 y.o. [phone] at [redacted]w[redacted]d with Di/Di twins here today with contractions. She states that yesterday she had what felt like a contraction that started around 1030 and lasted all day. That pain finally went away this morning around 0730, and now she is having more regular contractions. She denies any VB or LOF. She reports decreased fetal movement today as well. Last time she ate and drank was today at 11:30. Prior c-section x 3, planning repeat. Scheduled for repeat 08/03/2018.  Pelvic Pain The patient's primary symptoms include pelvic pain. The patient's pertinent negatives include no vaginal discharge. This is a new problem. The current episode started yesterday. The problem occurs intermittently. The problem has been unchanged. The pain is moderate. The problem affects both sides. She is pregnant. Pertinent negatives include no chills, dysuria, fever, frequency, nausea or vomiting. The vaginal discharge was normal. There has been no bleeding. Nothing aggravates the symptoms. She has tried nothing for the symptoms.    OB History    Gravida  4   Para  3   Term  3   Preterm      AB  0   Living  3     SAB  0   TAB  0   Ectopic      Multiple  0   Live Births  3           Past Medical History:  Diagnosis Date  . Anemia   . Dyspnea   . Hypotension     Past Surgical History:  Procedure Laterality Date  . c sections    . CESAREAN SECTION    . CESAREAN SECTION N/A 08/28/2016   Procedure: CESAREAN SECTION;  Surgeon: Woodroe Mode, MD;  Location: [facility];  Service: Obstetrics;  Laterality: N/A;  . CESAREAN SECTION N/A 10/02/2017   Procedure: REPEAT CESAREAN SECTION;  Surgeon: Mora Bellman, MD;  Location: [facility];  Service:  Obstetrics;  Laterality: N/A;    Family History  Problem Relation Age of Onset  . Diabetes Father   . Hypertension Maternal Grandmother   . Diabetes Maternal Grandmother   . Diabetes Paternal Grandmother   . Hypertension Paternal Grandmother   . Diabetes Mother     Social History   Tobacco Use  . Smoking status: Former Smoker    Types: Cigarettes    Quit date: 12/11/2017    Years since quitting: 0.5  . Smokeless tobacco: Never Used  Substance Use Topics  . Alcohol use: No  . Drug use: No    Allergies: No Known Allergies  Medications Prior to Admission  Medication Sig Dispense Refill Last Dose  . Prenat-FeCbn-FeAspGl-FA-Omega (OB COMPLETE PETITE) 35-5-1-200 MG CAPS Take 1 tablet by mouth daily for 30 days. 30 capsule 3 07/15/2018 at Unknown time  . Cetirizine HCl (ZYRTEC PO) Take by mouth.     . cyclobenzaprine (FLEXERIL) 10 MG tablet Take 1 tablet (10 mg total) by mouth 3 (three) times daily as needed for muscle spasms. 30 tablet 1   . docusate sodium (COLACE) 100 MG capsule Take 1 capsule (100 mg total) by mouth every 12 (twelve) hours. 60 capsule 0   . ferrous fumarate (HEMOCYTE - 106 MG FE) 325 (106 Fe) MG  TABS tablet Take 1 tablet (106 mg of iron total) by mouth daily. 30 each 0   . metroNIDAZOLE (FLAGYL) 500 MG tablet Take 1 tablet (500 mg total) by mouth 2 (two) times daily. 14 tablet 0   . terconazole (TERAZOL 7) 0.4 % vaginal cream Place 1 applicator vaginally at bedtime. 45 g 0     Review of Systems  Constitutional: Negative for chills and fever.  Gastrointestinal: Negative for nausea and vomiting.  Genitourinary: Positive for pelvic pain. Negative for dysuria, frequency, vaginal bleeding and vaginal discharge.   Physical Exam   Blood pressure 117/75, pulse 83, temperature 98.3 F (36.8 C), temperature source Oral, resp. rate 16, height 5\' 4"  (1.626 m), weight 92.5 kg, last menstrual period 11/10/2017, SpO2 98 %, unknown if currently breastfeeding.  Physical  Exam  Nursing note and vitals reviewed. Constitutional: She is oriented to person, place, and time. She appears well-developed and well-nourished. No distress.  HENT:  Head: Normocephalic.  Cardiovascular: Normal rate.  Respiratory: Effort normal.  GI: Soft. There is no abdominal tenderness. There is no rebound.  Genitourinary:    Genitourinary Comments:  Dilation: Fingertip Effacement (%): Thick Exam by:: Thressa ShellerHeather  CNM    Neurological: She is alert and oriented to person, place, and time.  Skin: Skin is warm and dry.  Psychiatric: She has a normal mood and affect.   NST: A Baseline: 155 Variability: moderate Accels: 15x15 Decels: none Toco: irregular, mostly irritability   NST:  Baseline: 145 Variability: moderate Accels: 15x15 Decels: none Toco: irregular, mostly irritability   MAU Course  Procedures  MDM Patient has had LR bolus and 2 doses of procardia. She reports that she is feeling better. Pain has improved. Less irritability tracing on the monitor.   Assessment and Plan   1. Uterine irritability   2. Supervision of high risk pregnancy in third trimester   3. Threatened premature labor in third trimester   4. [redacted] weeks gestation of pregnancy   5. Dichorionic diamniotic twin pregnancy in third trimester    DC home Comfort measures reviewed  3rd Trimester precautions  PTL precautions  Fetal kick counts RX: none  Return to MAU as needed FU with OB as planned  Follow-up Information    [facility] Follow up.   Contact information: [address] [place] [id number] [phone]         Thressa ShellerHeather  DNP, CNM  07/16/18  5:13 PM

## 2018-07-16 NOTE — MAU Note (Signed)
TWINS, Contractions all day yesterday.  No bleeding. No leaking.  No baby movement felt since Sunday.

## 2018-07-18 ENCOUNTER — Encounter (HOSPITAL_COMMUNITY): Payer: Self-pay

## 2018-07-18 NOTE — Patient Instructions (Signed)
Sarah Schmitt  07/18/2018   Your procedure is scheduled on:  08/03/2018  Arrive at 46 at [employer] C on [address] at [facility]  and [employer]. You are invited to use the FREE valet parking or use the Visitor's parking deck.  Pick up the phone at the desk and dial [phone].  Call this number if you have problems the morning of surgery: [phone]  Remember:   Do not eat food:(After Midnight) Desps de medianoche.  Do not drink clear liquids: (After Midnight) Desps de medianoche.  Take these medicines the morning of surgery with A SIP OF WATER:  none   Do not wear jewelry, make-up or nail polish.  Do not wear lotions, powders, or perfumes. Do not wear deodorant.  Do not shave 48 hours prior to surgery.  Do not bring valuables to the hospital.  [facility] is not   responsible for any belongings or valuables brought to the hospital.  Contacts, dentures or bridgework may not be worn into surgery.  Leave suitcase in the car. After surgery it may be brought to your room.  For patients admitted to the hospital, checkout time is 11:00 AM the day of              discharge.      Please read over the following fact sheets that you were given:     Preparing for Surgery

## 2018-07-23 ENCOUNTER — Encounter: Payer: Medicaid Other | Admitting: Obstetrics and Gynecology

## 2018-07-24 ENCOUNTER — Encounter: Payer: Self-pay | Admitting: Obstetrics and Gynecology

## 2018-07-26 ENCOUNTER — Other Ambulatory Visit: Payer: Self-pay

## 2018-07-26 ENCOUNTER — Emergency Department (HOSPITAL_COMMUNITY)
Admission: EM | Admit: 2018-07-26 | Discharge: 2018-07-26 | Disposition: A | Discharge status: Home / Self Care | Payer: Medicaid Other | Attending: Emergency Medicine | Admitting: Emergency Medicine

## 2018-07-26 ENCOUNTER — Encounter (HOSPITAL_COMMUNITY): Payer: Self-pay | Admitting: Emergency Medicine

## 2018-07-26 DIAGNOSIS — Z3A37 37 weeks gestation of pregnancy: Secondary | ICD-10-CM | POA: Diagnosis not present

## 2018-07-26 DIAGNOSIS — Z20828 Contact with and (suspected) exposure to other viral communicable diseases: Secondary | ICD-10-CM | POA: Diagnosis not present

## 2018-07-26 DIAGNOSIS — Z87891 Personal history of nicotine dependence: Secondary | ICD-10-CM | POA: Diagnosis not present

## 2018-07-26 DIAGNOSIS — O99513 Diseases of the respiratory system complicating pregnancy, third trimester: Secondary | ICD-10-CM | POA: Diagnosis not present

## 2018-07-26 DIAGNOSIS — R070 Pain in throat: Secondary | ICD-10-CM | POA: Diagnosis not present

## 2018-07-26 DIAGNOSIS — Z20822 Contact with and (suspected) exposure to covid-19: Secondary | ICD-10-CM

## 2018-07-26 DIAGNOSIS — O9989 Other specified diseases and conditions complicating pregnancy, childbirth and the puerperium: Secondary | ICD-10-CM | POA: Diagnosis not present

## 2018-07-26 DIAGNOSIS — R0602 Shortness of breath: Secondary | ICD-10-CM | POA: Diagnosis not present

## 2018-07-26 DIAGNOSIS — U071 COVID-19: Secondary | ICD-10-CM | POA: Insufficient documentation

## 2018-07-26 NOTE — ED Provider Notes (Signed)
[facility] Provider Note   CSN: [id number] Arrival date & time: 07/26/18  1950     History   Chief Complaint Chief Complaint  Patient presents with  . SOB/Cough    Family Member +COVID @home     HPI Sarah Schmitt is a 23 y.o. female.     Patient who is [redacted] weeks pregnant presents to the emergency department today with concern for coronavirus.  Patient reports several order family members, with whom she has been in close contact with, have tested positive for coronavirus.  Patient reports 3 days of symptoms including sore throat, nausea, vomiting, diarrhea, cough.  She has had some mild chest tightness and shortness of breath.  Yesterday she noted a fever to 100.3 F.  No treatments prior to arrival.  Patient states that she is due to have a C-section next week.  She was having difficulty sleeping last night because of her symptoms. The onset of this condition was acute. The course is constant. Aggravating factors: none. Alleviating factors: none.       Past Medical History:  Diagnosis Date  . Anemia   . Dyspnea   . Hypotension     Patient Active Problem List   Diagnosis Date Noted  . Group B streptococcal bacteriuria 04/12/2018  . Twin gestation, dichorionic diamniotic 04/05/2018  . Supervision of high-risk pregnancy 03/04/2018  . Anemia in pregnancy, second trimester 05/28/2017  . Late prenatal care 04/03/2016  . History of cesarean delivery x 3 affecting pregnancy 03/14/2016    Past Surgical History:  Procedure Laterality Date  . c sections    . CESAREAN SECTION    . CESAREAN SECTION N/A 08/28/2016   Procedure: CESAREAN SECTION;  Surgeon: Adam PhenixArnold, James G, MD;  [facility];  Service: Obstetrics;  Laterality: N/A;  . CESAREAN SECTION N/A 10/02/2017   Procedure: REPEAT CESAREAN SECTION;  Surgeon: Catalina Antiguaonstant, Peggy, MD;  [facility];  Service: Obstetrics;  Laterality: N/A;     OB History    Gravida  4   Para  3   Term  3   Preterm      AB  0   Living  3     SAB  0   TAB  0   Ectopic      Multiple  0   Live Births  3            Home Medications    Prior to Admission medications   Medication Sig Start Date End Date Taking? Authorizing Provider  Prenat-FeCbn-FeAspGl-FA-Omega (OB COMPLETE PETITE) 35-5-1-200 MG CAPS Take 1 tablet by mouth daily for 30 days. 07/04/18 08/03/18  Brock BadHarper, Charles A, MD  ferrous fumarate (HEMOCYTE - 106 MG FE) 325 (106 Fe) MG TABS tablet Take 1 tablet (106 mg of iron total) by mouth daily. Patient not taking: Reported on 07/26/2018 07/03/18 07/26/18  Marylene LandKooistra, Kathryn Lorraine, CNM    Family History Family History  Problem Relation Age of Onset  . Diabetes Father   . Hypertension Maternal Grandmother   . Diabetes Maternal Grandmother   . Diabetes Paternal Grandmother   . Hypertension Paternal Grandmother   . Diabetes Mother     Social History Social History   Tobacco Use  . Smoking status: Former Smoker    Types: Cigarettes    Quit date: 12/11/2017    Years since quitting: 0.6  . Smokeless tobacco: Never Used  Substance Use Topics  . Alcohol use: No  . Drug  use: No     Allergies   Patient has no known allergies.   Review of Systems Review of Systems  Constitutional: Positive for chills and fever.  HENT: Positive for sore throat. Negative for rhinorrhea.   Eyes: Negative for redness.  Respiratory: Positive for cough, chest tightness and shortness of breath.   Cardiovascular: Positive for chest pain.  Gastrointestinal: Positive for diarrhea, nausea and vomiting. Negative for abdominal pain.  Genitourinary: Negative for dysuria.  Musculoskeletal: Positive for myalgias.  Skin: Negative for rash.  Neurological: Negative for headaches.     Physical Exam Updated Vital Signs BP (!) 142/82 (BP Location: Right Arm)   Pulse 85   Temp 97.7 F (36.5 C) (Oral)   Resp 16   LMP 11/10/2017   SpO2 99%   Physical  Exam Vitals signs and nursing note reviewed.  Constitutional:      Appearance: She is well-developed.  HENT:     Head: Normocephalic and atraumatic.     Jaw: No trismus.     Right Ear: Tympanic membrane, ear canal and external ear normal.     Left Ear: Tympanic membrane, ear canal and external ear normal.     Nose: Nose normal. No mucosal edema or rhinorrhea.     Mouth/Throat:     Mouth: Mucous membranes are not dry. No oral lesions.     Pharynx: Uvula midline. No oropharyngeal exudate, posterior oropharyngeal erythema or uvula swelling.     Tonsils: No tonsillar abscesses.  Eyes:     General:        Right eye: No discharge.        Left eye: No discharge.     Conjunctiva/sclera: Conjunctivae normal.  Neck:     Musculoskeletal: Normal range of motion and neck supple.  Cardiovascular:     Rate and Rhythm: Normal rate and regular rhythm.     Heart sounds: Normal heart sounds.  Pulmonary:     Effort: Pulmonary effort is normal. No respiratory distress.     Breath sounds: Normal breath sounds. No wheezing or rales.  Abdominal:     Palpations: Abdomen is soft.     Tenderness: There is no abdominal tenderness.  Lymphadenopathy:     Cervical: No cervical adenopathy.  Skin:    General: Skin is warm and dry.  Neurological:     Mental Status: She is alert.      ED Treatments / Results  Labs (all labs ordered are listed, but only abnormal results are displayed) Labs Reviewed  NOVEL CORONAVIRUS, NAA (HOSPITAL ORDER, SEND-OUT TO REF LAB)    EKG    Radiology No results found.  Procedures Procedures (including critical care time)  Medications Ordered in ED Medications - No data to display   Initial Impression / Assessment and Plan / ED Course  I have reviewed the triage vital signs and the nursing notes.  Pertinent labs & imaging results that were available during my care of the patient were reviewed by me and considered in my medical decision making (see chart for  details).        Patient seen and examined.  No hypoxia or tachycardia.  She looks well.  We discussed low utility of x-ray at this point, especially that she is pregnant and she agrees to defer.  Will test patient for COVID-19.  She will call her OB/GYN and let them know the results.  We discussed signs and symptoms that should cause her to return to the emergency department.  Vital signs reviewed and are as follows: BP (!) 142/82 (BP Location: Right Arm)   Pulse 85   Temp 97.7 F (36.5 C) (Oral)   Resp 16   LMP 11/10/2017   SpO2 99%   BP elevated somewhat on recheck. No other sx consistent with preeclampsia. Will need to have BP rechecked by OB/GYN. Encouraged patient to call for recheck.   Final Clinical Impressions(s) / ED Diagnoses   Final diagnoses:  Suspected Covid-19 Virus Infection   Patient with + COVID contact, mild symptoms concerning for coronavirus.  Test pending.  Patient appears well, nontoxic.  Comfortable with discharge home at this time. Will need BP rechecked by OB.   ED Discharge Orders    None       Renne CriglerGeiple, Crystall Donaldson, Cordelia Poche-C 07/26/18 2220    Gerhard MunchLockwood, Robert, MD 07/27/18 2300

## 2018-07-26 NOTE — ED Triage Notes (Signed)
Patient reports persistent SOB with productive cough , emesis /diarrhea and chills onset this week , she is living with her grandmother that is positive for COVID , G4P3 37 weeks scheduled for C- section next week .

## 2018-07-26 NOTE — ED Notes (Signed)
Patient verbalizes understanding of discharge instructions. Opportunity for questioning and answers were provided. Armband removed by staff, pt discharged from ED.  

## 2018-07-26 NOTE — Discharge Instructions (Signed)
Your vital signs look good today.  We will know the results of your coronavirus test in the next 48 to 72 hours.  You should let your OB/GYN know the results of your test.  You should isolate yourself until you have had symptoms for at least 10 days with 3 days of improving symptoms.  You should return to the emergency department if you have worsening shortness of breath, trouble breathing, persistent nausea or vomiting, high fever, any other concerns.

## 2018-07-26 NOTE — Patient Instructions (Signed)
Sarah Schmitt  07/26/2018   Your procedure is scheduled on:  08/03/2018 at 7:30 AM  Arrive at 5:00 AM at Entrance C on [address] at [facility]  and [employer] [address]. You are invited to use the FREE valet parking or use the Visitor's parking deck.  Pick up the phone at the desk and dial [phone].  Call this number if you have problems the morning of surgery: [phone]  Remember:   Do not eat food:(After Midnight) Desps de medianoche.  Do not drink clear liquids: (After Midnight) Desps de medianoche.  Take these medicines the morning of surgery with A SIP OF WATER:  none   Do not wear jewelry, make-up or nail polish.  Do not wear lotions, powders, or perfumes. Do not wear deodorant.  Do not shave 48 hours prior to surgery.  Do not bring valuables to the hospital.  [facility] is not   responsible for any belongings or valuables brought to the hospital.  Contacts, dentures or bridgework may not be worn into surgery.  Leave suitcase in the car. After surgery it may be brought to your room.  For patients admitted to the hospital, checkout time is 11:00 AM the day of              discharge.      Please read over the following fact sheets that you were given:     Preparing for Surgery

## 2018-07-30 LAB — NOVEL CORONAVIRUS, NAA (HOSP ORDER, SEND-OUT TO REF LAB; TAT 18-24 HRS): SARS-CoV-2, NAA: DETECTED — AB

## 2018-07-31 ENCOUNTER — Other Ambulatory Visit (HOSPITAL_COMMUNITY)
Admission: RE | Admit: 2018-07-31 | Discharge: 2018-07-31 | Disposition: A | Discharge status: Home / Self Care | Payer: Medicaid Other | Source: Ambulatory Visit | Attending: Obstetrics and Gynecology | Admitting: Obstetrics and Gynecology

## 2018-08-01 ENCOUNTER — Telehealth: Payer: Self-pay | Admitting: Family Medicine

## 2018-08-01 ENCOUNTER — Encounter: Payer: Self-pay | Admitting: Medical

## 2018-08-01 DIAGNOSIS — U071 COVID-19: Secondary | ICD-10-CM | POA: Insufficient documentation

## 2018-08-01 HISTORY — DX: COVID-19: U07.1

## 2018-08-01 NOTE — Telephone Encounter (Signed)
Patient is COVID + from 6/26. She is scheduled for ERLTCS on 7/4. Discussion with MFM, they suggested waiting 14 days from + test, which would be 7/10 if there isn't any HTN or other issue pushing [place] to deliver. There is some new data suggesting worse outcomes from C-section at time of delivery. She is reporting decreasing symptoms, and re-gaining of her sense of smell. She has not been seen in the office recently. Will bring her in as a scheduled appointment tomorrow at 56 (to call prior to leaving her house) for BP check and NST x 2. If all is well, will move the C-section to a later date.

## 2018-08-02 ENCOUNTER — Inpatient Hospital Stay (HOSPITAL_COMMUNITY)
Admission: AD | Admit: 2018-08-02 | Discharge: 2018-08-02 | Disposition: A | Discharge status: Home / Self Care | Payer: Medicaid Other | Attending: Family Medicine | Admitting: Family Medicine

## 2018-08-02 ENCOUNTER — Other Ambulatory Visit: Payer: Self-pay

## 2018-08-02 DIAGNOSIS — Z3689 Encounter for other specified antenatal screening: Secondary | ICD-10-CM | POA: Diagnosis not present

## 2018-08-02 DIAGNOSIS — Z3A37 37 weeks gestation of pregnancy: Secondary | ICD-10-CM | POA: Diagnosis not present

## 2018-08-02 DIAGNOSIS — O30043 Twin pregnancy, dichorionic/diamniotic, third trimester: Secondary | ICD-10-CM | POA: Diagnosis not present

## 2018-08-02 DIAGNOSIS — O98513 Other viral diseases complicating pregnancy, third trimester: Secondary | ICD-10-CM | POA: Insufficient documentation

## 2018-08-02 DIAGNOSIS — O0993 Supervision of high risk pregnancy, unspecified, third trimester: Secondary | ICD-10-CM

## 2018-08-02 DIAGNOSIS — U071 COVID-19: Secondary | ICD-10-CM | POA: Diagnosis not present

## 2018-08-02 DIAGNOSIS — O26893 Other specified pregnancy related conditions, third trimester: Secondary | ICD-10-CM | POA: Diagnosis not present

## 2018-08-02 NOTE — MAU Note (Signed)
Pt was to come in at 1100 today for NST.   Pt called in around noon, 'having car trouble, stated if not too late she could come then'.   Room 26 was not available, got pt's phone #- said would call her when available.  Called pt at 1245, went to voice mail, message left for her to come in at that time for NST.  Pt returned called around 1400, that she was at [facility] and would come when finished.  Dr Kennon Rounds aware of above.  Pt arrived now.

## 2018-08-02 NOTE — Discharge Instructions (Signed)
Person Under Monitoring Name: Sarah Schmitt  Location: [address] [address]   Infection Prevention Recommendations for Individuals Confirmed to have, or Being Evaluated for, 2019 Novel Coronavirus (COVID-19) Infection Who Receive Care at Home  Individuals who are confirmed to have, or are being evaluated for, COVID-19 should follow the prevention steps below until a healthcare provider or local or state health department says they can return to normal activities.  Stay home except to get medical care You should restrict activities outside your home, except for getting medical care. Do not go to work, school, or public areas, and do not use public transportation or taxis.  Call ahead before visiting your doctor Before your medical appointment, call the healthcare provider and tell them that you have, or are being evaluated for, COVID-19 infection. This will help the healthcare providers office take steps to keep other people from getting infected. Ask your healthcare provider to call the local or state health department.  Monitor your symptoms Seek prompt medical attention if your illness is worsening (e.g., difficulty breathing). Before going to your medical appointment, call the healthcare provider and tell them that you have, or are being evaluated for, COVID-19 infection. Ask your healthcare provider to call the local or state health department.  Wear a facemask You should wear a facemask that covers your nose and mouth when you are in the same room with other people and when you visit a healthcare provider. People who live with or visit you should also wear a facemask while they are in the same room with you.  Separate yourself from other people in your home As much as possible, you should stay in a different room from other people in your home. Also, you should use a separate bathroom, if available.  Avoid sharing household items You should not  share dishes, drinking glasses, cups, eating utensils, towels, bedding, or other items with other people in your home. After using these items, you should wash them thoroughly with soap and water.  Cover your coughs and sneezes Cover your mouth and nose with a tissue when you cough or sneeze, or you can cough or sneeze into your sleeve. Throw used tissues in a lined trash can, and immediately wash your hands with soap and water for at least 20 seconds or use an alcohol-based hand rub.  Wash your [facility] your hands often and thoroughly with soap and water for at least 20 seconds. You can use an alcohol-based hand sanitizer if soap and water are not available and if your hands are not visibly dirty. Avoid touching your eyes, nose, and mouth with unwashed hands.   Prevention Steps for Caregivers and Household Members of Individuals Confirmed to have, or Being Evaluated for, COVID-19 Infection Being Cared for in the Home  If you live with, or provide care at home for, a person confirmed to have, or being evaluated for, COVID-19 infection please follow these guidelines to prevent infection:  Follow healthcare providers instructions Make sure that you understand and can help the patient follow any healthcare provider instructions for all care.  Provide for the patients basic needs You should help the patient with basic needs in the home and provide support for getting groceries, prescriptions, and other personal needs.  Monitor the patients symptoms If they are getting sicker, call his or her medical provider and tell them that the patient has, or is being evaluated for, COVID-19 infection. This will help the healthcare providers office  take steps to keep other people from getting infected. Ask the healthcare provider to call the local or state health department.  Limit the number of people who have contact with the patient  If possible, have only one caregiver for the  patient.  Other household members should stay in another home or place of residence. If this is not possible, they should stay  in another room, or be separated from the patient as much as possible. Use a separate bathroom, if available.  Restrict visitors who do not have an essential need to be in the home.  Keep older adults, very young children, and other sick people away from the patient Keep older adults, very young children, and those who have compromised immune systems or chronic health conditions away from the patient. This includes people with chronic heart, lung, or kidney conditions, diabetes, and cancer.  Ensure good ventilation Make sure that shared spaces in the home have good air flow, such as from an air conditioner or an opened window, weather permitting.  Wash your hands often  Wash your hands often and thoroughly with soap and water for at least 20 seconds. You can use an alcohol based hand sanitizer if soap and water are not available and if your hands are not visibly dirty.  Avoid touching your eyes, nose, and mouth with unwashed hands.  Use disposable paper towels to dry your hands. If not available, use dedicated cloth towels and replace them when they become wet.  Wear a facemask and gloves  Wear a disposable facemask at all times in the room and gloves when you touch or have contact with the patients blood, body fluids, and/or secretions or excretions, such as sweat, saliva, sputum, nasal mucus, vomit, urine, or feces.  Ensure the mask fits over your nose and mouth tightly, and do not touch it during use.  Throw out disposable facemasks and gloves after using them. Do not reuse.  Wash your hands immediately after removing your facemask and gloves.  If your personal clothing becomes contaminated, carefully remove clothing and launder. Wash your hands after handling contaminated clothing.  Place all used disposable facemasks, gloves, and other waste in a lined  container before disposing them with other household waste.  Remove gloves and wash your hands immediately after handling these items.  Do not share dishes, glasses, or other household items with the patient  Avoid sharing household items. You should not share dishes, drinking glasses, cups, eating utensils, towels, bedding, or other items with a patient who is confirmed to have, or being evaluated for, COVID-19 infection.  After the person uses these items, you should wash them thoroughly with soap and water.  Wash laundry thoroughly  Immediately remove and wash clothes or bedding that have blood, body fluids, and/or secretions or excretions, such as sweat, saliva, sputum, nasal mucus, vomit, urine, or feces, on them.  Wear gloves when handling laundry from the patient.  Read and follow directions on labels of laundry or clothing items and detergent. In general, wash and dry with the warmest temperatures recommended on the label.  Clean all areas the individual has used often  Clean all touchable surfaces, such as counters, tabletops, doorknobs, bathroom fixtures, toilets, phones, keyboards, tablets, and bedside tables, every day. Also, clean any surfaces that may have blood, body fluids, and/or secretions or excretions on them.  Wear gloves when cleaning surfaces the patient has come in contact with.  Use a diluted bleach solution (e.g., dilute bleach with 1 part  bleach and 10 parts water) or a household disinfectant with a label that says EPA-registered for coronaviruses. To make a bleach solution at home, add 1 tablespoon of bleach to 1 quart (4 cups) of water. For a larger supply, add  cup of bleach to 1 gallon (16 cups) of water.  Read labels of cleaning products and follow recommendations provided on product labels. Labels contain instructions for safe and effective use of the cleaning product including precautions you should take when applying the product, such as wearing gloves or  eye protection and making sure you have good ventilation during use of the product.  Remove gloves and wash hands immediately after cleaning.  Monitor yourself for signs and symptoms of illness Caregivers and household members are considered close contacts, should monitor their health, and will be asked to limit movement outside of the home to the extent possible. Follow the monitoring steps for close contacts listed on the symptom monitoring form.   ? If you have additional questions, contact your local health department or call the epidemiologist on call at [phone] (available 24/7). ? This guidance is subject to change. For the most up-to-date guidance from [facility], please refer to their website: [email or web address]

## 2018-08-02 NOTE — MAU Provider Note (Addendum)
S: Sarah Schmitt is a 23 y.o. [phone] at [redacted]w[redacted]d with twin gestation who presents to MAU today for NST and BP check.   Patient scheduled for repeat C/S on July 10th and is COVID positive.   Fetal Monitoring: Baseline: 125 Variability: Moderate Accelerations: Present Decelerations: None Contractions: Occasional with Irritability   Baseline: 130 Variability: Moderate Accelerations: Present Decelerations: None  Physical Exam  Constitutional: She is oriented to person, place, and time. She appears well-developed and well-nourished. She appears lethargic. She appears ill.  HENT:  Head: Normocephalic and atraumatic.  Eyes: Conjunctivae are normal.  Cardiovascular: Normal rate.  Pulmonary/Chest: Effort normal. No respiratory distress.  Abdominal: Soft. There is no abdominal tenderness.  Musculoskeletal: Normal range of motion.  Neurological: She is oriented to person, place, and time. She appears lethargic.  Skin: Skin is warm and dry.  Psychiatric: She has a normal mood and affect. Her behavior is normal.  Vitals reviewed.    MDM  NST reviewed.   A: TIUP at [redacted]w[redacted]d  Cat I FT NST Reactive BP Normotensive Covid Positive  P: -Informed of NST results as reactive for both babies. -Educated on importance of self-quarantine for at least 2 weeks s/p positive diagnosis. -Instructed to not "run errands" and maintain social distancing from family. -Informed of need to return to MAU for repeat NST and BP Check in 4 days. -Instructed that she needs to call and check availability of negative pressure room prior to arrival. -Patient verbalized understanding of all instructions and education given. -Questions when she will be allowed to see her kids who are in the care of her sister.  Informed that she should wait until after she is discharged from hospital s/p C/S. -Patient without further questions or concerns.  -Labor precautions given -Patient to return to MAU on Tuesday July  7th for repeat NST and BP check or if labor occurs prior to this date. -Discharged to home in stable condition  Gavin Pound, [place] 08/02/2018 5:46 PM

## 2018-08-02 NOTE — MAU Note (Signed)
Pt here for NST. Supposed top come earlier today but went to [facility] instead and called that she was coming later.

## 2018-08-08 ENCOUNTER — Telehealth (HOSPITAL_COMMUNITY): Payer: Self-pay | Admitting: *Deleted

## 2018-08-08 NOTE — Telephone Encounter (Signed)
Preadmission screen  

## 2018-08-09 ENCOUNTER — Inpatient Hospital Stay (HOSPITAL_COMMUNITY)
Admission: RE | Admit: 2018-08-09 | Discharge: 2018-08-12 | DRG: 786 | Disposition: A | Discharge status: Home / Self Care | Payer: Medicaid Other | Attending: Obstetrics and Gynecology | Admitting: Obstetrics and Gynecology

## 2018-08-09 ENCOUNTER — Encounter (HOSPITAL_COMMUNITY): Payer: Self-pay

## 2018-08-09 ENCOUNTER — Inpatient Hospital Stay (HOSPITAL_COMMUNITY): Payer: Medicaid Other | Admitting: Certified Registered Nurse Anesthetist

## 2018-08-09 ENCOUNTER — Encounter (HOSPITAL_COMMUNITY)
Admission: RE | Disposition: A | Discharge status: Home / Self Care | Payer: Self-pay | Source: Home / Self Care | Attending: Obstetrics and Gynecology

## 2018-08-09 ENCOUNTER — Telehealth (HOSPITAL_COMMUNITY): Payer: Self-pay | Admitting: *Deleted

## 2018-08-09 DIAGNOSIS — O30043 Twin pregnancy, dichorionic/diamniotic, third trimester: Secondary | ICD-10-CM | POA: Diagnosis not present

## 2018-08-09 DIAGNOSIS — O34219 Maternal care for unspecified type scar from previous cesarean delivery: Secondary | ICD-10-CM

## 2018-08-09 DIAGNOSIS — Z5321 Procedure and treatment not carried out due to patient leaving prior to being seen by health care provider: Secondary | ICD-10-CM | POA: Diagnosis not present

## 2018-08-09 DIAGNOSIS — O9902 Anemia complicating childbirth: Secondary | ICD-10-CM | POA: Diagnosis not present

## 2018-08-09 DIAGNOSIS — Z87891 Personal history of nicotine dependence: Secondary | ICD-10-CM | POA: Diagnosis not present

## 2018-08-09 DIAGNOSIS — O34211 Maternal care for low transverse scar from previous cesarean delivery: Principal | ICD-10-CM | POA: Diagnosis present

## 2018-08-09 DIAGNOSIS — O30049 Twin pregnancy, dichorionic/diamniotic, unspecified trimester: Secondary | ICD-10-CM | POA: Diagnosis present

## 2018-08-09 DIAGNOSIS — U071 COVID-19: Secondary | ICD-10-CM | POA: Diagnosis not present

## 2018-08-09 DIAGNOSIS — Z30430 Encounter for insertion of intrauterine contraceptive device: Secondary | ICD-10-CM

## 2018-08-09 DIAGNOSIS — Z98891 History of uterine scar from previous surgery: Secondary | ICD-10-CM

## 2018-08-09 DIAGNOSIS — O0990 Supervision of high risk pregnancy, unspecified, unspecified trimester: Secondary | ICD-10-CM

## 2018-08-09 DIAGNOSIS — D649 Anemia, unspecified: Secondary | ICD-10-CM | POA: Diagnosis present

## 2018-08-09 DIAGNOSIS — O99824 Streptococcus B carrier state complicating childbirth: Secondary | ICD-10-CM | POA: Diagnosis not present

## 2018-08-09 DIAGNOSIS — O99019 Anemia complicating pregnancy, unspecified trimester: Secondary | ICD-10-CM | POA: Diagnosis present

## 2018-08-09 DIAGNOSIS — Z3A00 Weeks of gestation of pregnancy not specified: Secondary | ICD-10-CM | POA: Diagnosis not present

## 2018-08-09 DIAGNOSIS — Z3A38 38 weeks gestation of pregnancy: Secondary | ICD-10-CM | POA: Diagnosis not present

## 2018-08-09 DIAGNOSIS — Z4801 Encounter for change or removal of surgical wound dressing: Secondary | ICD-10-CM | POA: Diagnosis not present

## 2018-08-09 DIAGNOSIS — O9852 Other viral diseases complicating childbirth: Secondary | ICD-10-CM | POA: Diagnosis not present

## 2018-08-09 DIAGNOSIS — E162 Hypoglycemia, unspecified: Secondary | ICD-10-CM | POA: Diagnosis not present

## 2018-08-09 DIAGNOSIS — E161 Other hypoglycemia: Secondary | ICD-10-CM | POA: Diagnosis not present

## 2018-08-09 DIAGNOSIS — O0930 Supervision of pregnancy with insufficient antenatal care, unspecified trimester: Secondary | ICD-10-CM

## 2018-08-09 LAB — CBC
HCT: 30.6 % — ABNORMAL LOW (ref 36.0–46.0)
Hemoglobin: 9.7 g/dL — ABNORMAL LOW (ref 12.0–15.0)
MCH: 26.1 pg (ref 26.0–34.0)
MCHC: 31.7 g/dL (ref 30.0–36.0)
MCV: 82.3 fL (ref 80.0–100.0)
Platelets: 346 10*3/uL (ref 150–400)
RBC: 3.72 MIL/uL — ABNORMAL LOW (ref 3.87–5.11)
RDW: 15.5 % (ref 11.5–15.5)
WBC: 7 10*3/uL (ref 4.0–10.5)
nRBC: 0 % (ref 0.0–0.2)

## 2018-08-09 LAB — SARS CORONAVIRUS 2 BY RT PCR (HOSPITAL ORDER, PERFORMED IN ~~LOC~~ HOSPITAL LAB): SARS Coronavirus 2: NEGATIVE

## 2018-08-09 LAB — COMPREHENSIVE METABOLIC PANEL
ALT: 10 U/L (ref 0–44)
AST: 28 U/L (ref 15–41)
Albumin: 2.3 g/dL — ABNORMAL LOW (ref 3.5–5.0)
Alkaline Phosphatase: 198 U/L — ABNORMAL HIGH (ref 38–126)
Anion gap: 11 (ref 5–15)
BUN: 6 mg/dL (ref 6–20)
CO2: 19 mmol/L — ABNORMAL LOW (ref 22–32)
Calcium: 8.6 mg/dL — ABNORMAL LOW (ref 8.9–10.3)
Chloride: 107 mmol/L (ref 98–111)
Creatinine, Ser: 0.79 mg/dL (ref 0.44–1.00)
GFR calc Af Amer: >60 mL/min (ref >60)
GFR calc non Af Amer: >60 mL/min (ref >60)
Glucose, Bld: 76 mg/dL (ref 70–99)
Potassium: 4.2 mmol/L (ref 3.5–5.1)
Sodium: 137 mmol/L (ref 135–145)
Total Bilirubin: 0.8 mg/dL (ref 0.3–1.2)
Total Protein: 5.6 g/dL — ABNORMAL LOW (ref 6.5–8.1)

## 2018-08-09 LAB — PREPARE RBC (CROSSMATCH)

## 2018-08-09 SURGERY — Surgical Case
Anesthesia: Spinal | Site: Abdomen | Wound class: Clean Contaminated

## 2018-08-09 MED ORDER — SUCCINYLCHOLINE CHLORIDE 20 MG/ML IJ SOLN
INTRAMUSCULAR | Status: DC | PRN
  Administered 2018-08-09: 40 mg via INTRAVENOUS

## 2018-08-09 MED ORDER — KETOROLAC TROMETHAMINE 30 MG/ML IJ SOLN
INTRAMUSCULAR | Status: AC
  Filled 2018-08-09: qty 1

## 2018-08-09 MED ORDER — STERILE WATER FOR IRRIGATION IR SOLN
Status: DC | PRN
  Administered 2018-08-09: 1

## 2018-08-09 MED ORDER — PROPOFOL 10 MG/ML IV BOLUS
INTRAVENOUS | Status: AC
  Filled 2018-08-09: qty 20

## 2018-08-09 MED ORDER — SOD CITRATE-CITRIC ACID 500-334 MG/5ML PO SOLN
30.0000 mL | ORAL | Status: AC
  Administered 2018-08-09: 30 mL via ORAL

## 2018-08-09 MED ORDER — NALBUPHINE HCL 10 MG/ML IJ SOLN: 5.0000 mg | INTRAMUSCULAR | Status: DC | PRN

## 2018-08-09 MED ORDER — ONDANSETRON HCL 4 MG/2ML IJ SOLN
INTRAMUSCULAR | Status: AC
  Filled 2018-08-09: qty 4

## 2018-08-09 MED ORDER — FENTANYL CITRATE (PF) 100 MCG/2ML IJ SOLN
INTRAMUSCULAR | Status: AC
  Filled 2018-08-09: qty 2

## 2018-08-09 MED ORDER — FENTANYL CITRATE (PF) 100 MCG/2ML IJ SOLN
INTRAMUSCULAR | Status: DC | PRN
  Administered 2018-08-09: 100 ug via EPIDURAL

## 2018-08-09 MED ORDER — GABAPENTIN 300 MG PO CAPS
ORAL_CAPSULE | ORAL | Status: AC
  Filled 2018-08-09: qty 1

## 2018-08-09 MED ORDER — TRANEXAMIC ACID-NACL 1000-0.7 MG/100ML-% IV SOLN
INTRAVENOUS | Status: AC
  Filled 2018-08-09: qty 100

## 2018-08-09 MED ORDER — NALBUPHINE HCL 10 MG/ML IJ SOLN: 5.0000 mg | Freq: Once | INTRAMUSCULAR | Status: DC | PRN

## 2018-08-09 MED ORDER — ONDANSETRON HCL 4 MG/2ML IJ SOLN
INTRAMUSCULAR | Status: AC
  Filled 2018-08-09: qty 2

## 2018-08-09 MED ORDER — OXYTOCIN 40 UNITS IN NORMAL SALINE INFUSION - SIMPLE MED
2.5000 [IU]/h | INTRAVENOUS | Status: AC
  Administered 2018-08-09 (×2): 2.5 [IU]/h via INTRAVENOUS

## 2018-08-09 MED ORDER — SENNOSIDES-DOCUSATE SODIUM 8.6-50 MG PO TABS
2.0000 | ORAL_TABLET | ORAL | Status: DC
  Administered 2018-08-09 – 2018-08-10 (×2): 2 via ORAL
  Filled 2018-08-09 (×2): qty 2

## 2018-08-09 MED ORDER — MORPHINE SULFATE (PF) 0.5 MG/ML IJ SOLN
INTRAMUSCULAR | Status: DC | PRN
  Administered 2018-08-09: 3 mg via EPIDURAL

## 2018-08-09 MED ORDER — MIDAZOLAM HCL 2 MG/2ML IJ SOLN
INTRAMUSCULAR | Status: DC | PRN
  Administered 2018-08-09: 2 mg via INTRAVENOUS

## 2018-08-09 MED ORDER — NALOXONE HCL 4 MG/10ML IJ SOLN
1.0000 ug/kg/h | INTRAVENOUS | Status: DC | PRN
  Filled 2018-08-09: qty 5

## 2018-08-09 MED ORDER — FENTANYL CITRATE (PF) 100 MCG/2ML IJ SOLN
25.0000 ug | INTRAMUSCULAR | Status: DC | PRN
  Administered 2018-08-09 (×3): 50 ug via INTRAVENOUS

## 2018-08-09 MED ORDER — OXYTOCIN 40 UNITS IN NORMAL SALINE INFUSION - SIMPLE MED
INTRAVENOUS | Status: AC
  Filled 2018-08-09: qty 1000

## 2018-08-09 MED ORDER — KETOROLAC TROMETHAMINE 30 MG/ML IJ SOLN
30.0000 mg | Freq: Once | INTRAMUSCULAR | Status: AC
  Administered 2018-08-09: 30 mg via INTRAVENOUS

## 2018-08-09 MED ORDER — SODIUM CHLORIDE 0.9 % IV SOLN
INTRAVENOUS | Status: DC | PRN
  Administered 2018-08-09: 18:00:00 via INTRAVENOUS

## 2018-08-09 MED ORDER — METHYLERGONOVINE MALEATE 0.2 MG PO TABS: 0.2000 mg | ORAL_TABLET | ORAL | Status: DC | PRN

## 2018-08-09 MED ORDER — TETANUS-DIPHTH-ACELL PERTUSSIS 5-2.5-18.5 LF-MCG/0.5 IM SUSP: 0.5000 mL | Freq: Once | INTRAMUSCULAR | Status: DC

## 2018-08-09 MED ORDER — LACTATED RINGERS IV SOLN
INTRAVENOUS | Status: DC
  Administered 2018-08-09 (×2): via INTRAVENOUS

## 2018-08-09 MED ORDER — PROMETHAZINE HCL 25 MG/ML IJ SOLN: 6.2500 mg | INTRAMUSCULAR | Status: DC | PRN

## 2018-08-09 MED ORDER — PHENYLEPHRINE HCL-NACL 20-0.9 MG/250ML-% IV SOLN
INTRAVENOUS | Status: AC
  Filled 2018-08-09: qty 250

## 2018-08-09 MED ORDER — KETAMINE HCL 10 MG/ML IJ SOLN
INTRAMUSCULAR | Status: DC | PRN
  Administered 2018-08-09: 30 mg via INTRAVENOUS
  Administered 2018-08-09: 20 mg via INTRAVENOUS

## 2018-08-09 MED ORDER — SODIUM CHLORIDE 0.9% FLUSH: 3.0000 mL | INTRAVENOUS | Status: DC | PRN

## 2018-08-09 MED ORDER — SODIUM CHLORIDE 0.9 % IV SOLN
2.0000 g | INTRAVENOUS | Status: AC
  Administered 2018-08-09: 2 g via INTRAVENOUS

## 2018-08-09 MED ORDER — SOD CITRATE-CITRIC ACID 500-334 MG/5ML PO SOLN
ORAL | Status: AC
  Filled 2018-08-09: qty 15

## 2018-08-09 MED ORDER — WITCH HAZEL-GLYCERIN EX PADS: 1.0000 "application " | MEDICATED_PAD | CUTANEOUS | Status: DC | PRN

## 2018-08-09 MED ORDER — ACETAMINOPHEN 500 MG PO TABS
1000.0000 mg | ORAL_TABLET | Freq: Four times a day (QID) | ORAL | Status: AC
  Administered 2018-08-09 – 2018-08-10 (×3): 1000 mg via ORAL
  Filled 2018-08-09 (×3): qty 2

## 2018-08-09 MED ORDER — SIMETHICONE 80 MG PO CHEW
80.0000 mg | CHEWABLE_TABLET | Freq: Three times a day (TID) | ORAL | Status: DC
  Administered 2018-08-10 – 2018-08-11 (×6): 80 mg via ORAL
  Filled 2018-08-09 (×7): qty 1

## 2018-08-09 MED ORDER — DIPHENHYDRAMINE HCL 25 MG PO CAPS: 25.0000 mg | ORAL_CAPSULE | Freq: Four times a day (QID) | ORAL | Status: DC | PRN

## 2018-08-09 MED ORDER — GABAPENTIN 100 MG PO CAPS
100.0000 mg | ORAL_CAPSULE | Freq: Three times a day (TID) | ORAL | Status: DC
  Administered 2018-08-09 – 2018-08-12 (×8): 100 mg via ORAL
  Filled 2018-08-09 (×8): qty 1

## 2018-08-09 MED ORDER — ONDANSETRON HCL 4 MG/2ML IJ SOLN: 4.0000 mg | Freq: Three times a day (TID) | INTRAMUSCULAR | Status: DC | PRN

## 2018-08-09 MED ORDER — METHYLERGONOVINE MALEATE 0.2 MG/ML IJ SOLN: 0.2000 mg | INTRAMUSCULAR | Status: DC | PRN

## 2018-08-09 MED ORDER — MORPHINE SULFATE (PF) 0.5 MG/ML IJ SOLN: INTRAMUSCULAR | Status: DC | PRN

## 2018-08-09 MED ORDER — FENTANYL CITRATE (PF) 100 MCG/2ML IJ SOLN: 50.0000 ug | INTRAMUSCULAR | Status: DC | PRN

## 2018-08-09 MED ORDER — DIBUCAINE (PERIANAL) 1 % EX OINT: 1.0000 "application " | TOPICAL_OINTMENT | CUTANEOUS | Status: DC | PRN

## 2018-08-09 MED ORDER — BUPIVACAINE IN DEXTROSE 0.75-8.25 % IT SOLN
INTRATHECAL | Status: AC
  Filled 2018-08-09: qty 2

## 2018-08-09 MED ORDER — KETOROLAC TROMETHAMINE 30 MG/ML IJ SOLN: 30.0000 mg | Freq: Four times a day (QID) | INTRAMUSCULAR | Status: AC | PRN

## 2018-08-09 MED ORDER — MORPHINE SULFATE (PF) 0.5 MG/ML IJ SOLN
INTRAMUSCULAR | Status: AC
  Filled 2018-08-09: qty 10

## 2018-08-09 MED ORDER — KETOROLAC TROMETHAMINE 30 MG/ML IJ SOLN
30.0000 mg | Freq: Four times a day (QID) | INTRAMUSCULAR | Status: AC
  Administered 2018-08-09 – 2018-08-10 (×3): 30 mg via INTRAVENOUS
  Filled 2018-08-09 (×4): qty 1

## 2018-08-09 MED ORDER — TRANEXAMIC ACID-NACL 1000-0.7 MG/100ML-% IV SOLN
1000.0000 mg | INTRAVENOUS | Status: AC
  Administered 2018-08-09: 17:00:00 1000 mg via INTRAVENOUS

## 2018-08-09 MED ORDER — PROPOFOL 10 MG/ML IV BOLUS
INTRAVENOUS | Status: DC | PRN
  Administered 2018-08-09: 100 mg via INTRAVENOUS

## 2018-08-09 MED ORDER — LIDOCAINE-EPINEPHRINE 2 %-1:100000 IJ SOLN
INTRAMUSCULAR | Status: DC | PRN
  Administered 2018-08-09 (×4): 5 mg

## 2018-08-09 MED ORDER — SODIUM CHLORIDE 0.9 % IR SOLN
Status: DC | PRN
  Administered 2018-08-09: 1

## 2018-08-09 MED ORDER — ACETAMINOPHEN 500 MG PO TABS: 1000.0000 mg | ORAL_TABLET | Freq: Once | ORAL | Status: DC

## 2018-08-09 MED ORDER — LEVONORGESTREL 19.5 MCG/DAY IU IUD
INTRAUTERINE_SYSTEM | Freq: Once | INTRAUTERINE | Status: AC
  Administered 2018-08-09: 18:00:00 via INTRAUTERINE

## 2018-08-09 MED ORDER — ACETAMINOPHEN 160 MG/5ML PO SOLN: 1000.0000 mg | Freq: Once | ORAL | Status: DC

## 2018-08-09 MED ORDER — LEVONORGESTREL 19.5 MCG/DAY IU IUD
INTRAUTERINE_SYSTEM | INTRAUTERINE | Status: AC
  Filled 2018-08-09: qty 1

## 2018-08-09 MED ORDER — LIDOCAINE HCL (PF) 1 % IJ SOLN
INTRAMUSCULAR | Status: AC
  Filled 2018-08-09: qty 5

## 2018-08-09 MED ORDER — SIMETHICONE 80 MG PO CHEW
80.0000 mg | CHEWABLE_TABLET | ORAL | Status: DC
  Administered 2018-08-10: 80 mg via ORAL
  Filled 2018-08-09 (×2): qty 1

## 2018-08-09 MED ORDER — PRENATAL MULTIVITAMIN CH
1.0000 | ORAL_TABLET | Freq: Every day | ORAL | Status: DC
  Administered 2018-08-10 – 2018-08-12 (×3): 1 via ORAL
  Filled 2018-08-09 (×4): qty 1

## 2018-08-09 MED ORDER — ONDANSETRON HCL 4 MG/2ML IJ SOLN
INTRAMUSCULAR | Status: DC | PRN
  Administered 2018-08-09 (×2): 4 mg via INTRAVENOUS

## 2018-08-09 MED ORDER — SUCCINYLCHOLINE CHLORIDE 200 MG/10ML IV SOSY
PREFILLED_SYRINGE | INTRAVENOUS | Status: AC
  Filled 2018-08-09: qty 10

## 2018-08-09 MED ORDER — NALOXONE HCL 0.4 MG/ML IJ SOLN: 0.4000 mg | INTRAMUSCULAR | Status: DC | PRN

## 2018-08-09 MED ORDER — TERBUTALINE SULFATE 1 MG/ML IJ SOLN
0.2500 mg | Freq: Once | INTRAMUSCULAR | Status: AC
  Administered 2018-08-09: 16:00:00 0.25 mg via SUBCUTANEOUS

## 2018-08-09 MED ORDER — ACETAMINOPHEN 500 MG PO TABS
ORAL_TABLET | ORAL | Status: AC
  Filled 2018-08-09: qty 2

## 2018-08-09 MED ORDER — MIDAZOLAM HCL 2 MG/2ML IJ SOLN
INTRAMUSCULAR | Status: AC
  Filled 2018-08-09: qty 2

## 2018-08-09 MED ORDER — ZOLPIDEM TARTRATE 5 MG PO TABS: 5.0000 mg | ORAL_TABLET | Freq: Every evening | ORAL | Status: DC | PRN

## 2018-08-09 MED ORDER — LIDOCAINE 2% (20 MG/ML) 5 ML SYRINGE
INTRAMUSCULAR | Status: AC
  Filled 2018-08-09: qty 5

## 2018-08-09 MED ORDER — TERBUTALINE SULFATE 1 MG/ML IJ SOLN
INTRAMUSCULAR | Status: AC
  Filled 2018-08-09: qty 1

## 2018-08-09 MED ORDER — SODIUM CHLORIDE 0.9 % IV SOLN
INTRAVENOUS | Status: AC
  Filled 2018-08-09: qty 2

## 2018-08-09 MED ORDER — SIMETHICONE 80 MG PO CHEW
80.0000 mg | CHEWABLE_TABLET | ORAL | Status: DC | PRN
  Administered 2018-08-09: 80 mg via ORAL

## 2018-08-09 MED ORDER — COCONUT OIL OIL: 1.0000 "application " | TOPICAL_OIL | Status: DC | PRN

## 2018-08-09 MED ORDER — MENTHOL 3 MG MT LOZG: 1.0000 | LOZENGE | OROMUCOSAL | Status: DC | PRN

## 2018-08-09 MED ORDER — LACTATED RINGERS IV SOLN
INTRAVENOUS | Status: DC
  Administered 2018-08-09: via INTRAVENOUS

## 2018-08-09 MED ORDER — DIPHENHYDRAMINE HCL 25 MG PO CAPS: 25.0000 mg | ORAL_CAPSULE | ORAL | Status: DC | PRN

## 2018-08-09 MED ORDER — KETAMINE HCL 50 MG/5ML IJ SOSY
PREFILLED_SYRINGE | INTRAMUSCULAR | Status: AC
  Filled 2018-08-09: qty 5

## 2018-08-09 MED ORDER — PHENYLEPHRINE HCL-NACL 20-0.9 MG/250ML-% IV SOLN
INTRAVENOUS | Status: DC | PRN
  Administered 2018-08-09: 60 ug/min via INTRAVENOUS

## 2018-08-09 MED ORDER — ENOXAPARIN SODIUM 40 MG/0.4ML ~~LOC~~ SOLN
40.0000 mg | SUBCUTANEOUS | Status: DC
  Administered 2018-08-10 – 2018-08-12 (×3): 40 mg via SUBCUTANEOUS
  Filled 2018-08-09 (×3): qty 0.4

## 2018-08-09 MED ORDER — OXYTOCIN 10 UNIT/ML IJ SOLN
INTRAMUSCULAR | Status: DC | PRN
  Administered 2018-08-09: 40 [IU]

## 2018-08-09 MED ORDER — DEXAMETHASONE SODIUM PHOSPHATE 10 MG/ML IJ SOLN
INTRAMUSCULAR | Status: AC
  Filled 2018-08-09: qty 1

## 2018-08-09 MED ORDER — DIPHENHYDRAMINE HCL 50 MG/ML IJ SOLN: 12.5000 mg | INTRAMUSCULAR | Status: DC | PRN

## 2018-08-09 MED ORDER — IBUPROFEN 800 MG PO TABS
800.0000 mg | ORAL_TABLET | Freq: Four times a day (QID) | ORAL | Status: DC
  Administered 2018-08-10 – 2018-08-12 (×8): 800 mg via ORAL
  Filled 2018-08-09 (×9): qty 1

## 2018-08-09 MED ORDER — ACETAMINOPHEN 500 MG PO TABS
1000.0000 mg | ORAL_TABLET | ORAL | Status: AC
  Administered 2018-08-09: 1000 mg via ORAL

## 2018-08-09 SURGICAL SUPPLY — 41 items
CHLORAPREP W/TINT 26ML (MISCELLANEOUS) ×3 IMPLANT
CLAMP CORD UMBIL (MISCELLANEOUS) IMPLANT
CLOTH BEACON ORANGE TIMEOUT ST (SAFETY) ×3 IMPLANT
DERMABOND ADVANCED (GAUZE/BANDAGES/DRESSINGS) ×2
DERMABOND ADVANCED .7 DNX12 (GAUZE/BANDAGES/DRESSINGS) ×1 IMPLANT
DRSG OPSITE POSTOP 4X10 (GAUZE/BANDAGES/DRESSINGS) ×3 IMPLANT
ELECT REM PT RETURN 9FT ADLT (ELECTROSURGICAL) ×3
ELECTRODE REM PT RTRN 9FT ADLT (ELECTROSURGICAL) ×1 IMPLANT
EXTRACTOR VACUUM BELL STYLE (SUCTIONS) IMPLANT
GLOVE BIOGEL PI IND STRL 7.0 (GLOVE) ×1 IMPLANT
GLOVE BIOGEL PI IND STRL 8 (GLOVE) ×1 IMPLANT
GLOVE BIOGEL PI INDICATOR 7.0 (GLOVE) ×2
GLOVE BIOGEL PI INDICATOR 8 (GLOVE) ×2
GLOVE ECLIPSE 8.0 STRL XLNG CF (GLOVE) ×3 IMPLANT
GOWN STRL REUS W/TWL LRG LVL3 (GOWN DISPOSABLE) ×6 IMPLANT
KIT ABG SYR 3ML LUER SLIP (SYRINGE) ×3 IMPLANT
NDL SAFETY ECLIPSE 18X1.5 (NEEDLE) ×1 IMPLANT
NEEDLE HYPO 18GX1.5 BLUNT FILL (NEEDLE) ×3 IMPLANT
NEEDLE HYPO 18GX1.5 SHARP (NEEDLE) ×2
NEEDLE HYPO 22GX1.5 SAFETY (NEEDLE) ×3 IMPLANT
NEEDLE HYPO 25X5/8 SAFETYGLIDE (NEEDLE) ×3 IMPLANT
NS IRRIG 1000ML POUR BTL (IV SOLUTION) ×3 IMPLANT
PACK C SECTION WH (CUSTOM PROCEDURE TRAY) ×3 IMPLANT
PAD OB MATERNITY 4.3X12.25 (PERSONAL CARE ITEMS) ×3 IMPLANT
PENCIL SMOKE EVAC W/HOLSTER (ELECTROSURGICAL) ×3 IMPLANT
RTRCTR C-SECT PINK 25CM LRG (MISCELLANEOUS) IMPLANT
STAPLER VISISTAT 35W (STAPLE) ×3 IMPLANT
SUT CHROMIC 0 CT 1 (SUTURE) ×3 IMPLANT
SUT MNCRL 0 VIOLET CTX 36 (SUTURE) ×4 IMPLANT
SUT MONOCRYL 0 CTX 36 (SUTURE) ×8
SUT PLAIN 2 0 (SUTURE)
SUT PLAIN 2 0 XLH (SUTURE) IMPLANT
SUT PLAIN ABS 2-0 CT1 27XMFL (SUTURE) IMPLANT
SUT VIC AB 0 CTX 36 (SUTURE) ×4
SUT VIC AB 0 CTX36XBRD ANBCTRL (SUTURE) ×2 IMPLANT
SUT VIC AB 4-0 KS 27 (SUTURE) IMPLANT
SYR 20CC LL (SYRINGE) ×12 IMPLANT
SYR 30ML LL (SYRINGE) ×3 IMPLANT
TOWEL OR 17X24 6PK STRL BLUE (TOWEL DISPOSABLE) ×3 IMPLANT
TRAY FOLEY W/BAG SLVR 14FR LF (SET/KITS/TRAYS/PACK) IMPLANT
WATER STERILE IRR 1000ML POUR (IV SOLUTION) ×3 IMPLANT

## 2018-08-09 NOTE — H&P (Signed)
Obstetric Preoperative History and Physical  Sarah Schmitt is a 23 y.o. [phone] with dichorionic, diamniotic twin IUP at [redacted]w[redacted]d presenting for scheduled repeat cesarean section and IUD placement. Patient had initially been scheduled last week, but this was postponed given incidental finding of COVID positive test on her preoperative testing, she had no symptoms.  Since then, she has not been compliant with quarantine restrictions, but had no respiratory symptoms.  Reports good fetal movement, no bleeding, no leaking of fluid.  She does report having painful frequent contractions every 2-3 minutes, wants intervention for this. No other acute preoperative concerns.    Cesarean Section Indication: Previous uterine incision LTCS x 3  Prenatal Course Source of Care: [facility] Pregnancy complications or risks: Patient Active Problem List   Diagnosis Date Noted  . COVID-19 virus infection 08/01/2018  . Twin gestation, dichorionic diamniotic 04/05/2018  . Supervision of high-risk pregnancy 03/04/2018  . Anemia during pregnancy 05/28/2017  . Late prenatal care 04/03/2016  . History of cesarean delivery x 3 affecting pregnancy 03/14/2016   Nursing Staff Provider  Office Location  [facility] Dating  LMP   Language   ENGLISH Anatomy US  Normal  Flu Vaccine   Declined 04/05/2018 Genetic Screen  NIPS:low risk female/female AFP: Negative   TDaP vaccine   Declined 5/18 GTT Third trimester 1 hr GTT  Rhogam  NA   LAB RESULTS   Feeding Plan  breastfeed Blood Type O/Positive/-- (03/06 1005)   Contraception  Mirena Antibody Negative (03/06 1005)  Circumcision  yes Rubella 1.25 (03/06 1005)  Pediatrician   [facility] RPR Non Reactive (06/01 1744)   Support Person  FOB-Dayron HBsAg Negative (03/06 1005)   Prenatal Classes  No HIV Non Reactive (06/01 1744)  BTL Consent Declined 5/18 GBS Positive in initial urine culture   VBAC Consent NA- repeat C/S Pap Normal 2019    Hgb Electro  Normal  Blood  Pressure cuff Yes CF Neg    SMA 3    Past Medical History:  Diagnosis Date  . Anemia   . COVID-19 virus infection 08/01/2018   07/26/18  . Dyspnea   . Hypotension     Past Surgical History:  Procedure Laterality Date  . CESAREAN SECTION    . CESAREAN SECTION N/A 08/28/2016   Procedure: CESAREAN SECTION;  Surgeon: Woodroe Mode, MD;  Location: [place];  Service: Obstetrics;  Laterality: N/A;  . CESAREAN SECTION N/A 10/02/2017   Procedure: REPEAT CESAREAN SECTION;  Surgeon: Mora Bellman, MD;  Location: [place];  Service: Obstetrics;  Laterality: N/A;    OB History  Gravida Para Term Preterm AB Living  4 3 3    0 3  SAB TAB Ectopic Multiple Live Births  0 0   0 3    # Outcome Date GA Lbr Len/2nd Weight Sex Delivery Anes PTL Lv  4 Current           3 Term 10/02/17 [redacted]w[redacted]d  3005 g F CS-Vac Spinal  LIV  2 Term 08/28/16 [redacted]w[redacted]d  3130 g M CS-Vac Spinal  LIV     Birth Comments: arrhythmia  1 Term 07/27/10    M CS-LTranv  N LIV     Birth Comments: Breech    Social History   Socioeconomic History  . Marital status: Single    Spouse name: Not on file  . Number of children: Not on file  . Years of education: Not on file  . Highest education level: Not  on file  Occupational History  . Not on file  Social Needs  . Financial resource strain: Not hard at all  . Food insecurity    Worry: Never true    Inability: Never true  . Transportation needs    Medical: Yes    Non-medical: Yes  Tobacco Use  . Smoking status: Former Smoker    Types: Cigarettes    Quit date: 12/11/2017    Years since quitting: 0.6  . Smokeless tobacco: Never Used  Substance and Sexual Activity  . Alcohol use: No  . Drug use: No  . Sexual activity: Yes    Partners: Male    Birth control/protection: None  Lifestyle  . Physical activity    Days per week: 0 days    Minutes per session: 0 min  . Stress: Not at all  Relationships  . Social Musicianconnections    Talks on phone: Not on file     Gets together: Not on file    Attends religious service: Not on file    Active member of club or organization: Not on file    Attends meetings of clubs or organizations: Not on file    Relationship status: Not on file  Other Topics Concern  . Not on file  Social History Narrative  . Not on file    Family History  Problem Relation Age of Onset  . Diabetes Father   . Hypertension Maternal Grandmother   . Diabetes Maternal Grandmother   . Diabetes Paternal Grandmother   . Hypertension Paternal Grandmother   . Diabetes Mother     Medications Prior to Admission  Medication Sig Dispense Refill Last Dose  . cyclobenzaprine (FLEXERIL) 5 MG tablet Take 5 mg by mouth 3 (three) times daily as needed for muscle spasms.     . Prenatal Vit-Fe Fumarate-FA (MULTIVITAMIN-PRENATAL) 27-0.8 MG TABS tablet Take 1 tablet by mouth daily at 12 noon.   08/08/2018 at Unknown time    No Known Allergies  Review of Systems: Pertinent items noted in HPI and remainder of comprehensive ROS otherwise negative.  Physical Exam: BP 132/80   Pulse 80   Temp 98.5 F (36.9 C) (Oral)   Resp 18   LMP 11/10/2017  FHR tracing: A 130 bpm  B 125 bpm  For both: +Moderate variability, + Accelerations and no Decelerations  Tocometry: q 2-3 minutes contractions. CONSTITUTIONAL: Well-developed, well-nourished female in no acute distress.  HENT:  Normocephalic, atraumatic, External right and left ear normal. Oropharynx is clear and moist EYES: Conjunctivae and EOM are normal. Pupils are equal, round, and reactive to light. No scleral icterus.  NECK: Normal range of motion, supple, no masses SKIN: Skin is warm and dry. No rash noted. Not diaphoretic. No erythema. No pallor. NEUROLGIC: Alert and oriented to person, place, and time. Normal reflexes, muscle tone coordination. No cranial nerve deficit noted. PSYCHIATRIC: Normal mood and affect. Normal behavior. Normal judgment and thought content. CARDIOVASCULAR: Normal  heart rate noted, regular rhythm RESPIRATORY: Effort and breath sounds normal, no problems with respiration noted ABDOMEN: Soft, nontender, nondistended, gravid. Well-healed Pfannenstiel incisions. PELVIC: Deferred MUSCULOSKELETAL: Normal range of motion. No edema and no tenderness. 2+ distal pulses.  Pertinent Labs/Studies:   Results for orders placed or performed during the hospital encounter of 08/09/18 (from the past 504 hour(s))  SARS Coronavirus 2 [facility](Hospital order, Performed in [facility] hospital lab)   Collection Time: 08/09/18  2:48 PM  Result Value Ref Range   SARS Coronavirus 2 NEGATIVE NEGATIVE  CBC   Collection Time: 08/09/18  3:00 PM  Result Value Ref Range   WBC 7.0 4.0 - 10.5 K/uL   RBC 3.72 (L) 3.87 - 5.11 MIL/uL   Hemoglobin 9.7 (L) 12.0 - 15.0 g/dL   HCT 16.130.6 (L) 09.636.0 - 04.546.0 %   MCV 82.3 80.0 - 100.0 fL   MCH 26.1 26.0 - 34.0 pg   MCHC 31.7 30.0 - 36.0 g/dL   RDW 40.915.5 81.111.5 - 91.415.5 %   Platelets 346 150 - 400 K/uL   nRBC 0.0 0.0 - 0.2 %  Comprehensive metabolic panel   Collection Time: 08/09/18  3:00 PM  Result Value Ref Range   Sodium 137 135 - 145 mmol/L   Potassium 4.2 3.5 - 5.1 mmol/L   Chloride 107 98 - 111 mmol/L   CO2 19 (L) 22 - 32 mmol/L   Glucose, Bld 76 70 - 99 mg/dL   BUN 6 6 - 20 mg/dL   Creatinine, Ser 7.820.79 0.44 - 1.00 mg/dL   Calcium 8.6 (L) 8.9 - 10.3 mg/dL   Total Protein 5.6 (L) 6.5 - 8.1 g/dL   Albumin 2.3 (L) 3.5 - 5.0 g/dL   AST 28 15 - 41 U/L   ALT 10 0 - 44 U/L   Alkaline Phosphatase 198 (H) 38 - 126 U/L   Total Bilirubin 0.8 0.3 - 1.2 mg/dL   GFR calc non Af Amer >60 >60 mL/min   GFR calc Af Amer >60 >60 mL/min   Anion gap 11 5 - 15  Prepare RBC (crossmatch)   Collection Time: 08/09/18  3:36 PM  Result Value Ref Range   Order Confirmation      ORDER PROCESSED BY BLOOD BANK Performed at [facility], [address] [place], [place] [address]   Type and screen   Collection Time: 08/09/18  3:36 PM  Result Value Ref  Range   ABO/RH(D) O POS    Antibody Screen NEG    Sample Expiration 08/12/2018,2359    Unit Number [id number]    Blood Component Type RBC LR PHER1    Unit division 00    Status of Unit ISSUED    Transfusion Status OK TO TRANSFUSE    Crossmatch Result      Compatible Performed at [facility], [address] [place], [place] [address]    Unit Number [id number]    Blood Component Type RBC LR PHER1    Unit division 00    Status of Unit ISSUED    Transfusion Status OK TO TRANSFUSE    Crossmatch Result Compatible   ABO/Rh   Collection Time: 08/09/18  3:36 PM  Result Value Ref Range   ABO/RH(D)      O POS Performed at [facility], [address] [place], [place] [address]   BP[facility]   Collection Time: 08/09/18  3:36 PM  Result Value Ref Range   ISSUE DATE / TIME [id number]    Blood Product Unit Number [id number]    PRODUCT CODE [id number]    Unit Type and Rh 5100    Blood Product Expiration Date [id number]    ISSUE DATE / TIME [id number]    Blood Product Unit Number [id number]    PRODUCT CODE [id number]    Unit Type and Rh 5100    Blood Product Expiration Date [id number]   Results for orders placed or performed during the hospital encounter of 07/26/18 (from the past 504 hour(s))  Novel Coronavirus,NAA,(SEND-OUT TO REF  LAB - TAT 24-48 hrs); Hosp Order   Collection Time: 07/26/18  9:35 PM   Specimen: Nasopharyngeal Swab; Respiratory  Result Value Ref Range   SARS-CoV-2, NAA DETECTED (A) NOT DETECTED   Coronavirus Source NASOPHARYNGEAL     Assessment and Plan: Sarah Schmitt is a 23 y.o. [phone] at 5510w6d being admitted for scheduled repeat cesarean section and IUD placement; recently diagnosed with COVID on 07/26/18 without respiratory symptoms. The risks of cesarean section discussed with the patient included but were not limited to: bleeding which may require transfusion or reoperation; infection which may require antibiotics; injury to  bowel, bladder, ureters or other surrounding organs; injury to the fetus; need for additional procedures including hysterectomy in the event of a life-threatening hemorrhage; placental abnormalities wth subsequent pregnancies, incisional problems, thromboembolic phenomenon and other postoperative/anesthesia complications.   For the IUD, discussed risks of irregular bleeding, cramping, infection, malpositioning or misplacement of the IUD outside the uterus which may require further procedures, risk of ectopic pregnancy if pregnancy occurs despite >99% contraceptive efficacy rate.  The patient concurred with the proposed plan, giving informed written consent for the procedures. Patient has been NPO since last night she will remain NPO for procedure. Anesthesia and OR aware. Preoperative prophylactic antibiotics and SCDs ordered on call to the OR. To OR when ready.   Of note, patient was given a dose of terbutaline for her contractions while in the preoperative area.  Also her COVID test today was negative, will repeat in 24 hours. If negative x 2, droplet precautions will be lifted as per Infectious Diseases.    Jaynie CollinsUGONNA  Paxton Kanaan, MD, FACOG Obstetrician & Gynecologist, [facility], [facility]

## 2018-08-09 NOTE — Anesthesia Procedure Notes (Addendum)
Procedure Name: Intubation Date/Time: 08/09/2018 6:43 PM Performed by: Vista Deck, CRNA Pre-anesthesia Checklist: Patient identified, Emergency Drugs available, Suction available, Patient being monitored and Timeout performed Patient Re-evaluated:Patient Re-evaluated prior to induction Oxygen Delivery Method: Circle system utilized Preoxygenation: Pre-oxygenation with 100% oxygen Induction Type: IV induction Laryngoscope Size: Glidescope and 3 Grade View: Grade I Tube type: Oral Number of attempts: 1 Airway Equipment and Method: Oral airway Secured at: 21 cm Tube secured with: Tape Dental Injury: Teeth and Oropharynx as per pre-operative assessment

## 2018-08-09 NOTE — Transfer of Care (Signed)
Immediate Anesthesia Transfer of Care Note  Patient: Sarah Schmitt  Procedure(s) Performed: CESAREAN SECTION MULTI-GESTATIONAL and IUD Insert (N/A Abdomen)  Patient Location: PACU  Anesthesia Type:General and Epidural  Level of Consciousness: awake, alert  and patient cooperative  Airway & Oxygen Therapy: Patient Spontanous Breathing and non-rebreather face mask  Post-op Assessment: Report given to RN and Post -op Vital signs reviewed and stable  Post vital signs: Reviewed and stable  Last Vitals:  Vitals Value Taken Time  BP  125/52  Temp    Pulse 93 08/09/18 1918  Resp 26 08/09/18 1918  SpO2 100 % 08/09/18 1918  Vitals shown include unvalidated device data.  Last Pain:  Vitals:   08/09/18 1651  TempSrc:   PainSc: 6          Complications: No apparent anesthesia complications

## 2018-08-09 NOTE — Anesthesia Postprocedure Evaluation (Signed)
Anesthesia Post Note  Patient: CYRA SPADER  Procedure(s) Performed: CESAREAN SECTION MULTI-GESTATIONAL and IUD Insert (N/A Abdomen)     Patient location during evaluation: PACU Anesthesia Type: General Level of consciousness: awake and alert Pain management: pain level controlled Vital Signs Assessment: post-procedure vital signs reviewed and stable Respiratory status: spontaneous breathing, nonlabored ventilation and respiratory function stable Cardiovascular status: blood pressure returned to baseline and stable Postop Assessment: no apparent nausea or vomiting Anesthetic complications: no    Last Vitals:  Vitals:   08/09/18 2015 08/09/18 2030  BP: 139/65 135/88  Pulse: (!) 58 60  Resp: 12 15  Temp:  (!) 36.4 C  SpO2:      Last Pain:  Vitals:   08/09/18 2030  TempSrc: Oral  PainSc: 4    Pain Goal:                Epidural/Spinal Function Cutaneous sensation: Normal sensation (08/09/18 2015), Patient able to flex knees: Yes (08/09/18 2015), Patient able to lift hips off bed: Yes (08/09/18 2015), Back pain beyond tenderness at insertion site: No (08/09/18 2015), Progressively worsening motor and/or sensory loss: No (08/09/18 2015), Bowel and/or bladder incontinence post epidural: No (08/09/18 2015)  Brennan Bailey

## 2018-08-09 NOTE — Anesthesia Procedure Notes (Addendum)
Epidural Patient location during procedure: OR Start time: 08/09/2018 5:20 PM End time: 08/09/2018 5:28 PM  Staffing Anesthesiologist: Barnet Glasgow, MD  Preanesthetic Checklist Completed: patient identified, site marked and IV checked  Epidural Patient position: sitting Prep: DuraPrep Patient monitoring: cardiac monitor, continuous pulse ox, blood pressure and heart rate Approach: midline Location: L3-L4 Injection technique: LOR air  Needle:  Needle type: Tuohy  Needle insertion depth: 7 cm Catheter at skin depth: 13 cm  Assessment Sensory level: T6  Additional Notes 2 Attempt at CSE abandoned as patient was moving

## 2018-08-09 NOTE — Telephone Encounter (Signed)
Discussed remaining NPO until surgery today.  Verbalized understanding

## 2018-08-09 NOTE — Op Note (Signed)
Preoperative diagnosis:  1.  Intrauterine pregnancy at 38 weeks and 6 days  gestation                                         2.  Di-di-twin pregnancy                                         3.  Previous C-section x4                                         4.  COVID-19 positive   Postoperative diagnosis:  Same as above plus severe abdominal pelvic adhesions  Procedure: Repeat cesarean section, high transverse uterine incision                         Placement of intrauterine device after delivery of placenta  Surgeon:  Florian Buff MD  Assistant:    Anesthesia: Spinal plus general endotracheal anesthesia  Findings: This patient had a very small window of her uterus through which to deliver her twins. The bladder and you uterus were densely adherent to the anterior abdominal wall and there were adhesions in the fundus of the uterus as well.  Essentially I had to do a high transverse cesarean section because that was the only window in the uterus I had to deliver the twins in order to order to avoid injury to the bladder.  From the time I made the peritoneal incision to the time I could actually do the uterine incision was about 10 minutes because of patient's and adequate anesthesia.  They use inhalational and added IV medications in order to obtain an adequate amount of anesthesia do the surgery but the patient was still very active moving and it was an extremely difficult case today.  They finally did a general endotracheal anesthetic after I had the uterus closed essentially.   Over a high transverse uterine incision was delivered a viable female twin A at 36 with Apgars of 9 and 9 weighing 5 pounds and 11 ounces.  Twin B was a viable female and he was delivered at 1812 with Apgars of 8 and 9 weighing 5 pounds 13 ounces.  Description of operation:  Patient was taken to the operating room and placed in the sitting position where she underwent a spinal anesthetic. She was then placed in the  supine position with tilt to the left side. When adequate anesthetic level was obtained she was prepped and draped in usual sterile fashion and a Foley catheter was placed. A Pfannenstiel skin incision was made and carried down sharply to the rectus fascia which was scored in the midline extended laterally. The fascia was taken off the muscles both superiorly and without difficulty. The muscles were divided.  The peritoneal cavity was entered.  At this point the patient was so uncomfortable and moving so much it took about 4 people to hold her still I was very concerned about breakdown of the sterile field at this point .  I waited probably 10 minutes for anesthesia to give IV and inhalational agents in order to be able to proceed safely without the patient undergoing excessive movement .  I had to do a modified churning incision of both rectus muscles in order to gain a large enough window through which to deliver the babies.   I then had to make a high transverse uterine incision because the bladder was densely adherent to the anterior abdominal wall and lower uterine segment very high on the uterus .  This was made quite difficult with the an adequate anesthetic at this point .  A high transverse uterine incision was made and the delivery details are as noted above in the findings .  The delivery team was present for the delivery and the baby's underwent routine native neonatal resuscitation .  I mainly deliver the placenta.  I could not exteriorize the uterus because of the adhesions.  I then placed a Liletta IUD in the uterus cutting the strings at 8 cm.  This was taken out of the sheath and placed at the top of the uterine fundus manually and kept in place while I was closing the uterus.   I then closed the uterus in 2 layers the first being a running interlocking layer and the second being imbricating layer .  Because I was so concerned about the adhesions of the bladder to the lower uterine segment I  instilled 300 cc of sterile milk retrograde up the Foley into the bladder to make sure that there was no bladder injury .  Indeed there was no egress of milk from the urinary bladder. Peritoneal cavity was irrigated vigorously. The muscles and peritoneum were reapproximated loosely. The fascia was closed using 0 Vicryl in running fashion. Subcutaneous tissue was made hemostatic and irrigated. The skin was closed using skin staples because the scar tissue of the lower portion of the incision was so great I did not feel comfortable with the integrity of the incision using a subcuticular closure.  Actually used the electrocautery to undermine the lower portion of the incision to free it from the underlying scar tissue in order to reapproximated even with staples.  Blood loss for the procedure was 650 cc. The patient received 2 gram of Ancef prophylactically. The patient was taken to the recovery room in good stable condition with all counts being correct x3.  EBL 650 cc  Lazaro ArmsLuther H Bryceton Hantz 08/09/2018 7:26 PM

## 2018-08-09 NOTE — Consult Note (Signed)
Neonatology Note:   Attendance at C-section:    I was asked by Dr. Elonda Husky to attend this repeat C/S at 66 6/7 weeks due to twin gestation. The mother is a G4P3 O pos, GBS positive with di-di twins, late [facility], and recent COVID infection. She had upper respiratory symptoms and tested positive for COVID on 6/26. Her symptoms resolved and she tested negative for COVID today. Patient had epidural anesthesia, then required general part way through C-section.  Twin A, a girl, had ROM at delivery, fluid clear. Vacuum-assisted delivery. Infant vigorous with good spontaneous cry and tone. Delayed cord clamping was not done. Needed only minimal bulb suctioning. Ap 9/9, assigned by delivery team. Due to need to conserve PPE during COVID pandemic, I was outside of the delivery room, immediately available, and assessed this infant through a glass observation window. Infant is able to remain with her father for skin to skin time under nursing supervision. Transferred to the care of Pediatrician.  Twin B, a boy, had ROM at delivery, fluid with thin meconium. Delivered double footling breech. Infant vigorous with good spontaneous cry and tone. Delayed cord clamping was not done. Needed only minimal bulb suctioning and BBO2 for about 1 minute to help pink up. Ap 8/9, assigned by delivery team. Due to need to conserve PPE during COVID pandemic, I was outside of the delivery room, immediately available, and assessed this infant through a glass observation window. Infant is able to remain with his father for skin to skin time under nursing supervision. Transferred to the care of Pediatrician.    Real Cons, MD

## 2018-08-09 NOTE — Anesthesia Preprocedure Evaluation (Addendum)
Anesthesia Evaluation  Patient identified by MRN, date of birth, ID band Patient awake    Reviewed: Allergy & Precautions, NPO status , Patient's Chart, lab work & pertinent test results  Airway Mallampati: II  TM Distance: >3 FB Neck ROM: Full    Dental no notable dental hx. (+) Teeth Intact   Pulmonary shortness of breath, former smoker,  PT covid pos 2 weeks ago w/o quarantine or 2 neg  covid tests within 24 hours   Pulmonary exam normal breath sounds clear to auscultation       Cardiovascular Exercise Tolerance: Good negative cardio ROS Normal cardiovascular exam Rhythm:Regular Rate:Normal     Neuro/Psych negative neurological ROS  negative psych ROS   GI/Hepatic negative GI ROS, Neg liver ROS,   Endo/Other  negative endocrine ROS  Renal/GU negative Renal ROS     Musculoskeletal   Abdominal   Peds  Hematology  (+) Blood dyscrasia, anemia ,   Anesthesia Other Findings   Reproductive/Obstetrics (+) Pregnancy Twin Gestation                            Lab Results  Component Value Date   CREATININE 0.79 08/09/2018   BUN 6 08/09/2018   NA 137 08/09/2018   K 4.2 08/09/2018   CL 107 08/09/2018   CO2 19 (L) 08/09/2018  ; Lab Results  Component Value Date   WBC 7.0 08/09/2018   HGB 9.7 (L) 08/09/2018   HCT 30.6 (L) 08/09/2018   MCV 82.3 08/09/2018   PLT 346 08/09/2018    Anesthesia Physical Anesthesia Plan  ASA: III  Anesthesia Plan: Spinal   Post-op Pain Management:    Induction: Intravenous  PONV Risk Score and Plan: Treatment may vary due to age or medical condition, Dexamethasone and Ondansetron  Airway Management Planned: Simple Face Mask and Natural Airway  Additional Equipment:   Intra-op Plan:   Post-operative Plan:   Informed Consent: I have reviewed the patients History and Physical, chart, labs and discussed the procedure including the risks, benefits  and alternatives for the proposed anesthesia with the patient or authorized representative who has indicated his/her understanding and acceptance.     Dental advisory given  Plan Discussed with: CRNA  Anesthesia Plan Comments:         Anesthesia Quick Evaluation

## 2018-08-10 ENCOUNTER — Encounter (HOSPITAL_COMMUNITY): Payer: Self-pay

## 2018-08-10 LAB — ABO/RH: ABO/RH(D): O POS

## 2018-08-10 LAB — CBC
HCT: 26 % — ABNORMAL LOW (ref 36.0–46.0)
Hemoglobin: 8.2 g/dL — ABNORMAL LOW (ref 12.0–15.0)
MCH: 26.1 pg (ref 26.0–34.0)
MCHC: 31.5 g/dL (ref 30.0–36.0)
MCV: 82.8 fL (ref 80.0–100.0)
Platelets: 274 10*3/uL (ref 150–400)
RBC: 3.14 MIL/uL — ABNORMAL LOW (ref 3.87–5.11)
RDW: 15.2 % (ref 11.5–15.5)
WBC: 21.8 10*3/uL — ABNORMAL HIGH (ref 4.0–10.5)
nRBC: 0 % (ref 0.0–0.2)

## 2018-08-10 LAB — RPR: RPR Ser Ql: NONREACTIVE

## 2018-08-10 LAB — SARS CORONAVIRUS 2 BY RT PCR (HOSPITAL ORDER, PERFORMED IN ~~LOC~~ HOSPITAL LAB): SARS Coronavirus 2: NEGATIVE

## 2018-08-10 MED ORDER — OXYCODONE HCL 5 MG PO TABS
5.0000 mg | ORAL_TABLET | ORAL | Status: DC | PRN
  Administered 2018-08-10 – 2018-08-12 (×9): 10 mg via ORAL
  Filled 2018-08-10 (×9): qty 2

## 2018-08-10 MED ORDER — POLYSACCHARIDE IRON COMPLEX 150 MG PO CAPS
150.0000 mg | ORAL_CAPSULE | Freq: Every day | ORAL | Status: DC
  Administered 2018-08-11 – 2018-08-12 (×2): 150 mg via ORAL
  Filled 2018-08-10 (×2): qty 1

## 2018-08-10 MED ORDER — POLYETHYLENE GLYCOL 3350 17 G PO PACK
17.0000 g | PACK | Freq: Every day | ORAL | Status: DC
  Administered 2018-08-11: 17 g via ORAL
  Filled 2018-08-10: qty 1

## 2018-08-10 NOTE — Lactation Note (Signed)
This note was copied from a baby's chart. Lactation Consultation Note:  P5, infant is 57 hours old . [place] phoned mother in her room.  Mother reports that she feels that it will be too much for her to breastfeed or pump with twins. Mother reports that her youngest child is 70 months with next child over 1 yr. She reports that she plans to only bottle feed.  Mother denies having any questions or concerns for LC.   Patient Name: Girl Jourdin Gens [id number] Date: 08/10/2018 Reason for consult: Initial assessment   Maternal Data Does the patient have breastfeeding experience prior to this delivery?: Yes  Feeding Feeding Type: Bottle Fed - Formula Nipple Type: Slow - flow  LATCH Score                   Interventions    Lactation Tools Discussed/Used     Consult Status Consult Status: Complete    Darla Lesches 08/10/2018, 4:29 PM

## 2018-08-10 NOTE — Progress Notes (Signed)
Daily Postpartum Note  Admission Date: 08/09/2018 Current Date: 08/10/2018 12:15 PM  Sarah Schmitt is a 23 y.o. [id number] POD#1 s/p rLTCS and Liletta IUD placement (EBL 965mL), admitted for scheduled rpt c/s. Patient had to get GETA during c/s due to inadequate pain control.   Pregnancy complicated by: Patient Active Problem List   Diagnosis Date Noted  . S/P cesarean section 08/09/2018  . COVID-19 virus infection 08/01/2018  . Twin gestation, dichorionic diamniotic 04/05/2018  . Supervision of high-risk pregnancy 03/04/2018  . Anemia during pregnancy 05/28/2017  . Late prenatal care 04/03/2016  . History of cesarean delivery x 3 affecting pregnancy 03/14/2016    Overnight/24hr events:  none  Subjective:  Meeting all post op goals/postpartum goals including flatus. No s/s of pre-eclampsia or pulm s/s  Objective:    Current Vital Signs 24h Vital Sign Ranges  T 98.1 F (36.7 C) Temp  Avg: 97.8 F (36.6 C)  Min: 96.7 F (35.9 C)  Max: 98.5 F (36.9 C)  BP (!) 134/54 BP  Min: 112/74  Max: 149/82  HR (!) 57 Pulse  Avg: 73.1  Min: 55  Max: 95  RR 17 Resp  Avg: 17.1  Min: 11  Max: 30  SaO2 95 % Room Air SpO2  Avg: 97.8 %  Min: 95 %  Max: 100 %       24 Hour I/O Current Shift I/O  Time Ins Outs 07/10 0701 - 07/11 0700 In: 2193.8 [I.V.:2193.8] Out: 0626 [Urine:700] 07/11 0701 - 07/11 1900 In: -  Out: 500 [Urine:500]   Patient Vitals for the past 24 hrs:  BP Temp Temp src Pulse Resp SpO2 Height Weight  08/10/18 0836 (!) 134/54 98.1 F (36.7 C) Oral (!) 57 17 95 % - -  08/10/18 0405 (!) 125/96 98.3 F (36.8 C) Oral 85 17 96 % - -  08/10/18 0219 135/76 - - 74 16 97 % - -  08/09/18 2239 130/77 98.1 F (36.7 C) Oral 66 - - - -  08/09/18 2216 - - - - - 97 % - -  08/09/18 2211 - - - - - 97 % - -  08/09/18 2206 - - - - - 98 % - -  08/09/18 2201 - - - - - 99 % - -  08/09/18 2156 - - - - - 96 % - -  08/09/18 2151 - - - - - 99 % - -  08/09/18 2146 - - - - - 99 % - -   08/09/18 2141 - - - - - 99 % - -  08/09/18 2136 - - - - - 96 % - -  08/09/18 2131 - - - - - 97 % - -  08/09/18 2126 - - - - - 98 % - -  08/09/18 2121 - - - - - 98 % - -  08/09/18 2105 (!) 149/82 97.7 F (36.5 C) Oral 82 16 - - -  08/09/18 2045 (!) 145/93 - - (!) 55 - 97 % - -  08/09/18 2030 135/88 (!) 97.5 F (36.4 C) Oral 60 15 95 % - -  08/09/18 2015 139/65 - - (!) 58 12 98 % - -  08/09/18 2000 112/74 (!) 97.4 F (36.3 C) Oral 63 15 97 % 5\' 4"  (1.626 m) 92.1 kg  08/09/18 1948 - - - 75 17 99 % - -  08/09/18 1945 139/82 - - 95 11 100 % - -  08/09/18 1938 - - - - -  100 % - -  08/09/18 1937 - - - 75 (!) 30 - - -  08/09/18 1930 138/72 (!) 96.7 F (35.9 C) Oral 76 (!) 26 100 % - -  08/09/18 1925 - - - 95 12 100 % - -  08/09/18 1300 132/80 98.5 F (36.9 C) Oral 80 18 - - -    Physical exam: General: Well nourished, well developed female in no acute distress. Abdomen: mild to moderate distension, +BS, nttp, c/d/i incision Respiratory: no respiratory distress Extremities: no clubbing, cyanosis or edema Skin: Warm and dry.   Medications: Current Facility-Administered Medications  Medication Dose Route Frequency Provider Last Rate Last Dose  . acetaminophen (TYLENOL) tablet 1,000 mg  1,000 mg Oral Q6H Kaylyn LayerHowze, Kathryn E, MD   1,000 mg at 08/10/18 [id number]  . coconut oil  1 application Topical PRN Lazaro ArmsEure, Luther H, MD      . witch hazel-glycerin (TUCKS) pad 1 application  1 application Topical PRN Eure, Amaryllis DykeLuther H, MD       And  . dibucaine (NUPERCAINAL) 1 % rectal ointment 1 application  1 application Rectal PRN Lazaro ArmsEure, Luther H, MD      . diphenhydrAMINE (BENADRYL) capsule 25 mg  25 mg Oral Q6H PRN Lazaro ArmsEure, Luther H, MD      . diphenhydrAMINE (BENADRYL) injection 12.5 mg  12.5 mg Intravenous Q4H PRN Kaylyn LayerHowze, Kathryn E, MD       Or  . diphenhydrAMINE (BENADRYL) capsule 25 mg  25 mg Oral Q4H PRN Kaylyn LayerHowze, Kathryn E, MD      . enoxaparin (LOVENOX) injection 40 mg  40 mg Subcutaneous Q24H Lazaro ArmsEure, Luther  H, MD   40 mg at 08/10/18 1100  . fentaNYL (SUBLIMAZE) injection 50-100 mcg  50-100 mcg Intravenous Q2H PRN Lazaro ArmsEure, Luther H, MD      . gabapentin (NEURONTIN) capsule 100 mg  100 mg Oral TID Lazaro ArmsEure, Luther H, MD   100 mg at 08/10/18 1057  . ketorolac (TORADOL) 30 MG/ML injection 30 mg  30 mg Intravenous Q6H Lazaro ArmsEure, Luther H, MD   30 mg at 08/10/18 [id number]   Followed by  . [START ON 08/11/2018] ibuprofen (ADVIL) tablet 800 mg  800 mg Oral Q6H Lazaro ArmsEure, Luther H, MD      . ketorolac (TORADOL) 30 MG/ML injection 30 mg  30 mg Intravenous Q6H PRN Kaylyn LayerHowze, Kathryn E, MD       Or  . ketorolac (TORADOL) 30 MG/ML injection 30 mg  30 mg Intramuscular Q6H PRN Kaylyn LayerHowze, Kathryn E, MD      . lactated ringers infusion   Intravenous Continuous Lazaro ArmsEure, Luther H, MD   Stopped at 08/10/18 0450  . menthol-cetylpyridinium (CEPACOL) lozenge 3 mg  1 lozenge Oral Q2H PRN Lazaro ArmsEure, Luther H, MD      . methylergonovine (METHERGINE) tablet 0.2 mg  0.2 mg Oral Q4H PRN Lazaro ArmsEure, Luther H, MD       Or  . methylergonovine (METHERGINE) injection 0.2 mg  0.2 mg Intramuscular Q4H PRN Lazaro ArmsEure, Luther H, MD      . nalbuphine (NUBAIN) injection 5 mg  5 mg Intravenous Q4H PRN Kaylyn LayerHowze, Kathryn E, MD       Or  . nalbuphine (NUBAIN) injection 5 mg  5 mg Subcutaneous Q4H PRN Kaylyn LayerHowze, Kathryn E, MD      . nalbuphine (NUBAIN) injection 5 mg  5 mg Intravenous Once PRN Kaylyn LayerHowze, Kathryn E, MD       Or  . nalbuphine (NUBAIN) injection 5 mg  5 mg Subcutaneous  Once PRN Kaylyn LayerHowze, Kathryn E, MD      . naloxone [facility](NARCAN) injection 0.4 mg  0.4 mg Intravenous PRN Kaylyn LayerHowze, Kathryn E, MD       And  . sodium chloride flush (NS) 0.9 % injection 3 mL  3 mL Intravenous PRN Howze, Jaclynn MajorKathryn E, MD      . naloxone HCl (NARCAN) 2 mg in dextrose 5 % 250 mL infusion  1-4 mcg/kg/hr Intravenous Continuous PRN Kaylyn LayerHowze, Kathryn E, MD      . ondansetron [facility](ZOFRAN) injection 4 mg  4 mg Intravenous Q8H PRN Kaylyn LayerHowze, Kathryn E, MD      . oxyCODONE (Oxy IR/ROXICODONE) immediate release tablet 5-10 mg  5-10 mg Oral  Q4H PRN [place] BingPickens, Carel Carrier, MD   10 mg at 08/10/18 [id number]  . oxytocin (PITOCIN) IV infusion 40 units in NS 1000 mL - Premix  2.5 Units/hr Intravenous Continuous Lazaro ArmsEure, Luther H, MD 62.5 mL/hr at 08/09/18 2108 2.5 Units/hr at 08/09/18 2108  . prenatal multivitamin tablet 1 tablet  1 tablet Oral Q1200 Lazaro ArmsEure, Luther H, MD      . senna-docusate (Senokot-S) tablet 2 tablet  2 tablet Oral Q24H Lazaro ArmsEure, Luther H, MD   2 tablet at 08/09/18 2328  . simethicone (MYLICON) chewable tablet 80 mg  80 mg Oral TID PC Lazaro ArmsEure, Luther H, MD   80 mg at 08/10/18 0838  . simethicone (MYLICON) chewable tablet 80 mg  80 mg Oral Q24H Lazaro ArmsEure, Luther H, MD      . simethicone [facility](MYLICON) chewable tablet 80 mg  80 mg Oral PRN Lazaro ArmsEure, Luther H, MD   80 mg at 08/09/18 2329  . Tdap (BOOSTRIX) injection 0.5 mL  0.5 mL Intramuscular Once Lazaro ArmsEure, Luther H, MD      . zolpidem (AMBIEN) tablet 5 mg  5 mg Oral QHS PRN Lazaro ArmsEure, Luther H, MD        Labs:  Recent Labs  Lab 08/09/18 1500 08/10/18 0819  WBC 7.0 21.8*  HGB 9.7* 8.2*  HCT 30.6* 26.0*  PLT 346 274    Recent Labs  Lab 08/09/18 1500  NA 137  K 4.2  CL 107  CO2 19*  BUN 6  CREATININE 0.79  CALCIUM 8.6*  PROT 5.6*  BILITOT 0.8  ALKPHOS 198*  ALT 10  AST 28  GLUCOSE 76     Radiology: none  Assessment & Plan:  Pt doing well *Postop/PP: routine care. O pos. Start po iron for asymtpomatic anemia *Transient HTN: follow closely. Recommend 1wk bp check *ID: 2nd covid test this afternoon *PPx: lovenox, oob *FEN/GI: regular diet *Dispo: possibly tomorrow.   Cornelia Copaharlie Kimyata Milich, Jr. MD Attending Center for [facility](Faculty Practice)

## 2018-08-11 NOTE — Progress Notes (Signed)
Subjective: Postpartum Day 2: Cesarean Delivery 08/09/18 at 1811 Patient reports extreme fatigue and is unable to stay awake during assessment Patient declines to answer questions  Objective: Vital signs in last 24 hours: Temp:  [97.6 F (36.4 C)-98.8 F (37.1 C)] 98.8 F (37.1 C) (07/11 2337) Pulse Rate:  [67-94] 73 (07/12 0612) Resp:  [16-18] 16 (07/12 0612) BP: (121-137)/(77-90) 131/79 (07/12 0612) SpO2:  [96 %-99 %] 99 % (07/11 2054)  Physical Exam: Declined by patient   Recent Labs    08/09/18 1500 08/10/18 0819  HGB 9.7* 8.2*  HCT 30.6* 26.0*   Patient Vitals for the past 24 hrs:  BP Temp Temp src Pulse Resp SpO2  08/11/18 0612 131/79 - - 73 16 -  08/10/18 2337 137/84 98.8 F (37.1 C) - 77 18 -  08/10/18 2054 123/77 98.3 F (36.8 C) Oral 67 18 99 %  08/10/18 1659 121/80 98.1 F (36.7 C) Oral 81 17 96 %  08/10/18 1231 130/90 97.6 F (36.4 C) Oral 94 18 98 %   Assessment/Plan: Rounding attempted Patient somnolent, declines physical exam Will return to room for additional rounding attempt later this morning  Darlina Rumpf, CNM 08/11/2018, 0940 AM

## 2018-08-11 NOTE — Clinical Social Work Maternal (Signed)
CLINICAL SOCIAL WORK MATERNAL/CHILD NOTE  Patient Details  Name: Sarah Schmitt MRN: [id number] Date of Birth: 10/06/1995  Date:  08/11/2018  Clinical Social Worker Initiating Note:  Ketzaly Cardella, LCSW     Date/Time: Initiated:  08/11/18/1450             Child's Name:  [name] & [name]   Biological Parents:  Mother, Father(Father: Dayron Stevenson)   Need for Interpreter:  None   Reason for Referral:  Current Substance Use/Substance Use During Pregnancy , Other (Comment)(Concerns of ability to care for children)   Address:  [address] [place] [address]    Phone number:  [phone] (home)     Additional phone number:   Household Members/Support Persons (HM/SP):   Household Member/Support Person 1, Household Member/Support Person 2, Household Member/Support Person 3   HM/SP Name Relationship DOB or Age  HM/SP -1 Kyaire Justin son 07/27/10  HM/SP -2 Ashton Stevenson son 08/28/16  HM/SP -3 Milani Stevenson daughter 10/02/17  HM/SP -4        HM/SP -5        HM/SP -6        HM/SP -7        HM/SP -8          Natural Supports (not living in the home): Parent, Immediate Family   Professional Supports:None   Employment:Unemployed   Type of Work:     Education:      Homebound arranged:    Financial Resources:Medicaid   Other Resources: Food Stamps , WIC   Cultural/Religious Considerations Which May Impact Care:   Strengths: Ability to meet basic needs , Home prepared for child , Pediatrician chosen   Psychotropic Medications:         Pediatrician:    [place] area  Pediatrician List:    [facility]  [place]    [place]    [place]    [place]    [place]      Pediatrician Fax Number:    Risk Factors/Current Problems: None   Cognitive State: Alert , Able to Concentrate , Insightful , Linear Thinking , Goal  Oriented    Mood/Affect: Relaxed , Interested , Calm    CSW Assessment:CSW met with MOB at bedside to discuss consult for substance use during pregnancy and concerns about ability to care for children. MOB was sitting up in bed and the infants were asleep on the bed beside MOB. CSW introduced self and explained reason for consult. MOB was welcoming, open and engaged during assessment. MOB reported that she resides with her 3 other children and is currently unemployed. MOB reported that she receives both WIC and food stamps. MOB reported that she has all items needed to care for infants including car seats and cribs. CSW inquired about MOB's support system, MOB reported that her siblings and parents are her supports. MOB reported that FOB is involved when he "wants to be". MOB reported that her brother is currently caring for her 10 month old daughter and will be caring for her after discharge until she is able to get the twins on a schedule. MOB reported that her grandmother is currently caring for her one year old son and will be helping her with him after she is discharged. MOB reported that her 8 year old son is currently with his grandfather and that he will probably spend a lot of time with his grandfather. MOB reported that her 8   year old son is really close to his grandfather. MOB reported that her sister plans to come to her house after she is discharged to help her with the twins. MOB reported that she has a good support system. CSW inquired if MOB was interested in additional support from parenting education programs, MOB interested. CSW agreed to make referral to [facility] and [facility], MOB agreeable.   CSW inquired about MOB's mental health history, MOB denied any mental health history. MOB reported that she may have had postpartum depression with her 23 year old. MOB reported that her symptoms started when he was 23 week old and lasted about 3-4 weeks. MOB described her symptoms as crying and being sad. MOB reported that she did not  take medication and that the symptoms resolved on their own. CSW inquired about how MOB was currently feeling emotionally, MOB reported that she was feeling good especially after getting some rest while infants were in the nursery. MOB reported that initially she was restless and twins were fussy. MOB reported that her FOB was her support and left today, MOB reported that she is happy that twins are resting better. MOB presented calm and was open during assessment. MOB did not demonstrate any acute mental health signs/symptoms. CSW assessed for safety, MOB denied SI, HI and domestic violence. 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   CSW provided education regarding the baby blues period vs. perinatal mood disorders, discussed treatment and gave resources for mental health follow up if concerns arise.  CSW recommends self-evaluation during the postpartum time period using the New Mom Checklist from [employer]partum [employer] and encouraged MOB to contact a medical professional if symptoms are noted at any time.    CSW provided review of Sudden Infant Death Syndrome (SIDS) precautions. CSW verbalized the importance of infants having their own safe sleeping areas and informed MOB that co-sleeping was unsafe. MOB verbalized understanding.   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 MOB verbalized understanding and denied any other substance use during pregnancy. MOB reported that she had an old prescripiton from when she had her 23 year old son and took the medication because she was in pain. MOB reported that this was her most painful pregnancy and that she was constantly doing stuff because she had to care for other children. MOB reported that she wouldn't have taken the medication if she knew it would have affected the infants like this. MOB reported that the pediatrician spoke with her about NAS.  CSW informed MOB that CDS would continue to be monitored and a CPS report would be made if warranted. MOB verbalized understanding and denied any CPS history.   MOB reported that she has support to help her care for her children. CSW identifies no further need for intervention and no barriers to discharge at this time.  CSW Plan/Description:  No Further Intervention Required/No Barriers to Discharge, Sudden Infant Death Syndrome (SIDS) Education, Perinatal Mood and Anxiety Disorder (PMADs) Education, [place], CSW Will Continue to Monitor Umbilical Cord Tissue Drug Screen Results and Make Report if Warranted, Other Information/Referral to [employer], [place] 08/11/2018, 2:55 PM

## 2018-08-12 ENCOUNTER — Encounter (HOSPITAL_COMMUNITY): Payer: Self-pay | Admitting: *Deleted

## 2018-08-12 DIAGNOSIS — Z5321 Procedure and treatment not carried out due to patient leaving prior to being seen by health care provider: Secondary | ICD-10-CM | POA: Diagnosis not present

## 2018-08-12 DIAGNOSIS — Z4801 Encounter for change or removal of surgical wound dressing: Secondary | ICD-10-CM | POA: Diagnosis not present

## 2018-08-12 LAB — NOVEL CORONAVIRUS, NAA (HOSP ORDER, SEND-OUT TO REF LAB; TAT 18-24 HRS): SARS-CoV-2, NAA: NOT DETECTED

## 2018-08-12 MED ORDER — OXYCODONE-ACETAMINOPHEN 5-325 MG PO TABS: 1.0000 | ORAL_TABLET | Freq: Four times a day (QID) | ORAL | 0 refills | Status: AC | PRN

## 2018-08-12 MED ORDER — DOCUSATE SODIUM 100 MG PO CAPS: 100.0000 mg | ORAL_CAPSULE | Freq: Two times a day (BID) | ORAL | 0 refills | Status: AC | PRN

## 2018-08-12 MED ORDER — IBUPROFEN 800 MG PO TABS: 800.0000 mg | ORAL_TABLET | Freq: Three times a day (TID) | ORAL | 0 refills | Status: AC | PRN

## 2018-08-12 NOTE — Progress Notes (Signed)
MOB was not in the room when RN entered at 2300. RN made charge aware and was able to contact MOB via cell phone. MOB stated that she was in the parking garage "resting and getting some fresh air while her twins were in the nursery." RN advised her to return to her room. MOB returned shortly after midnight and was spoken to by the heath care team about not leaving the unit. She now understands that it will  be considered "abandonment" if it is to happen again. Will continue to monitor.

## 2018-08-12 NOTE — Discharge Summary (Signed)
Postpartum Discharge Summary     Patient Name: Sarah Schmitt DOB: 10/09/1995 MRN: [id number]  Date of admission: 08/09/2018 Delivering Provider:    Juanda Schmitt, Girl Schmitt [[id number]][[id number]]  Sarah Schmitt    Grams, BoyB Derin [[id number]][[id number]]  Duane LopeEURE, Sarah Schmitt    Date of discharge: 08/12/2018  Admitting diagnosis: RCS Intrauterine pregnancy: 8158w2d     Secondary diagnosis:  Principal Problem:   History of cesarean delivery x 3 affecting pregnancy Active Problems:   Late prenatal care   Anemia during pregnancy   Supervision of high-risk pregnancy   Twin gestation, dichorionic diamniotic   COVID-19 virus infection   S/P cesarean section  Additional problems: None     Discharge diagnosis: Term Pregnancy Delivered                                                                                                Post partum procedures:none  Augmentation: n/a  Complications: None  Hospital course:  Sceduled C/S   23 y.o. yo [phone] at 6958w2d was admitted to the hospital 08/09/2018 for scheduled cesarean section with the following indication:Elective Repeat.  Membrane Rupture Time/Date:    Sarah Schmitt, Girl Shaka [[id number]][[id number]]  6:09 PM    Sarah Schmitt, BoyB Schmitt [[id number]][[id number]]  6:12 PM  ,   Sarah Schmitt [[id number]][[id number]]  08/09/2018    Sarah Schmitt, BoyB Schmitt [[id number]][[id number]]  08/09/2018    Patient delivered a Viable infant.   Sarah Schmitt [[id number]][[id number]]  08/09/2018    Sarah Schmitt, BoyB Schmitt [[id number]][[id number]]  08/09/2018   Details of operation can be found in separate operative note.  Sarah Schmitt had an uncomplicated postpartum course.  She is ambulating, tolerating a regular diet, passing flatus, and urinating well. Patient is discharged home in stable condition on  08/12/18         Magnesium Sulfate recieved: No BMZ received: No  Physical exam  Vitals:   08/10/18 2337 08/11/18 0612 08/11/18 2134 08/12/18 0601  BP: 137/84 131/79 139/82 134/72  Pulse: 77 73  60 74  Resp: 18 16 16 18   Temp: 98.8 F (37.1 C)  98.4 F (36.9 C) 98 F (36.7 C)  TempSrc:   Oral Oral  SpO2:   100%   Weight:      Height:       General: alert, cooperative and no distress Lochia: appropriate Uterine Fundus: firm Incision: Healing well with no significant drainage, No significant erythema, dressing >1.3 saturated with serosanguinous fluid so was removed. Staples are well appearing with no erythema surrounding. DVT Evaluation: No evidence of DVT seen on physical exam. Negative Homan's sign. No cords or calf tenderness. No significant calf/ankle edema. Labs: Lab Results  Component Value Date   WBC 21.8 (Schmitt) 08/10/2018   HGB 8.2 (L) 08/10/2018   HCT 26.0 (L) 08/10/2018   MCV 82.8 08/10/2018   PLT 274 08/10/2018   CMP Latest Ref Rng & Units 08/09/2018  Glucose 70 - 99 mg/dL 76  BUN 6 - 20 mg/dL 6  Creatinine 0.630.44 - 0.161.00 mg/dL 0.100.79  Sodium 932135 - 355145 mmol/L 137  Potassium 3.5 - 5.1 mmol/L 4.2  Chloride 98 - 111 mmol/L 107  CO2 22 - 32 mmol/L 19(L)  Calcium 8.9 - 10.3 mg/dL 8.6(L)  Total Protein 6.5 - 8.1 g/dL 5.6(L)  Total Bilirubin 0.3 - 1.2 mg/dL 0.8  Alkaline Phos 38 - 126 U/L 198(Schmitt)  AST 15 - 41 U/L 28  ALT 0 - 44 U/L 10    Discharge instruction: per After Visit Summary and "Baby and Me Booklet".  After visit meds:  Allergies as of 08/12/2018   No Known Allergies     Medication List    TAKE these medications   cyclobenzaprine 5 MG tablet Commonly known as: FLEXERIL Take 5 mg by mouth 3 (three) times daily as needed for muscle spasms.   docusate sodium 100 MG capsule Commonly known as: COLACE Take 1 capsule (100 mg total) by mouth 2 (two) times daily as needed.   ibuprofen 800 MG tablet Commonly known as: ADVIL Take 1 tablet (800 mg total) by mouth every 8 (eight) hours as needed.   multivitamin-prenatal 27-0.8 MG Tabs tablet Take 1 tablet by mouth daily at 12 noon.   oxyCODONE-acetaminophen 5-325 MG tablet Commonly known as:  Percocet Take 1-2 tablets by mouth every 6 (six) hours as needed for severe pain.       Diet: routine diet  Activity: Advance as tolerated. Pelvic rest for 6 weeks.   Outpatient follow up:Pt declined staple removal in hospital as offered, would prefer to keep another few days and have removed outpatient.  Message sent to [facility] to make appt. Follow up Appt:No future appointments. Follow up Visit: Sarah Schmitt Follow up.   Why: For staple removal/incision check this week, and for PP visit in 4 weeks. Contact information: [address] [place] [phone]           Please schedule this patient for Postpartum visit in: 4 weeks with the following provid er: MD For C/S patients schedule nurse incision check in weeks 2 weeks: schedule appt 7/15, 7/16, or 7/17 for staple removal and incision check High risk pregnancy complicated by: di/di twins Delivery mode:  CS Anticipated Birth Control:  IUD placed in hospital PP Procedures needed: Incision check  Schedule Integrated BH visit: no      Newborn Data:   Sarah Schmitt [[id number]]  Live born female  Birth Weight: 5 lb 10.7 oz (2570 g) APGAR: 64, 9  Newborn Delivery   Birth date/time: 08/09/2018 18:11:00 Delivery type: C-Section, Vacuum Assisted Trial of labor: No C-section categorization: Repeat       Sarah Schmitt [[id number]]  Live born female  Birth Weight: 5 lb 12.8 oz (2630 g) APGAR: 64, 9  Newborn Delivery   Birth date/time: 08/09/2018 18:12:00 Delivery type: C-Section, Low Transverse Trial of labor: No C-section categorization: Repeat      Baby Feeding: Breast Disposition:home with mother   08/12/2018 Sarah Schmitt, CNM

## 2018-08-13 LAB — BPAM RBC
Blood Product Expiration Date: 202008112359
Blood Product Expiration Date: 202008112359
ISSUE DATE / TIME: 202007101635
ISSUE DATE / TIME: 202007101635
Unit Type and Rh: 5100
Unit Type and Rh: 5100

## 2018-08-13 LAB — TYPE AND SCREEN
ABO/RH(D): O POS
Antibody Screen: NEGATIVE
Unit division: 0
Unit division: 0

## 2018-08-16 ENCOUNTER — Ambulatory Visit: Payer: Medicaid Other | Admitting: Obstetrics and Gynecology

## 2018-08-17 ENCOUNTER — Other Ambulatory Visit: Payer: Self-pay

## 2018-08-17 ENCOUNTER — Inpatient Hospital Stay (HOSPITAL_COMMUNITY)
Admission: AD | Admit: 2018-08-17 | Discharge: 2018-08-17 | Disposition: A | Discharge status: Home / Self Care | Payer: Medicaid Other | Attending: Obstetrics and Gynecology | Admitting: Obstetrics and Gynecology

## 2018-08-17 ENCOUNTER — Encounter (HOSPITAL_COMMUNITY): Payer: Self-pay | Admitting: *Deleted

## 2018-08-17 DIAGNOSIS — Z4889 Encounter for other specified surgical aftercare: Secondary | ICD-10-CM | POA: Diagnosis not present

## 2018-08-17 DIAGNOSIS — O9089 Other complications of the puerperium, not elsewhere classified: Secondary | ICD-10-CM | POA: Diagnosis not present

## 2018-08-17 DIAGNOSIS — O9989 Other specified diseases and conditions complicating pregnancy, childbirth and the puerperium: Secondary | ICD-10-CM

## 2018-08-17 DIAGNOSIS — D649 Anemia, unspecified: Secondary | ICD-10-CM | POA: Insufficient documentation

## 2018-08-17 DIAGNOSIS — R030 Elevated blood-pressure reading, without diagnosis of hypertension: Secondary | ICD-10-CM | POA: Diagnosis present

## 2018-08-17 DIAGNOSIS — Z79899 Other long term (current) drug therapy: Secondary | ICD-10-CM | POA: Insufficient documentation

## 2018-08-17 DIAGNOSIS — Z8249 Family history of ischemic heart disease and other diseases of the circulatory system: Secondary | ICD-10-CM | POA: Diagnosis not present

## 2018-08-17 DIAGNOSIS — Z8619 Personal history of other infectious and parasitic diseases: Secondary | ICD-10-CM | POA: Diagnosis not present

## 2018-08-17 DIAGNOSIS — Z87891 Personal history of nicotine dependence: Secondary | ICD-10-CM | POA: Insufficient documentation

## 2018-08-17 LAB — COMPREHENSIVE METABOLIC PANEL
ALT: 15 U/L (ref 0–44)
AST: 16 U/L (ref 15–41)
Albumin: 2.4 g/dL — ABNORMAL LOW (ref 3.5–5.0)
Alkaline Phosphatase: 126 U/L (ref 38–126)
Anion gap: 9 (ref 5–15)
BUN: 6 mg/dL (ref 6–20)
CO2: 23 mmol/L (ref 22–32)
Calcium: 8.3 mg/dL — ABNORMAL LOW (ref 8.9–10.3)
Chloride: 109 mmol/L (ref 98–111)
Creatinine, Ser: 0.73 mg/dL (ref 0.44–1.00)
GFR calc Af Amer: >60 mL/min (ref >60)
GFR calc non Af Amer: >60 mL/min (ref >60)
Glucose, Bld: 85 mg/dL (ref 70–99)
Potassium: 3.6 mmol/L (ref 3.5–5.1)
Sodium: 141 mmol/L (ref 135–145)
Total Bilirubin: 0.3 mg/dL (ref 0.3–1.2)
Total Protein: 5.8 g/dL — ABNORMAL LOW (ref 6.5–8.1)

## 2018-08-17 LAB — URINALYSIS, ROUTINE W REFLEX MICROSCOPIC
Bilirubin Urine: NEGATIVE
Glucose, UA: NEGATIVE mg/dL
Hgb urine dipstick: NEGATIVE
Ketones, ur: NEGATIVE mg/dL
Leukocytes,Ua: NEGATIVE
Nitrite: NEGATIVE
Protein, ur: NEGATIVE mg/dL
Specific Gravity, Urine: 1.01 (ref 1.005–1.030)
pH: 7 (ref 5.0–8.0)

## 2018-08-17 LAB — CBC
HCT: 23.3 % — ABNORMAL LOW (ref 36.0–46.0)
Hemoglobin: 7.3 g/dL — ABNORMAL LOW (ref 12.0–15.0)
MCH: 26.4 pg (ref 26.0–34.0)
MCHC: 31.3 g/dL (ref 30.0–36.0)
MCV: 84.4 fL (ref 80.0–100.0)
Platelets: 359 10*3/uL (ref 150–400)
RBC: 2.76 MIL/uL — ABNORMAL LOW (ref 3.87–5.11)
RDW: 18.2 % — ABNORMAL HIGH (ref 11.5–15.5)
WBC: 9.7 10*3/uL (ref 4.0–10.5)
nRBC: 0.2 % (ref 0.0–0.2)

## 2018-08-17 LAB — PROTEIN / CREATININE RATIO, URINE
Creatinine, Urine: 67.45 mg/dL
Total Protein, Urine: <6 mg/dL

## 2018-08-17 MED ORDER — NIFEDIPINE ER OSMOTIC RELEASE 30 MG PO TB24: 30.0000 mg | ORAL_TABLET | Freq: Every day | ORAL | 0 refills | Status: AC

## 2018-08-17 MED ORDER — NIFEDIPINE ER OSMOTIC RELEASE 30 MG PO TB24
30.0000 mg | ORAL_TABLET | Freq: Once | ORAL | Status: AC
  Administered 2018-08-17: 30 mg via ORAL
  Filled 2018-08-17: qty 1

## 2018-08-17 MED ORDER — ACETAMINOPHEN 500 MG PO TABS
1000.0000 mg | ORAL_TABLET | Freq: Once | ORAL | Status: AC
  Administered 2018-08-17: 05:00:00 1000 mg via ORAL
  Filled 2018-08-17: qty 2

## 2018-08-17 NOTE — MAU Provider Note (Signed)
History     CSN: [id number]  Arrival date and time: 08/17/18 0350   First Provider Initiated Contact with Patient 08/17/18 0426      Chief Complaint  Patient presents with  . Drainage from Incision   [name] is a 23 y.o. [phone] at ppartum (s/p C/S for di-di-twins, hx C/S x3 on 08/09/2018) who presents to MAU for discharge from her C/S scar. Of note, operative note from C/S was not complete at time of MAU visit, but pt reports uncomplicated C/S.  Onset: a couple of days ago, again at 12midnight this past night Location: C/S incision Duration: 2 separate instances Character: serosanguinous fluid, "not entirely blood," "soaked my underwear" Aggravating/Associated: none/none Relieving: none Treatment: none  Of note, pt has elevated BP on admission to MAU of 158/91. In postpartum discharge note, BPs were 130s/80s, normal BPs during antenatal care. Pt reports HA that started around 0300 today. Pt denies hx of HA issues. Pt denies issues with BP this pregnancy or ever. Pt denies taking any medication for HA today. Last medication taken was ibuprofen yesterday at 12noon. Pt rates HA as 3/10. Pt usually takes Tylenol for HA, which she reports works for her.  Pt reports when she attempts to take a deep breath, she experiences epigastric discomfort, so she does not want to breath deeply.  Pt denies blurry vision/seeing spots, N/V, abdominal pain apart from her C/S scar, swelling in face and hands, SOB, chest pain.  Pt denies abdominal pain, constipation, diarrhea, or urinary problems. Pt denies fever, chills, fatigue, sweating or changes in appetite. Pt denies dizziness, light-headedness, weakness.  Problems this pregnancy include: di-di twins, anemia. Allergies? NKDA Current medications/supplements? PNVs, ibuprofen (last took yesterday around 12noon) Pregnant/postpartum/breastfeeding? Postpartum x1wk, breastfeeding Prenatal care provider? [facility], next appt  09/13/2018   OB History    Gravida  4   Para  3   Term  3   Preterm      AB  0   Living  5     SAB  0   TAB  0   Ectopic      Multiple  0   Live Births  5           Past Medical History:  Diagnosis Date  . Anemia   . COVID-19 virus infection 08/01/2018   07/26/18  . Dyspnea   . History of cesarean delivery x 3 affecting pregnancy 03/14/2016   H/o CS x3 Needs 2 attendings per h/o adhesion in op note from 09/2017  . Hypotension   . Supervision of high-risk pregnancy 03/04/2018   Enrolled BRX 05/23/18 (cuff to be mailed via BRX)  Nursing Staff Provider Office Location  [facility] Dating  LMP  Language   ENGLISH Anatomy US  Normal Flu Vaccine   Declined 04/05/2018 Genetic Screen  NIPS:low risk female/female AFP: Negative  TDaP vaccine   Declined 5/18 GTT Third trimester 1 hr GTT Rhogam  NA   LAB RESULTS  Feeding Plan  breastfeed Blood Type O/Positive/-- (03/06 1005)  Contracepti  . Twin gestation, dichorionic diamniotic 04/05/2018   Guidelines for Antenatal Testing and Sonography  (with updated ICD-10 codes)  Updated  10/08/2017 with Dr. Noralee Spaceavi Shankar  Multiple Gestation    DC/DA - O30.049  Concordant (<20%), nml AFV, AGA, no other comorbidities  Discordant (> 20%)**    **O30.003 is extra code for discordant growth**     MC/DA - O30.039  Concordant (< 20%), nml AFV, AGA    Discordant (>20%)**  Twin-twin Transfusion Syndrome - O4    Past Surgical History:  Procedure Laterality Date  . CESAREAN SECTION    . CESAREAN SECTION N/A 08/28/2016   Procedure: CESAREAN SECTION;  Surgeon: Woodroe Mode, MD;  Location: [facility];  Service: Obstetrics;  Laterality: N/A;  . CESAREAN SECTION N/A 10/02/2017   Procedure: REPEAT CESAREAN SECTION;  Surgeon: Mora Bellman, MD;  Location: [place];  Service: Obstetrics;  Laterality: N/A;  . CESAREAN SECTION MULTI-GESTATIONAL N/A 08/09/2018   Procedure: CESAREAN SECTION MULTI-GESTATIONAL and IUD Insert;  Surgeon: Florian Buff, MD;   Location: [facility];  Service: Obstetrics;  Laterality: N/A;    Family History  Problem Relation Age of Onset  . Diabetes Father   . Hypertension Maternal Grandmother   . Diabetes Maternal Grandmother   . Diabetes Paternal Grandmother   . Hypertension Paternal Grandmother   . Diabetes Mother     Social History   Tobacco Use  . Smoking status: Former Smoker    Types: Cigarettes    Quit date: 12/11/2017    Years since quitting: 0.6  . Smokeless tobacco: Never Used  Substance Use Topics  . Alcohol use: No  . Drug use: No    Allergies: No Known Allergies  Medications Prior to Admission  Medication Sig Dispense Refill Last Dose  . cyclobenzaprine (FLEXERIL) 5 MG tablet Take 5 mg by mouth 3 (three) times daily as needed for muscle spasms.   Past Month at Unknown time  . docusate sodium (COLACE) 100 MG capsule Take 1 capsule (100 mg total) by mouth 2 (two) times daily as needed. 30 capsule 0 Past Week at Unknown time  . ibuprofen (ADVIL) 800 MG tablet Take 1 tablet (800 mg total) by mouth every 8 (eight) hours as needed. 30 tablet 0 08/16/2018 at Unknown time  . oxyCODONE-acetaminophen (PERCOCET) 5-325 MG tablet Take 1-2 tablets by mouth every 6 (six) hours as needed for severe pain. 30 tablet 0 08/16/2018 at Unknown time  . Prenatal Vit-Fe Fumarate-FA (MULTIVITAMIN-PRENATAL) 27-0.8 MG TABS tablet Take 1 tablet by mouth daily at 12 noon.   Past Week at Unknown time    Review of Systems  Constitutional: Negative for chills, diaphoresis, fatigue and fever.  Eyes: Negative for visual disturbance.  Respiratory: Negative for shortness of breath.   Cardiovascular: Negative for chest pain.  Gastrointestinal: Positive for abdominal pain (only with inspiration, in epigastric region). Negative for constipation, diarrhea, nausea and vomiting.  Genitourinary: Negative for dysuria, flank pain, frequency, pelvic pain, urgency, vaginal bleeding and vaginal discharge.  Neurological: Positive for  headaches. Negative for dizziness, weakness and light-headedness.   Physical Exam   Blood pressure (!) 141/84, pulse (!) 55, temperature 98.6 F (37 C), temperature source Oral, resp. rate 20, height 5\' 4"  (1.626 m), weight 82.3 kg, unknown if currently breastfeeding.  Patient Vitals for the past 24 hrs:  BP Temp Temp src Pulse Resp Height Weight  08/17/18 0631 (!) 141/84 - - (!) 55 - - -  08/17/18 0616 136/79 - - (!) 56 - - -  08/17/18 0601 (!) 141/85 - - (!) 59 - - -  08/17/18 0546 (!) 142/82 - - (!) 55 - - -  08/17/18 0531 (!) 154/89 - - (!) 50 - - -  08/17/18 0516 (!) 153/84 - - (!) 50 - - -  08/17/18 0500 139/85 - - (!) 52 - - -  08/17/18 0449 (!) 148/63 - - 67 - - -  08/17/18 6440 Marland Kitchen)  158/86 - - (!) 54 - - -  08/17/18 0427 (!) 153/81 - - (!) 51 - - -  08/17/18 0413 (!) 158/91 98.6 F (37 C) Oral (!) 59 20 5\' 4"  (1.626 m) 82.3 kg   Physical Exam  Constitutional: She is oriented to person, place, and time. She appears well-developed and well-nourished. No distress.  HENT:  Head: Normocephalic and atraumatic.  Respiratory: Effort normal.  GI: Soft. She exhibits no distension and no mass. There is no abdominal tenderness. There is no rebound and no guarding.  Genitourinary:    Genitourinary Comments: C/S scar, with staples present, appears well-healed and well-approximated without drainage, redness, swelling, tenderness on palpation or other signs of infection, no fluid palpated behind incision.   Neurological: She is alert and oriented to person, place, and time.  Skin: Skin is warm and dry. She is not diaphoretic.  Psychiatric: She has a normal mood and affect. Her behavior is normal. Judgment and thought content normal.   Results for orders placed or performed during the hospital encounter of 08/17/18 (from the past 24 hour(s))  Protein / creatinine ratio, urine     Status: None   Collection Time: 08/17/18  4:48 AM  Result Value Ref Range   Creatinine, Urine 67.45 mg/dL    Total Protein, Urine <6 mg/dL   Protein Creatinine Ratio        0.00 - 0.15 mg/mg[Cre]  Urinalysis, Routine w reflex microscopic     Status: None   Collection Time: 08/17/18  4:48 AM  Result Value Ref Range   Color, Urine YELLOW YELLOW   APPearance CLEAR CLEAR   Specific Gravity, Urine 1.010 1.005 - 1.030   pH 7.0 5.0 - 8.0   Glucose, UA NEGATIVE NEGATIVE mg/dL   Hgb urine dipstick NEGATIVE NEGATIVE   Bilirubin Urine NEGATIVE NEGATIVE   Ketones, ur NEGATIVE NEGATIVE mg/dL   Protein, ur NEGATIVE NEGATIVE mg/dL   Nitrite NEGATIVE NEGATIVE   Leukocytes,Ua NEGATIVE NEGATIVE  CBC     Status: Abnormal   Collection Time: 08/17/18  4:51 AM  Result Value Ref Range   WBC 9.7 4.0 - 10.5 K/uL   RBC 2.76 (L) 3.87 - 5.11 MIL/uL   Hemoglobin 7.3 (L) 12.0 - 15.0 g/dL   HCT 78.223.3 (L) 95.636.0 - 21.346.0 %   MCV 84.4 80.0 - 100.0 fL   MCH 26.4 26.0 - 34.0 pg   MCHC 31.3 30.0 - 36.0 g/dL   RDW 08.618.2 (H) 57.811.5 - 46.915.5 %   Platelets 359 150 - 400 K/uL   nRBC 0.2 0.0 - 0.2 %  Comprehensive metabolic panel     Status: Abnormal   Collection Time: 08/17/18  4:51 AM  Result Value Ref Range   Sodium 141 135 - 145 mmol/L   Potassium 3.6 3.5 - 5.1 mmol/L   Chloride 109 98 - 111 mmol/L   CO2 23 22 - 32 mmol/L   Glucose, Bld 85 70 - 99 mg/dL   BUN 6 6 - 20 mg/dL   Creatinine, Ser 6.290.73 0.44 - 1.00 mg/dL   Calcium 8.3 (L) 8.9 - 10.3 mg/dL   Total Protein 5.8 (L) 6.5 - 8.1 g/dL   Albumin 2.4 (L) 3.5 - 5.0 g/dL   AST 16 15 - 41 U/L   ALT 15 0 - 44 U/L   Alkaline Phosphatase 126 38 - 126 U/L   Total Bilirubin 0.3 0.3 - 1.2 mg/dL   GFR calc non Af Amer >60 >60 mL/min   GFR calc  Af Amer >60 >60 mL/min   Anion gap 9 5 - 15    No results found.  MAU Course  Procedures  MDM -incision check for drainage of serosanguinous fluid x2 with soaking of pt clothing, per pt report -incision looks well-healed and well-approximated without drainage, redness, swelling, tenderness on palpation or other signs of infection, no  fluid palpated behind incision. -normal temp  -elevated BP with HA 3/10 -epigastric pain only with deep inspiration -pt refused cath by nurse for PCr, results interpreted in this context (of note, pt reports that she has not had any vaginal bleeding since the C/S) -UA: WNL -CBC: H/H 7.3/23.3 (was 8.2/26 on 08/10/2018), platelets 359 -CMP: no abnormalities requiring treatment, AST/ALT 16/15 -PCr: results below reportable range, unable to calculate -Tylenol  given for HA, after administration, pt rates HA as 0/10  -called and spoke with Dr. Earlene Plater . Per Dr. Earlene Plater, does not need imaging along C/S incision, start on Procardia 30XL daily, needs BP check and office f/u on Wednesday or Thursday this week for this and repeat CBC, message sent to [facility] clinic -"meds to beds" consult order entered and signed, called pharmacy number per order note , no answer -order removed for meds-to-beds as pharmacy was closed and RNs and attendings not familiar with program to understand what should be done if pharmacy was closed. This is OK per Dr. Earlene Plater. -first dose of Procardia XL  given to patient while in MAU, RX sent to pharmacy -pt discharged to home in stable condition  Orders Placed This Encounter  Procedures  . CBC    Standing Status:   Standing    Number of Occurrences:   1  . Comprehensive metabolic panel    Standing Status:   Standing    Number of Occurrences:   1  . Protein / creatinine ratio, urine    Standing Status:   Standing    Number of Occurrences:   1  . Urinalysis, Routine w reflex microscopic    Standing Status:   Standing    Number of Occurrences:   1  . Discharge patient    Order Specific Question:   Discharge disposition    Answer:   01-Home or Self Care [1]    Order Specific Question:   Discharge patient date    Answer:   08/17/2018   Meds ordered this encounter  Medications  . acetaminophen (TYLENOL) tablet 1,000 mg  . NIFEdipine  (PROCARDIA-XL/NIFEDICAL-XL) 24 hr tablet 30 mg  . NIFEdipine (PROCARDIA-XL/NIFEDICAL-XL) 30 MG 24 hr tablet    Sig: Take 1 tablet (30 mg total) by mouth daily.    Dispense:  30 tablet    Refill:  0    Order Specific Question:   Supervising Provider    Answer:   Conan Bowens [[id number]]   Assessment and Plan   1. Elevated blood pressure reading   2. Anemia, unspecified type   3. Postpartum state   4. Encounter for post surgical wound check    Allergies as of 08/17/2018   No Known Allergies     Medication List    TAKE these medications   cyclobenzaprine 5 MG tablet Commonly known as: FLEXERIL Take 5 mg by mouth 3 (three) times daily as needed for muscle spasms.   docusate sodium 100 MG capsule Commonly known as: COLACE Take 1 capsule (100 mg total) by mouth 2 (two) times daily as needed.   ibuprofen 800 MG tablet Commonly known as: ADVIL Take 1 tablet (800  mg total) by mouth every 8 (eight) hours as needed.   multivitamin-prenatal 27-0.8 MG Tabs tablet Take 1 tablet by mouth daily at 12 noon.   NIFEdipine 30 MG 24 hr tablet Commonly known as: PROCARDIA-XL/NIFEDICAL-XL Take 1 tablet (30 mg total) by mouth daily.   oxyCODONE-acetaminophen 5-325 MG tablet Commonly known as: Percocet Take 1-2 tablets by mouth every 6 (six) hours as needed for severe pain.      -message sent to [facility] to schedule pt for BP check and repeat CBC on Wednesday 08/21/2018 or Thursday 08/23/2018 -RX for Procardia 30mg  XL sent, pt aware to pick up medication today -strict preeclampsia/bleeding/return MAU precautions given -pt discharged to home in stable condition  Joni Reiningicole E  08/17/2018, 6:48 AM

## 2018-08-17 NOTE — MAU Note (Signed)
PT SAYS ON 7-10- HAD C/S  FOR TWINS- ( ARE ON PEDS UNIT )   WITH DR ARNOLD .  THIS IS 2ND TIME- HAS HAD A GUSH A BLOOD - [place]ON[place] WAS  AT [place] .  IN TRIAGE - NO DSG.  NO FEVER.

## 2018-08-17 NOTE — Discharge Instructions (Signed)
Cesarean Delivery, Care After This sheet gives you information about how to care for yourself after your procedure. Your health care provider may also give you more specific instructions. If you have problems or questions, contact your health care provider. What can I expect after the procedure? After the procedure, it is common to have:  A small amount of blood or clear fluid coming from the incision.  Some redness, swelling, and pain in your incision area.  Some abdominal pain and soreness.  Vaginal bleeding (lochia). Even though you did not have a vaginal delivery, you will still have vaginal bleeding and discharge.  Pelvic cramps.  Fatigue. You may have pain, swelling, and discomfort in the tissue between your vagina and your anus (perineum) if:  Your C-section was unplanned, and you were allowed to labor and push.  An incision was made in the area (episiotomy) or the tissue tore during attempted vaginal delivery. Follow these instructions at home: Incision care   Follow instructions from your health care provider about how to take care of your incision. Make sure you: ? Wash your hands with soap and water before you change your bandage (dressing). If soap and water are not available, use hand sanitizer. ? If you have a dressing, change it or remove it as told by your health care provider. ? Leave stitches (sutures), skin staples, skin glue, or adhesive strips in place. These skin closures may need to stay in place for 2 weeks or longer. If adhesive strip edges start to loosen and curl up, you may trim the loose edges. Do not remove adhesive strips completely unless your health care provider tells you to do that.  Check your incision area every day for signs of infection. Check for: ? More redness, swelling, or pain. ? More fluid or blood. ? Warmth. ? Pus or a bad smell.  Do not take baths, swim, or use a hot tub until your health care provider says it's okay. Ask your health  care provider if you can take showers.  When you cough or sneeze, hug a pillow. This helps with pain and decreases the chance of your incision opening up (dehiscing). Do this until your incision heals. Medicines  Take over-the-counter and prescription medicines only as told by your health care provider.  If you were prescribed an antibiotic medicine, take it as told by your health care provider. Do not stop taking the antibiotic even if you start to feel better.  Do not drive or use heavy machinery while taking prescription pain medicine. Lifestyle  Do not drink alcohol. This is especially important if you are breastfeeding or taking pain medicine.  Do not use any products that contain nicotine or tobacco, such as cigarettes, e-cigarettes, and chewing tobacco. If you need help quitting, ask your health care provider. Eating and drinking  Drink at least 8 eight-ounce glasses of water every day unless told not to by your health care provider. If you breastfeed, you may need to drink even more water.  Eat high-fiber foods every day. These foods may help prevent or relieve constipation. High-fiber foods include: ? Whole grain cereals and breads. ? Brown rice. ? Beans. ? Fresh fruits and vegetables. Activity   If possible, have someone help you care for your baby and help with household activities for at least a few days after you leave the hospital.  Return to your normal activities as told by your health care provider. Ask your health care provider what activities are safe for  you.  Rest as much as possible. Try to rest or take a nap while your baby is sleeping.  Do not lift anything that is heavier than 10 lbs (4.5 kg), or the limit that you were told, until your health care provider says that it is safe.  Talk with your health care provider about when you can engage in sexual activity. This may depend on your: ? Risk of infection. ? How fast you heal. ? Comfort and desire to  engage in sexual activity. General instructions  Do not use tampons or douches until your health care provider approves.  Wear loose, comfortable clothing and a supportive and well-fitting bra.  Keep your perineum clean and dry. Wipe from front to back when you use the toilet.  If you pass a blood clot, save it and call your health care provider to discuss. Do not flush blood clots down the toilet before you get instructions from your health care provider.  Keep all follow-up visits for you and your baby as told by your health care provider. This is important. Contact a health care provider if:  You have: ? A fever. ? Bad-smelling vaginal discharge. ? Pus or a bad smell coming from your incision. ? Difficulty or pain when urinating. ? A sudden increase or decrease in the frequency of your bowel movements. ? More redness, swelling, or pain around your incision. ? More fluid or blood coming from your incision. ? A rash. ? Nausea. ? Little or no interest in activities you used to enjoy. ? Questions about caring for yourself or your baby.  Your incision feels warm to the touch.  Your breasts turn red or become painful or hard.  You feel unusually sad or worried.  You vomit.  You pass a blood clot from your vagina.  You urinate more than usual.  You are dizzy or light-headed. Get help right away if:  You have: ? Pain that does not go away or get better with medicine. ? Chest pain. ? Difficulty breathing. ? Blurred vision or spots in your vision. ? Thoughts about hurting yourself or your baby. ? New pain in your abdomen or in one of your legs. ? A severe headache.  You faint.  You bleed from your vagina so much that you fill more than one sanitary pad in one hour. Bleeding should not be heavier than your heaviest period. Summary  After the procedure, it is common to have pain at your incision site, abdominal cramping, and slight bleeding from your vagina.  Check  your incision area every day for signs of infection.  Tell your health care provider about any unusual symptoms.  Keep all follow-up visits for you and your baby as told by your health care provider. This information is not intended to replace advice given to you by your health care provider. Make sure you discuss any questions you have with your health care provider. Document Released: 10/08/2001 Document Revised: 07/25/2017 Document Reviewed: 07/25/2017 Elsevier Patient Education  [place]. Anemia  Anemia is a condition in which you do not have enough red blood cells or hemoglobin. Hemoglobin is a substance in red blood cells that carries oxygen. When you do not have enough red blood cells or hemoglobin (are anemic), your body cannot get enough oxygen and your organs may not work properly. As a result, you may feel very tired or have other problems. What are the causes? Common causes of anemia include:  Excessive bleeding. Anemia can be  caused by excessive bleeding inside or outside the body, including bleeding from the intestine or from periods in women.  Poor nutrition.  Long-lasting (chronic) kidney, thyroid, and liver disease.  Bone marrow disorders.  Cancer and treatments for cancer.  HIV (human immunodeficiency virus) and AIDS (acquired immunodeficiency syndrome).  Treatments for HIV and AIDS.  Spleen problems.  Blood disorders.  Infections, medicines, and autoimmune disorders that destroy red blood cells. What are the signs or symptoms? Symptoms of this condition include:  Minor weakness.  Dizziness.  Headache.  Feeling heartbeats that are irregular or faster than normal (palpitations).  Shortness of breath, especially with exercise.  Paleness.  Cold sensitivity.  Indigestion.  Nausea.  Difficulty sleeping.  Difficulty concentrating. Symptoms may occur suddenly or develop slowly. If your anemia is mild, you may not have symptoms. How is  this diagnosed? This condition is diagnosed based on:  Blood tests.  Your medical history.  A physical exam.  Bone marrow biopsy. Your health care provider may also check your stool (feces) for blood and may do additional testing to look for the cause of your bleeding. You may also have other tests, including:  Imaging tests, such as a CT scan or MRI.  Endoscopy.  Colonoscopy. How is this treated? Treatment for this condition depends on the cause. If you continue to lose a lot of blood, you may need to be treated at a hospital. Treatment may include:  Taking supplements of iron, vitamin I69, or folic acid.  Taking a hormone medicine (erythropoietin) that can help to stimulate red blood cell growth.  Having a blood transfusion. This may be needed if you lose a lot of blood.  Making changes to your diet.  Having surgery to remove your spleen. Follow these instructions at home:  Take over-the-counter and prescription medicines only as told by your health care provider.  Take supplements only as told by your health care provider.  Follow any diet instructions that you were given.  Keep all follow-up visits as told by your health care provider. This is important. Contact a health care provider if:  You develop new bleeding anywhere in the body. Get help right away if:  You are very weak.  You are short of breath.  You have pain in your abdomen or chest.  You are dizzy or feel faint.  You have trouble concentrating.  You have bloody or black, tarry stools.  You vomit repeatedly or you vomit up blood. Summary  Anemia is a condition in which you do not have enough red blood cells or enough of a substance in your red blood cells that carries oxygen (hemoglobin).  Symptoms may occur suddenly or develop slowly.  If your anemia is mild, you may not have symptoms.  This condition is diagnosed with blood tests as well as a medical history and physical exam. Other  tests may be needed.  Treatment for this condition depends on the cause of the anemia. This information is not intended to replace advice given to you by your health care provider. Make sure you discuss any questions you have with your health care provider. Document Released: 02/24/2004 Document Revised: 12/29/2016 Document Reviewed: 02/18/2016 Elsevier Patient Education  [place]. Preeclampsia and Eclampsia Preeclampsia is a serious condition that may develop during pregnancy. This condition causes high blood pressure and increased protein in your urine along with other symptoms, such as headaches and vision changes. These symptoms may develop as the condition gets worse. Preeclampsia may occur at  20 weeks of pregnancy or later. Diagnosing and treating preeclampsia early is very important. If not treated early, it can cause serious problems for you and your baby. One problem it can lead to is eclampsia. Eclampsia is a condition that causes muscle jerking or shaking (convulsions or seizures) and other serious problems for the mother. During pregnancy, delivering your baby may be the best treatment for preeclampsia or eclampsia. For most women, preeclampsia and eclampsia symptoms go away after giving birth. In rare cases, a woman may develop preeclampsia after giving birth (postpartum preeclampsia). This usually occurs within 48 hours after childbirth but may occur up to 6 weeks after giving birth. What are the causes? The cause of preeclampsia is not known. What increases the risk? The following risk factors make you more likely to develop preeclampsia:  Being pregnant for the first time.  Having had preeclampsia during a past pregnancy.  Having a family history of preeclampsia.  Having high blood pressure.  Being pregnant with more than one baby.  Being 8 or older.  Being African-American.  Having kidney disease or diabetes.  Having medical conditions such as lupus or blood  diseases.  Being very overweight (obese). What are the signs or symptoms? The most common symptoms are:  Severe headaches.  Vision problems, such as blurred or double vision.  Abdominal pain, especially upper abdominal pain. Other symptoms that may develop as the condition gets worse include:  Sudden weight gain.  Sudden swelling of the hands, face, legs, and feet.  Severe nausea and vomiting.  Numbness in the face, arms, legs, and feet.  Dizziness.  Urinating less than usual.  Slurred speech.  Convulsions or seizures. How is this diagnosed? There are no screening tests for preeclampsia. Your health care provider will ask you about symptoms and check for signs of preeclampsia during your prenatal visits. You may also have tests that include:  Checking your blood pressure.  Urine tests to check for protein. Your health care provider will check for this at every prenatal visit.  Blood tests.  Monitoring your baby's heart rate.  Ultrasound. How is this treated? You and your health care provider will determine the treatment approach that is best for you. Treatment may include:  Having more frequent prenatal exams to check for signs of preeclampsia, if you have an increased risk for preeclampsia.  Medicine to lower your blood pressure.  Staying in the hospital, if your condition is severe. There, treatment will focus on controlling your blood pressure and the amount of fluids in your body (fluid retention).  Taking medicine (magnesium sulfate) to prevent seizures. This may be given as an injection or through an IV.  Taking a low-dose aspirin during your pregnancy.  Delivering your baby early. You may have your labor started with medicine (induced), or you may have a cesarean delivery. Follow these instructions at home: Eating and drinking   Drink enough fluid to keep your urine pale yellow.  Avoid caffeine. Lifestyle  Do not use any products that contain  nicotine or tobacco, such as cigarettes and e-cigarettes. If you need help quitting, ask your health care provider.  Do not use alcohol or drugs.  Avoid stress as much as possible. Rest and get plenty of sleep. General instructions  Take over-the-counter and prescription medicines only as told by your health care provider.  When lying down, lie on your left side. This keeps pressure off your major blood vessels.  When sitting or lying down, raise (elevate) your feet. Try  putting some pillows underneath your lower legs.  Exercise regularly. Ask your health care provider what kinds of exercise are best for you.  Keep all follow-up and prenatal visits as told by your health care provider. This is important. How is this prevented? There is no known way of preventing preeclampsia or eclampsia from developing. However, to lower your risk of complications and detect problems early:  Get regular prenatal care. Your health care provider may be able to diagnose and treat the condition early.  Maintain a healthy weight. Ask your health care provider for help managing weight gain during pregnancy.  Work with your health care provider to manage any long-term (chronic) health conditions you have, such as diabetes or kidney problems.  You may have tests of your blood pressure and kidney function after giving birth.  Your health care provider may have you take low-dose aspirin during your next pregnancy. Contact a health care provider if:  You have symptoms that your health care provider told you may require more treatment or monitoring, such as: ? Headaches. ? Nausea or vomiting. ? Abdominal pain. ? Dizziness. ? Light-headedness. Get help right away if:  You have severe: ? Abdominal pain. ? Headaches that do not get better. ? Dizziness. ? Vision problems. ? Confusion. ? Nausea or vomiting.  You have any of the following: ? A seizure. ? Sudden, rapid weight gain. ? Sudden swelling in  your hands, ankles, or face. ? Trouble moving any part of your body. ? Numbness in any part of your body. ? Trouble speaking. ? Abnormal bleeding.  You faint. Summary  Preeclampsia is a serious condition that may develop during pregnancy.  This condition causes high blood pressure and increased protein in your urine along with other symptoms, such as headaches and vision changes.  Diagnosing and treating preeclampsia early is very important. If not treated early, it can cause serious problems for you and your baby.  Get help right away if you have symptoms that your health care provider told you to watch for. This information is not intended to replace advice given to you by your health care provider. Make sure you discuss any questions you have with your health care provider. Document Released: 01/14/2000 Document Revised: 09/18/2017 Document Reviewed: 08/23/2015 Elsevier Patient Education  2020 [employer].

## 2018-08-17 NOTE — MAU Note (Signed)
Patient refused in and out cath for PCR collection. NP notified.

## 2018-08-22 ENCOUNTER — Ambulatory Visit: Payer: Medicaid Other | Admitting: Obstetrics and Gynecology

## 2018-08-22 ENCOUNTER — Ambulatory Visit: Payer: Medicaid Other

## 2018-08-23 ENCOUNTER — Ambulatory Visit: Payer: Medicaid Other

## 2018-08-27 ENCOUNTER — Encounter: Payer: Self-pay | Admitting: Family Medicine

## 2018-08-27 ENCOUNTER — Ambulatory Visit (INDEPENDENT_AMBULATORY_CARE_PROVIDER_SITE_OTHER): Payer: Medicaid Other | Admitting: Family Medicine

## 2018-08-27 ENCOUNTER — Other Ambulatory Visit: Payer: Self-pay

## 2018-08-27 VITALS — BP 122/72 | HR 91 | Temp 99.0°F | Ht 64.0 in | Wt 165.7 lb

## 2018-08-27 DIAGNOSIS — Z4889 Encounter for other specified surgical aftercare: Secondary | ICD-10-CM

## 2018-08-27 DIAGNOSIS — Z9889 Other specified postprocedural states: Secondary | ICD-10-CM

## 2018-08-27 NOTE — Progress Notes (Signed)
Pt presents for staple removal s/p LTCS 2 weeks ago with twins. Postplacental Liletta  EPDS = 0

## 2018-08-27 NOTE — Progress Notes (Signed)
   Subjective:    Patient ID: Sarah Schmitt is a 23 y.o. female presenting with Postpartum Care  on 08/27/2018  HPI: Here today for staple removal. LTCS #4 for twins done 08/09/2018. Has not had her staples removed yet. Odor at site.  Review of Systems  Constitutional: Negative for chills and fever.  Respiratory: Negative for shortness of breath.   Cardiovascular: Negative for chest pain.  Gastrointestinal: Negative for abdominal pain, nausea and vomiting.  Genitourinary: Negative for dysuria.  Skin: Positive for wound (drainage noted). Negative for rash.      Objective:    BP 122/72   Pulse 91   Temp 99 F (37.2 C)   Ht 5\' 4"  (1.626 m)   Wt 165 lb 11.2 oz (75.2 kg)   Breastfeeding Yes   BMI 28.44 kg/m  Physical Exam Constitutional:      General: She is not in acute distress.    Appearance: She is well-developed.  HENT:     Head: Normocephalic and atraumatic.  Eyes:     General: No scleral icterus. Neck:     Musculoskeletal: Neck supple.  Cardiovascular:     Rate and Rhythm: Normal rate.  Pulmonary:     Effort: Pulmonary effort is normal.  Abdominal:     Palpations: Abdomen is soft.  Skin:    Comments: Macerated skin adherent to staples--removed without difficulty  Neurological:     Mental Status: She is alert and oriented to person, place, and time.         Assessment & Plan:   Problem List Items Addressed This Visit    None    Visit Diagnoses    Encounter for postoperative wound check    -  Primary   Clean with H2O2, keep dry      Total face-to-face time with patient: 10 minutes. Over 50% of encounter was spent on counseling and coordination of care. Return in about 3 weeks (around 09/17/2018) for pp check.  Donnamae Jude 08/27/2018 3:21 PM

## 2018-09-13 ENCOUNTER — Ambulatory Visit: Payer: Medicaid Other | Admitting: Obstetrics and Gynecology

## 2018-09-17 ENCOUNTER — Ambulatory Visit: Payer: Medicaid Other | Admitting: Obstetrics and Gynecology

## 2018-09-18 ENCOUNTER — Telehealth: Payer: Self-pay | Admitting: Obstetrics and Gynecology

## 2019-03-15 DIAGNOSIS — S86812A Strain of other muscle(s) and tendon(s) at lower leg level, left leg, initial encounter: Secondary | ICD-10-CM | POA: Diagnosis not present

## 2019-03-15 DIAGNOSIS — Z79899 Other long term (current) drug therapy: Secondary | ICD-10-CM | POA: Diagnosis not present

## 2019-03-15 DIAGNOSIS — M25562 Pain in left knee: Secondary | ICD-10-CM | POA: Diagnosis not present

## 2019-03-15 DIAGNOSIS — R52 Pain, unspecified: Secondary | ICD-10-CM | POA: Diagnosis not present

## 2019-03-15 DIAGNOSIS — T1490XA Injury, unspecified, initial encounter: Secondary | ICD-10-CM | POA: Diagnosis not present

## 2020-02-28 IMAGING — US US OB EACH ADDL GEST<[ID]
1 series · 15 of 28 positions shown · non-contrast
Comparison: None.

CLINICAL DATA: Pregnant, bleeding

EXAM:
TWIN OBSTETRIC <14WK US AND TRANSVAGINAL OB US

[Series 1: us ob each addl gest<(id) · 37 acquisitions, 15 frames shown]
[im 1/37]
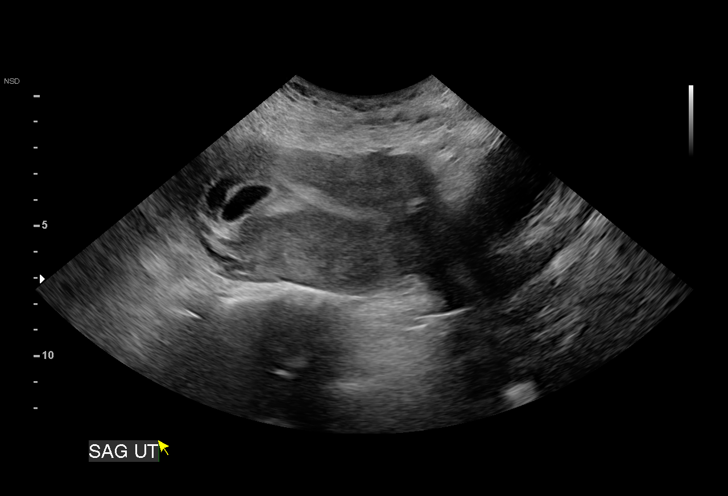
[im 3/37]
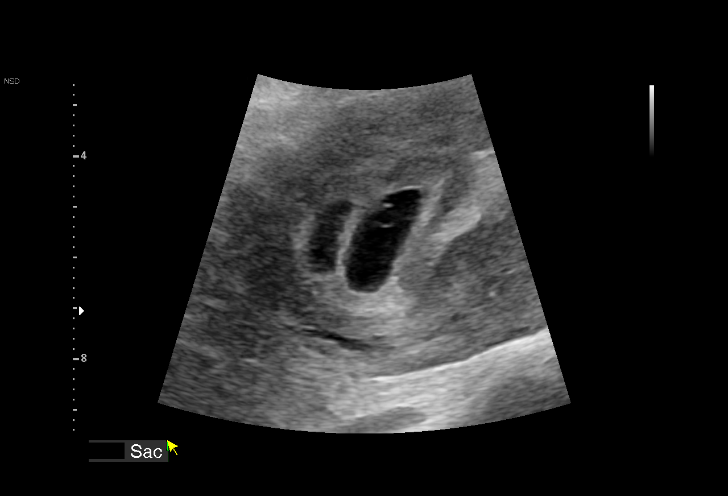
[im 6/37]
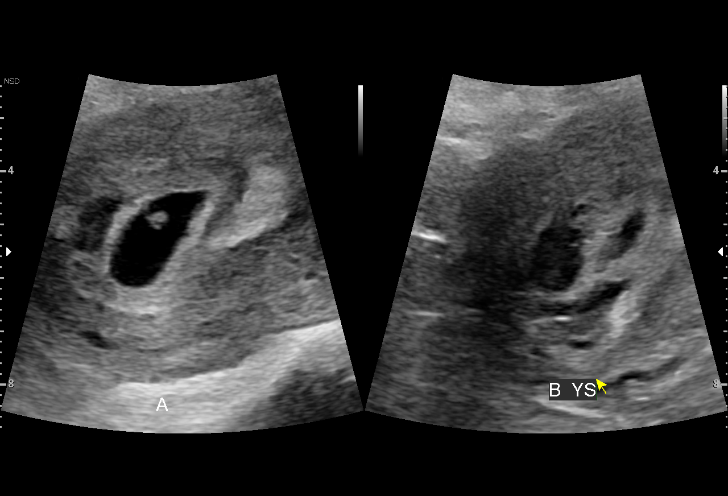
[im 9/37]
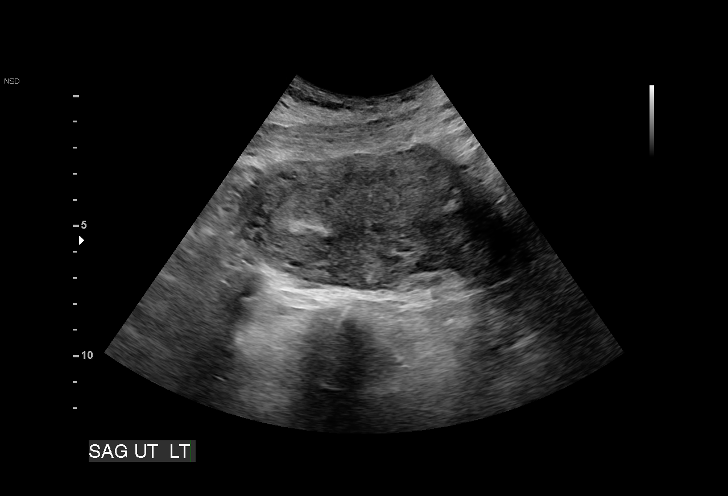
[im 11/37]
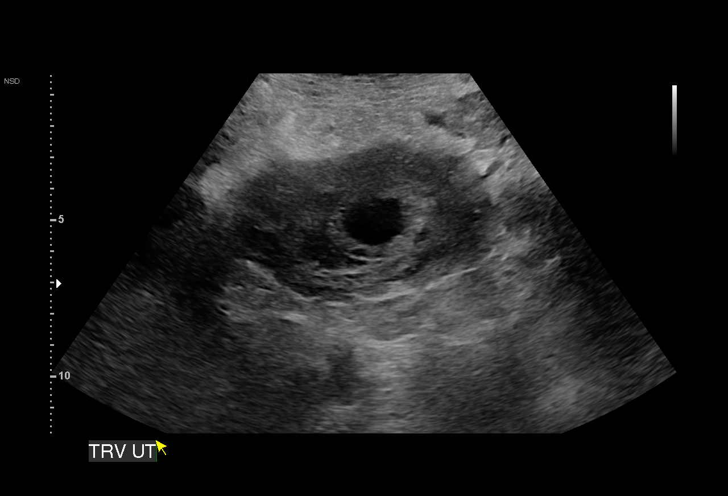
[im 14/37]
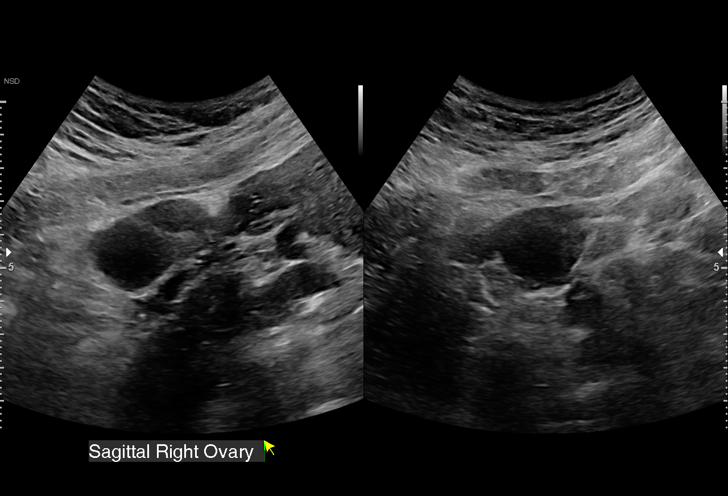
[im 17/37]
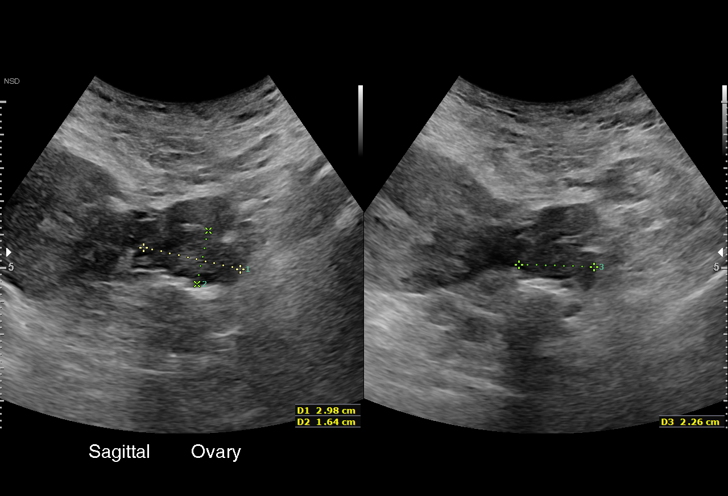
[im 19/37]
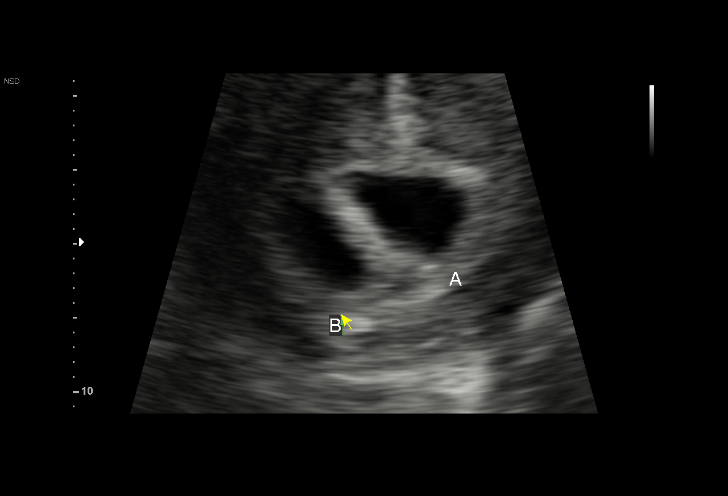
[im 21/37]
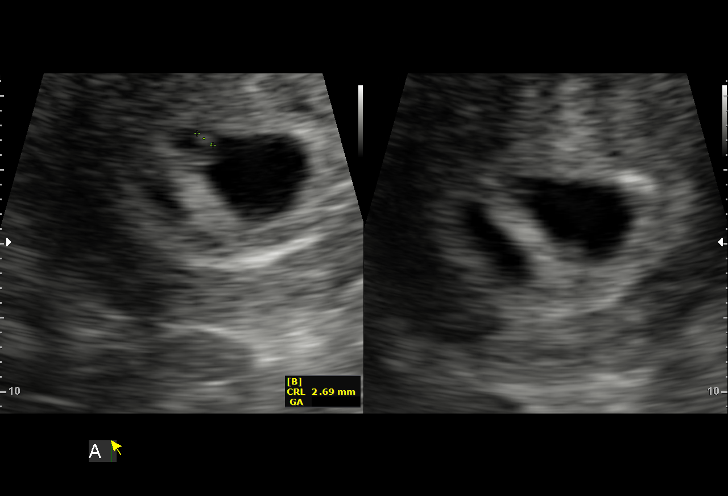
[im 23/37]
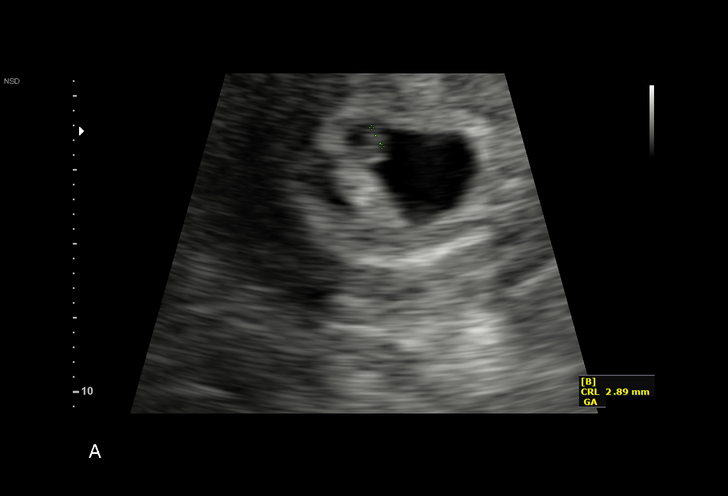
[im 26/37]
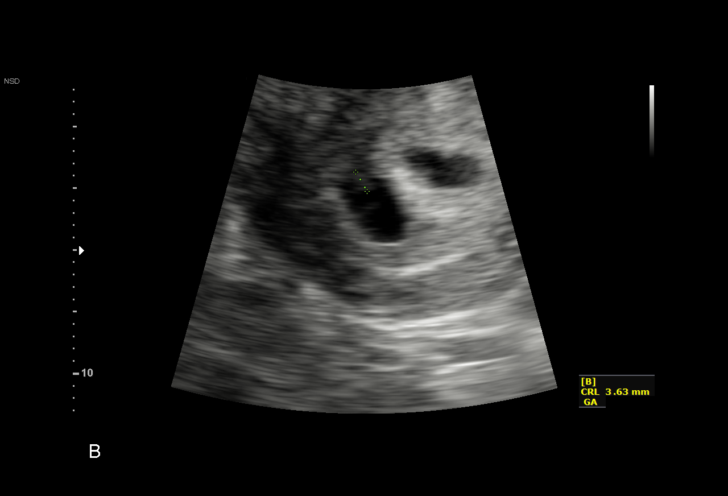
[im 29/37]
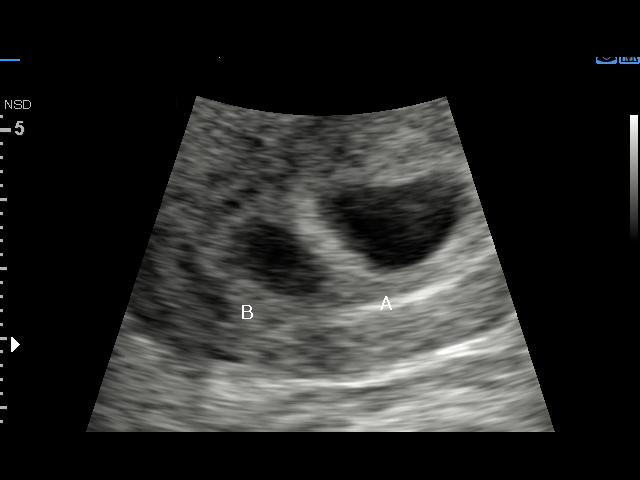
[im 31/37]
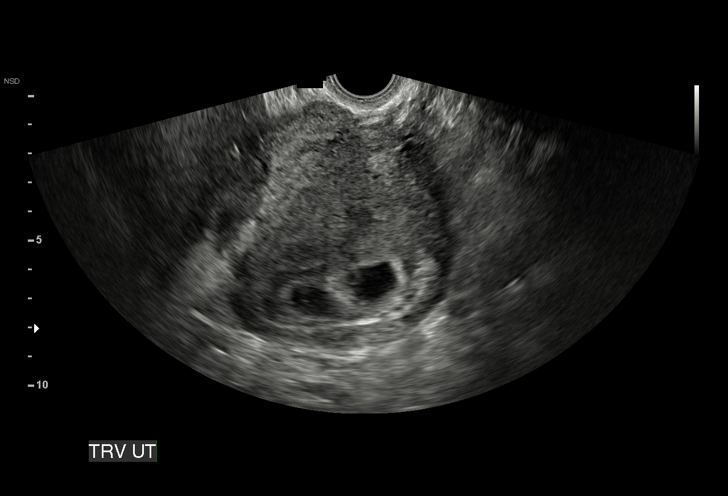
[im 34/37]
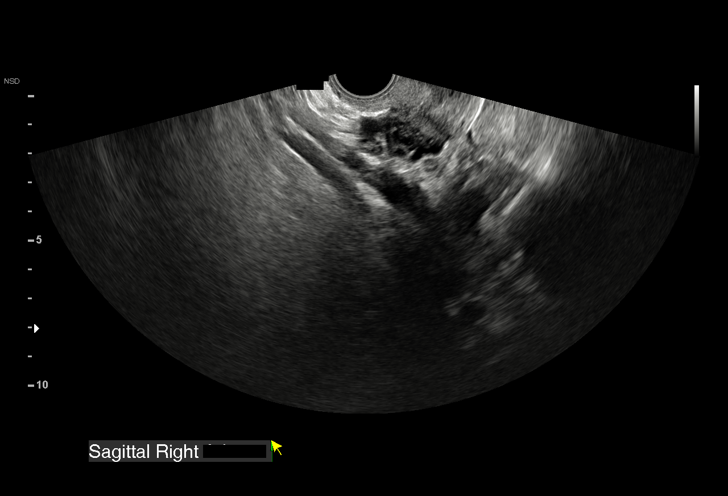
[im 37/37]
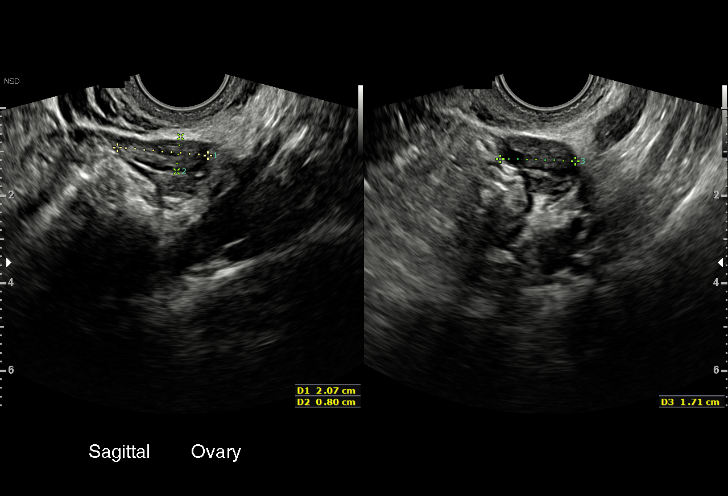

[15 of 28 positions shown; findings below may reference images not displayed]

FINDINGS: Number of IUPs:  2

Chorionicity/Amnionicity:  Dichorionic diamniotic

TWIN 1

Yolk sac:  Visualized.

Embryo:  Visualized.

Cardiac Activity: Visualized.

Heart Rate: 115 bpm

CRL:  35.7 mm   6 w 0 d                  US EDC: 08/20/2018

TWIN 2

Yolk sac:  Visualized.

Embryo:  Visualized.

Cardiac Activity: Visualized.

Heart Rate: 112 bpm

CRL:  34.9 mm   5 w 6 d                  US EDC: 08/21/2018

Subchorionic hemorrhage:  Moderate subchronic hemorrhage.

Maternal uterus/adnexae: Bilateral ovaries are within normal limits.

No free fluid.
IMPRESSION: Live dichorionic diamniotic twin gestations, measuring approximately
5 weeks 6 days by crown-rump length, as above.

## 2020-06-14 IMAGING — US US MFM OB DETAIL ADDL GEST +14 WK
1 series · 12 of 12 positions shown · non-contrast
Comparison: none

[Series 1: us mfm ob detail addl gest +14 wk · 12 of 12 slices shown]
[im 1/12]
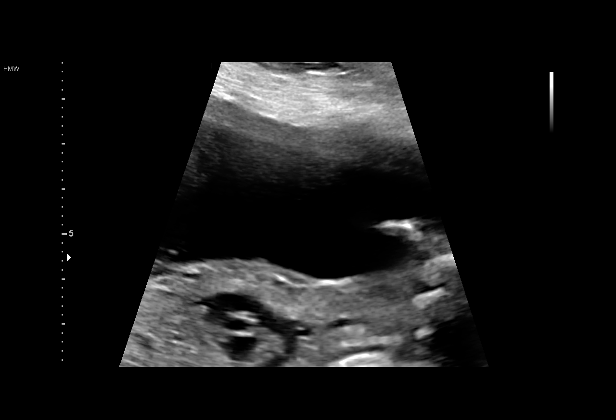
[im 2/12]
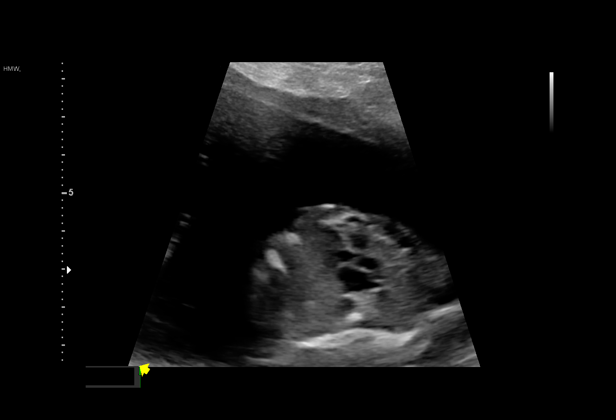
[im 3/12]
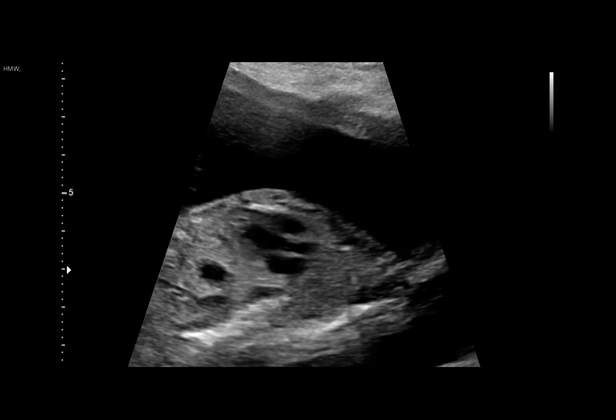
[im 4/12]
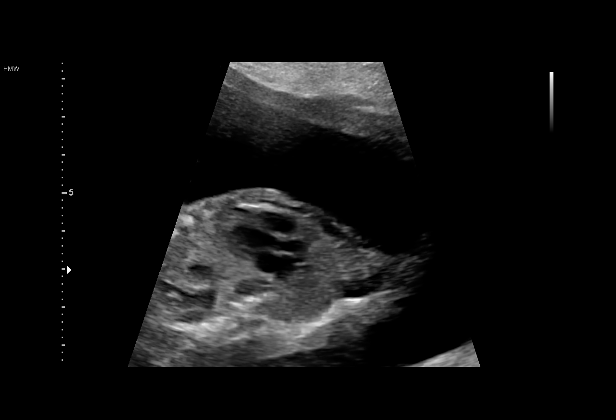
[im 5/12]
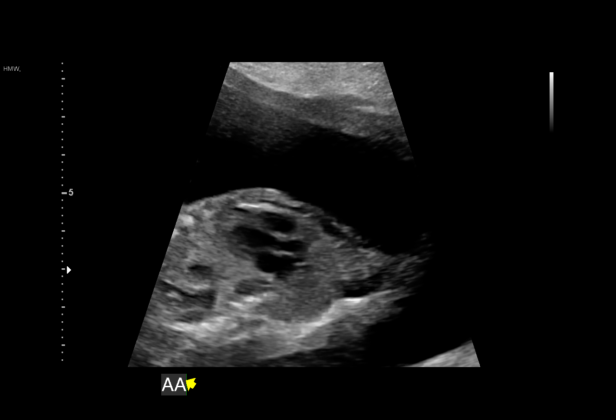
[im 6/12]
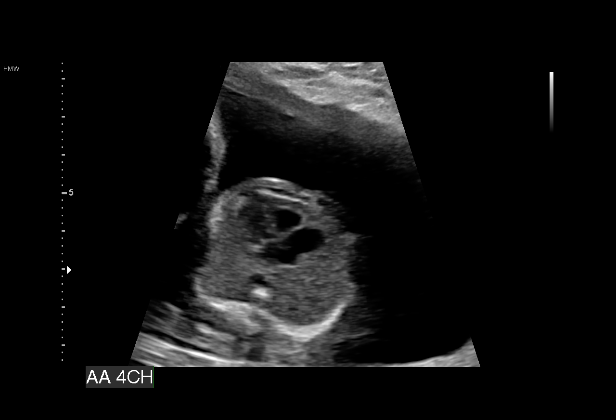
[im 7/12]
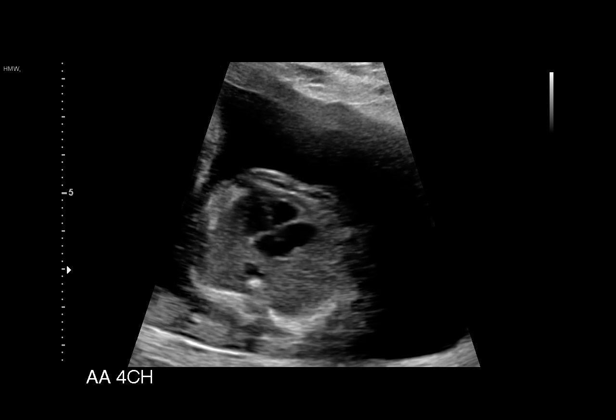
[im 8/12]
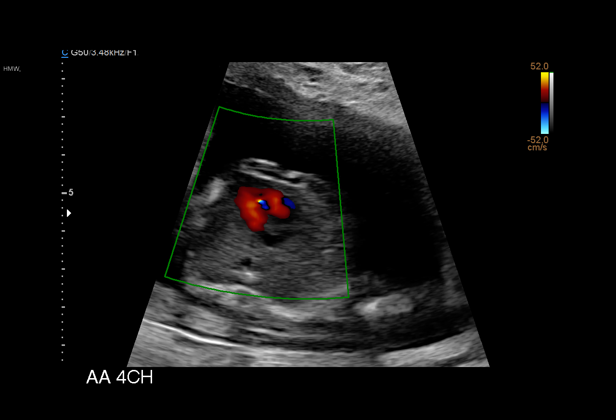
[im 9/12]
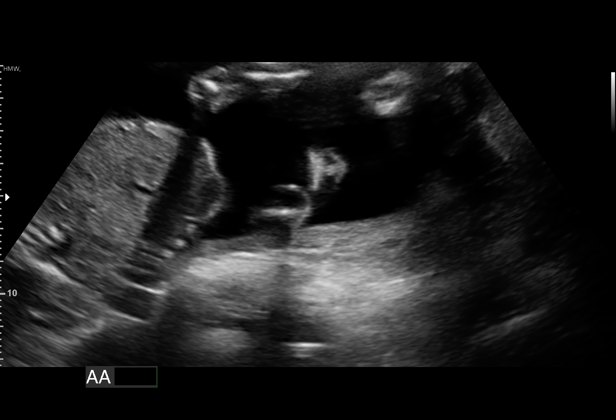
[im 10/12]
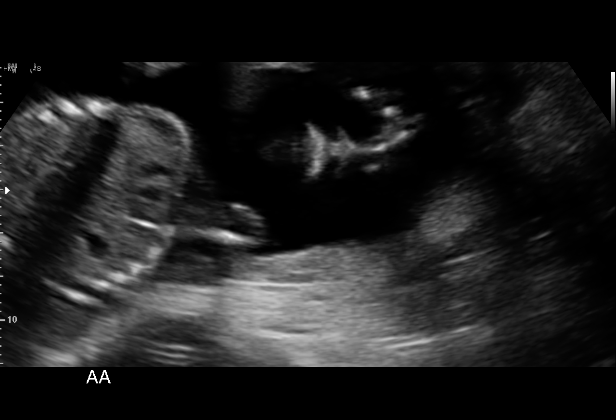
[im 11/12]
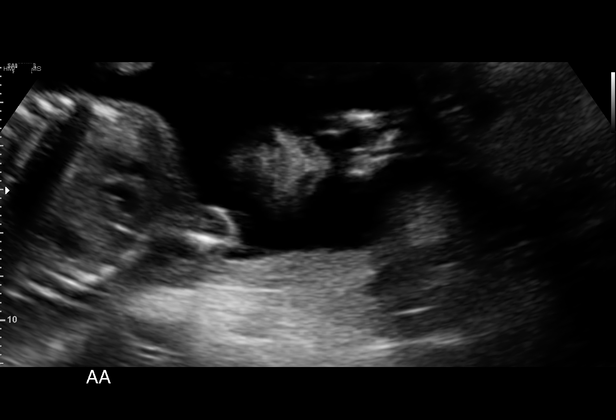
[im 12/12]
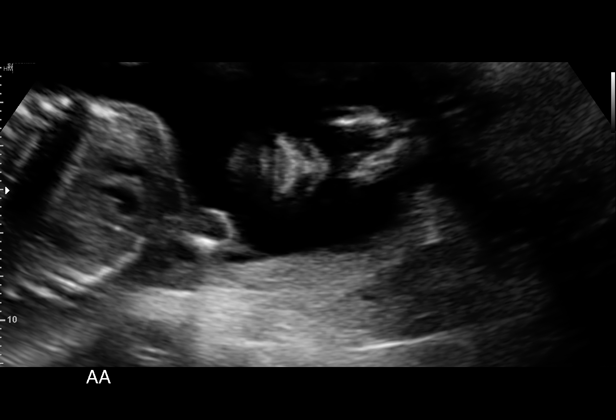

[12 of 12 positions shown; findings below may reference images not displayed]

OB/Gyn Clinic

  2  US MFM OB DETAIL ADDL GEST           76811.02     RIGNA MIKI
     +14 WK
 ----------------------------------------------------------------------

 ----------------------------------------------------------------------
Indications

  Twin pregnancy, di/di, second trimester
  Late prenatal care, second trimester
  Obesity complicating pregnancy, second
  trimester (pregravid BMI 31.74)
  Encounter for antenatal screening for
  malformations (no NIPS drawn, AFP drawn
  but results not back yet)
  Previous cesarean delivery, antepartum x 3
  21 weeks gestation of pregnancy
 ----------------------------------------------------------------------
Fetal Evaluation (Fetus A)

 Num Of Fetuses:         2
 Fetal Heart Rate(bpm):  151
 Cardiac Activity:       Observed
 Fetal Lie:              Maternal left side
 Presentation:           Cephalic
 Placenta:               Anterior
 P. Cord Insertion:      Visualized, central
 Membrane Desc:      Dividing Membrane seen - Dichorionic.

 Amniotic Fluid
 AFI FV:      Within normal limits
                             Largest Pocket(cm)

Biometry (Fetus A)

 BPD:      48.8  mm     G. Age:  20w 5d         14  %    CI:        72.77   %    70 - 86
                                                         FL/HC:      18.3   %    18.4 -
 HC:      181.9  mm     G. Age:  20w 4d          6  %    HC/AC:      1.16        1.06 -
 AC:      156.5  mm     G. Age:  20w 6d         18  %    FL/BPD:     68.0   %    71 - 87
 FL:       33.2  mm     G. Age:  20w 3d          8  %    FL/AC:      21.2   %    20 - 24
 HUM:      30.9  mm     G. Age:  20w 2d         10  %

 Est. FW:     366  gm    0 lb 13 oz      21  %     FW Discordancy        21  %
OB History

 Gravidity:    4         Term:   3        Prem:   0        SAB:   0
 TOP:          0       Ectopic:  0        Living: 3
Gestational Age (Fetus A)

 LMP:           21w 5d        Date:  11/10/17                 EDD:   08/17/18
 U/S Today:     20w 5d                                        EDD:   08/24/18
 Best:          21w 5d     Det. By:  LMP  (11/10/17)          EDD:   08/17/18
Anatomy (Fetus A)

 Cranium:               Appears normal         Aortic Arch:            Appears normal
 Cavum:                 Appears normal         Ductal Arch:            Not well visualized
 Ventricles:            Appears normal         Diaphragm:              Appears normal
 Choroid Plexus:        Appears normal         Stomach:                Appears normal, left
                                                                       sided
 Cerebellum:            Appears normal         Abdomen:                Appears normal
 Posterior Fossa:       Appears normal         Abdominal Wall:         Appears nml (cord
                                                                       insert, abd wall)
 Nuchal Fold:           Not applicable (>20    Cord Vessels:           Appears normal (3
                        wks GA)                                        vessel cord)
 Face:                  Orbits nl; profile not Kidneys:                Appear normal
                        well visualized
 Lips:                  Appears normal         Bladder:                Appears normal
 Thoracic:              Appears normal         Spine:                  Appears normal
 Heart:                 Appears normal         Upper Extremities:      Not well visualized
                        (4CH, axis, and
                        situs)
 RVOT:                  Appears normal         Lower Extremities:      Appears normal
 LVOT:                  Appears normal

 Other:  Fetus appears to be female. Heels visualized. Hands not well
         visualized.

Fetal Evaluation (Fetus B)

 Num Of Fetuses:         2
 Fetal Heart Rate(bpm):  143
 Cardiac Activity:       Observed
 Fetal Lie:              Maternal right side
 Presentation:           Cephalic
 Placenta:               Anterior
 P. Cord Insertion:      Visualized, central
 Membrane Desc:      Dividing Membrane seen - Dichorionic.
 Amniotic Fluid
 AFI FV:      Polyhydramnios

                             Largest Pocket(cm)

Biometry (Fetus B)

 BPD:      52.1  mm     G. Age:  21w 6d         52  %    CI:        74.16   %    70 - 86
                                                         FL/HC:      19.3   %    18.4 -
 HC:      192.1  mm     G. Age:  21w 4d         29  %    HC/AC:      1.11        1.06 -
 AC:      172.6  mm     G. Age:  22w 1d         59  %    FL/BPD:     71.0   %    71 - 87
 FL:         37  mm     G. Age:  21w 6d         43  %    FL/AC:      21.4   %    20 - 24
 HUM:      34.1  mm     G. Age:  21w 4d         46  %

 Est. FW:     464  gm           1 lb     51  %     FW Discordancy     0 \ 21 %
Gestational Age (Fetus B)

 LMP:           21w 5d        Date:  11/10/17                 EDD:   08/17/18
 U/S Today:     21w 6d                                        EDD:   08/16/18
 Best:          21w 5d     Det. By:  LMP  (11/10/17)          EDD:   08/17/18
Anatomy (Fetus B)

 Cranium:               Appears normal         Aortic Arch:            Appears normal
 Cavum:                 Appears normal         Ductal Arch:            Appears normal
 Ventricles:            Appears normal         Diaphragm:              Appears normal
 Choroid Plexus:        Appears normal         Stomach:                Appears normal, left
                                                                       sided
 Cerebellum:            Appears normal         Abdomen:                Appears normal
 Posterior Fossa:       Appears normal         Abdominal Wall:         Appears nml (cord
                                                                       insert, abd wall)
 Nuchal Fold:           Not applicable (>20    Cord Vessels:           Appears normal (3
                        wks GA)                                        vessel cord)
 Face:                  Appears normal         Kidneys:                Appear normal
                        (orbits and profile)
 Lips:                  Appears normal         Bladder:                Appears normal
 Thoracic:              Appears normal         Spine:                  Appears normal
 Heart:                 Appears normal         Upper Extremities:      Not well visualized
                        (4CH, axis, and
                        situs)
 RVOT:                  Appears normal         Lower Extremities:      Appears normal
 LVOT:                  Appears normal

 Other:  Fetus appears to be a male. Heels visualized. Nasal bone visualized.
         Hands not well visualized.
Cervix Uterus Adnexa

 Cervix
 Length:            3.9  cm.
 Normal appearance by transabdominal scan.

 Uterus
 No abnormality visualized.

 Left Ovary
 No adnexal mass visualized.
 Right Ovary
 No adnexal mass visualized.

 Cul De Sac
 No free fluid seen.

 Adnexa
 No abnormality visualized.
Impression

  Dichorionic-diamniotic twin pregnancy.
 Natural conception.
 Twin A: Maternal left, cephalic, anterior placenta, FEMALE
 fetus. Amniotic fluid is normal and good fetal activity is seen.
 No markers of fetal aneuploidies or fetal structural defects
 are seen. Fetal biometry is consistent with her previously-
 established dates.
 Twin B: Maternal right, cephalic, anterior placenta, MALE
 fetus. Amniotic fluid is increased and good fetal activity is
 seen. No markers of fetal aneuploidies or fetal structural
 defects are seen. Fetal biometry is consistent with her
 previously-established dates.
 Growth discordancy: 21%.
 Placenta is anterior and there is no evidence of previa or
 accreta. Patient had 3 previous cesarean deliveries.
 She had cell-free fetal DNA screening and results are
 pending.
Recommendations

 An appointment was made for her to return in 4 weeks for
 fetal growth assessment.
                 Alcaria, Mariaanjos

## 2020-07-13 IMAGING — US US MFM OB FOLLOW UP ADDL GEST
1 series · 16 of 28 positions shown · non-contrast
Comparison: none

[Series 1: us mfm ob follow up addl gest · 66 acquisitions, 16 frames shown]
[im 1/66]
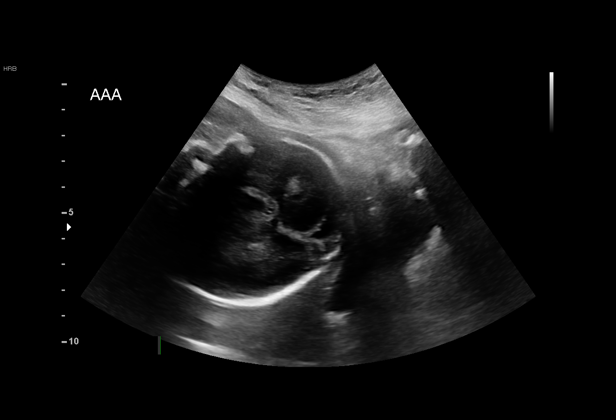
[im 5/66]
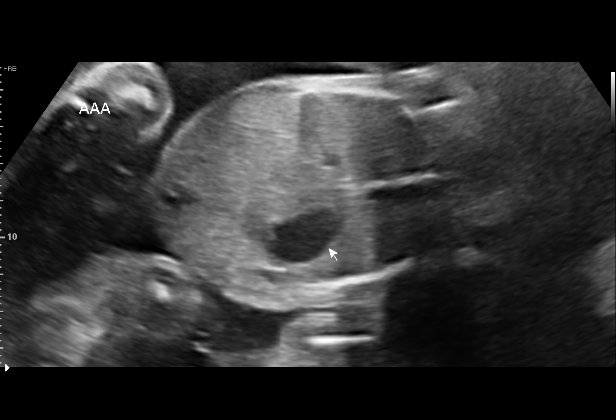
[im 10/66]
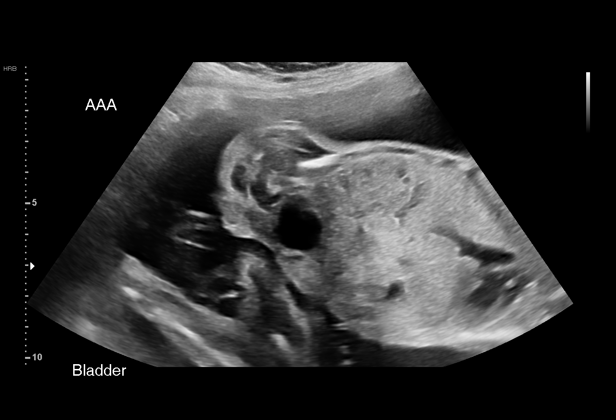
[im 15/66]
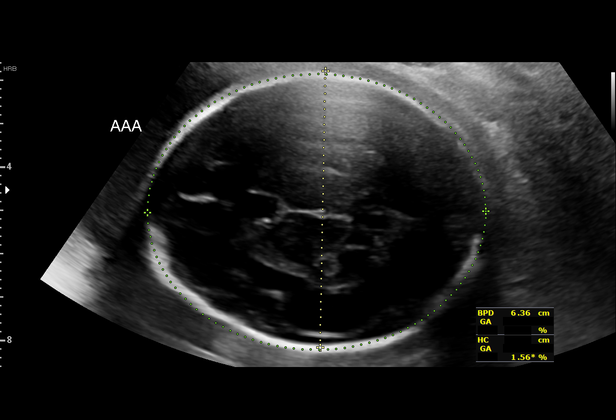
[im 17/66]
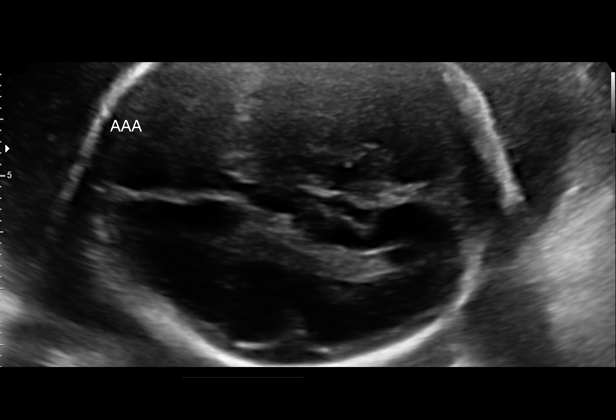
[im 22/66]
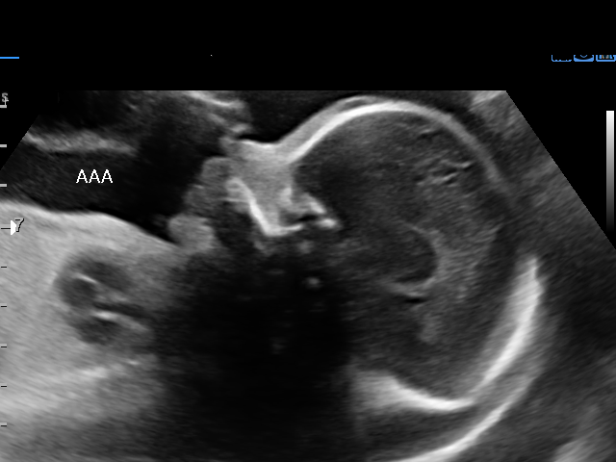
[im 27/66]
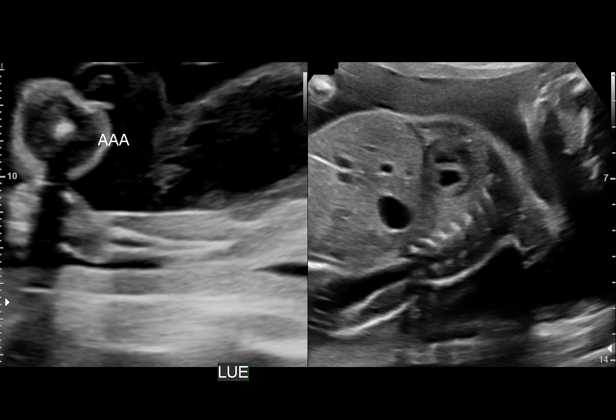
[im 32/66]
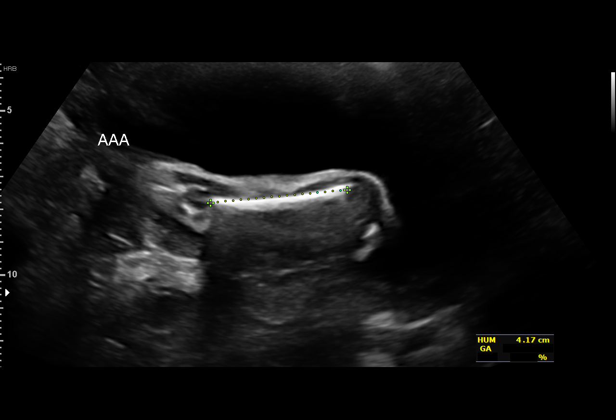
[im 34/66]
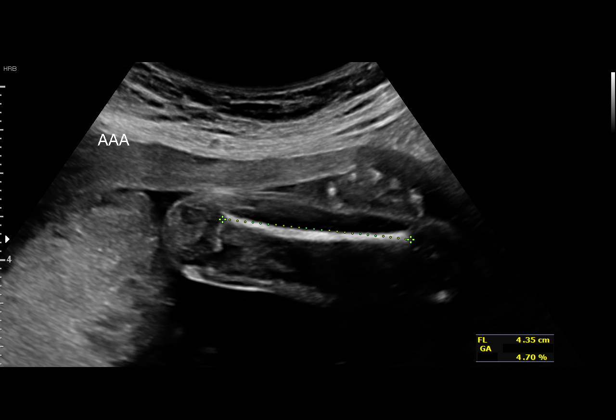
[im 39/66]
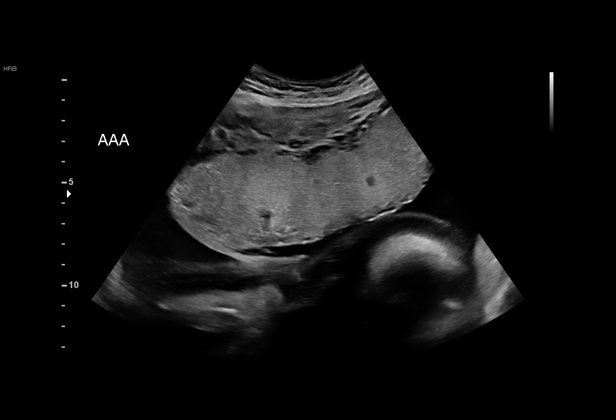
[im 44/66]
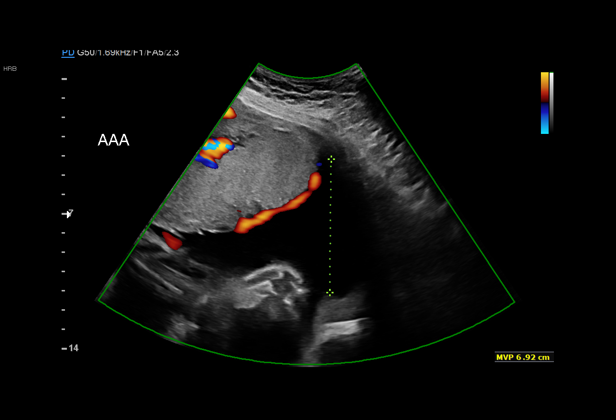
[im 49/66]
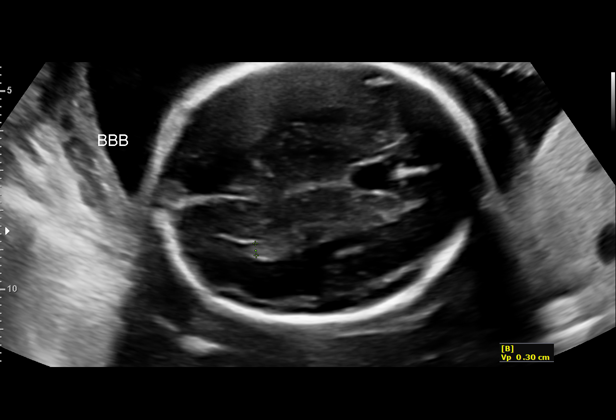
[im 51/66]
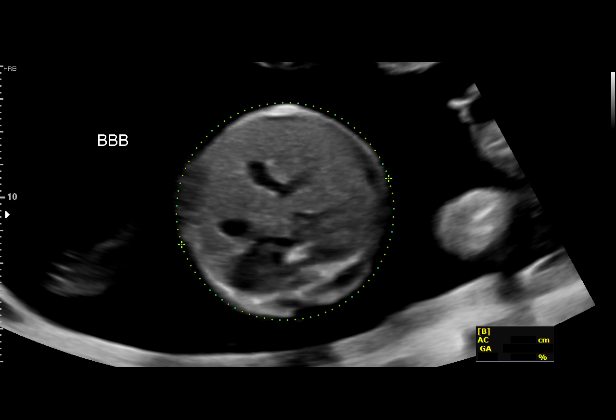
[im 56/66]
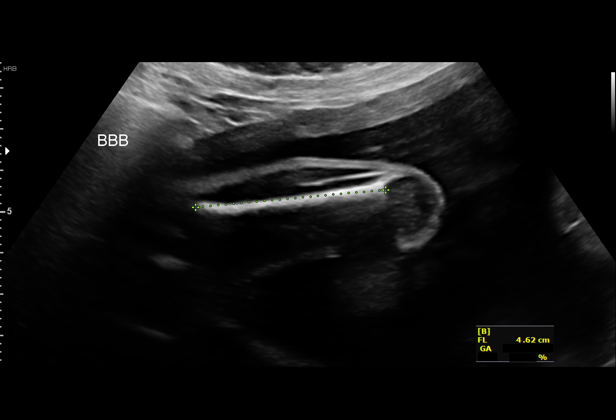
[im 61/66]
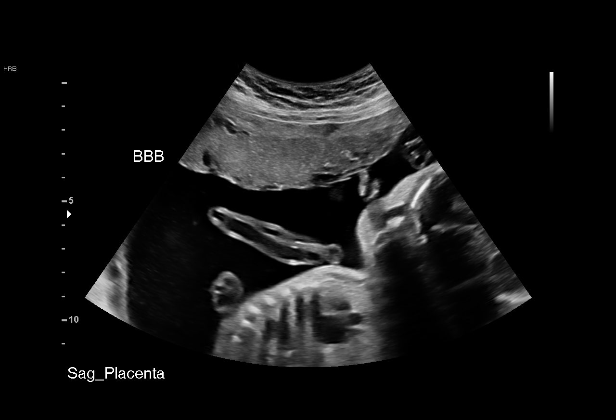
[im 66/66]
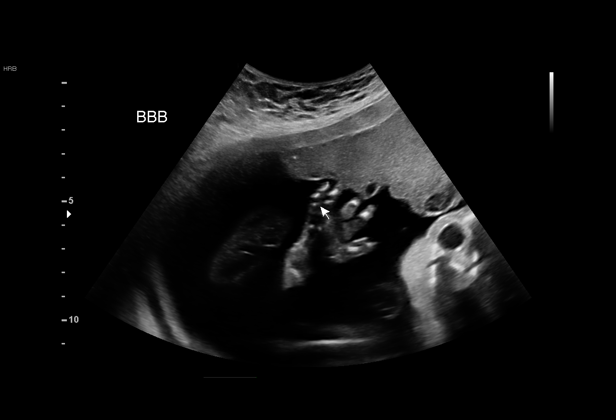

[16 of 28 positions shown; findings below may reference images not displayed]

OB/Gyn Clinic

     GEST
 ----------------------------------------------------------------------

 ----------------------------------------------------------------------
Indications

  Encounter for other antenatal screening
  follow-up  (low risk panorama, AFP negative)
  Twin pregnancy, di/di, second trimester
  Late prenatal care, second trimester
  Obesity complicating pregnancy, second
  trimester (pregravid BMI 31.74)
  Previous cesarean delivery, antepartum x 3
  25 weeks gestation of pregnancy
 ----------------------------------------------------------------------
Fetal Evaluation (Fetus A)

 Num Of Fetuses:         2
 Fetal Heart Rate(bpm):  130
 Cardiac Activity:       Observed
 Fetal Lie:              Maternal left side
 Presentation:           Cephalic
 Placenta:               Anterior
 P. Cord Insertion:      Previously Visualized
 Membrane Desc:      Dividing Membrane seen - Dichorionic.

 Amniotic Fluid
 AFI FV:      Within normal limits

                             Largest Pocket(cm)

Biometry (Fetus A)

 BPD:      63.2  mm     G. Age:  25w 4d         31  %    CI:        78.64   %    70 - 86
                                                         FL/HC:      19.5   %    18.6 -
 HC:      225.4  mm     G. Age:  24w 4d        < 3  %    HC/AC:      1.09        1.04 -
 AC:      207.2  mm     G. Age:  25w 2d         24  %    FL/BPD:     69.6   %    71 - 87
 FL:         44  mm     G. Age:  24w 4d          7  %    FL/AC:      21.2   %    20 - 24
 HUM:      41.7  mm     G. Age:  25w 1d         27  %
 LV:        3.5  mm

 Est. FW:     752  gm    1 lb 11 oz      33  %     FW Discordancy         9  %
OB History

 Gravidity:    4         Term:   3        Prem:   0        SAB:   0
 TOP:          0       Ectopic:  0        Living: 3
Gestational Age (Fetus A)

 LMP:           25w 6d        Date:  11/10/17                 EDD:   08/17/18
 U/S Today:     25w 0d                                        EDD:   08/23/18
 Best:          25w 6d     Det. By:  LMP  (11/10/17)          EDD:   08/17/18
Anatomy (Fetus A)

 Cranium:               Appears normal         Aortic Arch:            Previously seen
 Cavum:                 Appears normal         Ductal Arch:            Appears normal
 Ventricles:            Appears normal         Diaphragm:              Appears normal
 Choroid Plexus:        Previously seen        Stomach:                Appears normal, left
                                                                       sided
 Cerebellum:            Previously seen        Abdomen:                Appears normal
 Posterior Fossa:       Previously seen        Abdominal Wall:         Previously seen
 Nuchal Fold:           Not applicable (>20    Cord Vessels:           Previously seen
                        wks GA)
 Face:                  Absent nasal bone      Kidneys:                Appear normal
 Lips:                  Previously seen        Bladder:                Appears normal
 Thoracic:              Appears normal         Spine:                  Previously seen
 Heart:                 Appears normal         Upper Extremities:      Appears normal
                        (4CH, axis, and
                        situs)
 RVOT:                  Previously seen        Lower Extremities:      Previously seen
 LVOT:                  Previously seen

 Other:  Fetus appears to be female. Heels 5th digit visualized.

Fetal Evaluation (Fetus B)

 Num Of Fetuses:         2
 Fetal Heart Rate(bpm):  154
 Cardiac Activity:       Observed
 Fetal Lie:              Maternal right side
 Presentation:           Cephalic
 Placenta:               Anterior
 P. Cord Insertion:      Previously Visualized
 Membrane Desc:      Dividing Membrane seen - Dichorionic.
 Amniotic Fluid
 AFI FV:      Polyhydramnios

                             Largest Pocket(cm)

Biometry (Fetus B)

 BPD:      66.5  mm     G. Age:  26w 6d         73  %    CI:        76.21   %    70 - 86
                                                         FL/HC:      19.4   %    18.6 -
 HC:      241.4  mm     G. Age:  26w 2d         39  %    HC/AC:      1.17        1.04 -
 AC:      207.2  mm     G. Age:  25w 2d         24  %    FL/BPD:     70.4   %    71 - 87
 FL:       46.8  mm     G. Age:  25w 4d         28  %    FL/AC:      22.6   %    20 - 24
 HUM:      39.3  mm     G. Age:  24w 0d        < 5  %

 LV:          3  mm

 Est. FW:     825  gm    1 lb 13 oz      48  %     FW Discordancy      0 \ 9 %
Gestational Age (Fetus B)

 LMP:           25w 6d        Date:  11/10/17                 EDD:   08/17/18
 U/S Today:     26w 0d                                        EDD:   08/16/18
 Best:          25w 6d     Det. By:  LMP  (11/10/17)          EDD:   08/17/18
Anatomy (Fetus B)

 Cranium:               Appears normal         Aortic Arch:            Previously seen
 Cavum:                 Appears normal         Ductal Arch:            Previously seen
 Ventricles:            Appears normal         Diaphragm:              Appears normal
 Choroid Plexus:        Previously seen        Stomach:                Appears normal, left
                                                                       sided
 Cerebellum:            Previously seen        Abdomen:                Appears normal
 Posterior Fossa:       Previously seen        Abdominal Wall:         Previously seen
 Nuchal Fold:           Not applicable (>20    Cord Vessels:           Previously seen
                        wks GA)
 Face:                  Orbits and profile     Kidneys:                Previously seen
                        previously seen
 Lips:                  Previously seen        Bladder:                Appears normal
 Thoracic:              Appears normal         Spine:                  Previously seen
 Heart:                 Previously seen        Upper Extremities:      Appears normal
 RVOT:                  Previously seen        Lower Extremities:      Previously seen
 LVOT:                  Previously seen

 Other:  Fetus appears to be a male. Heels visualized. Nasal bone 5th digit
         visualized.
Cervix Uterus Adnexa

 Cervix
 Not visualized (advanced GA >57wks)
Impression

 Dichorionic-diamniotic twin pregnancy. Patient returned for
 completion of fetal anatomy. On cell-free fetal DNA
 screening, the risk for Down syndrome is not increased.
 Twin A: Maternal left, cephalic, anterior placenta. Fetal
 growth is appropriate for gestational age. Amniotic fluid is
 normal and good fetal activity is seen. Profile appears
 normal, but nasal bone is absent,
 Twin B: Maternal right, cephalic, anterior placenta. Fetal Fetal
 growth is appropriate for gestational age. Amniotic fluid is
 increased (Deepest vertical pocket is 10 cm).
 I explained the significance of the finding of absent nasal
 bone that it is a marker for Down syndrome. Nasal bone may
 also appear fetuses in African American population. I
 reassured her that cell-free fetal DNA screening has a greater
 detection of Down syndrome. Patient also understands that
 only amniocentesis will give a definitive result on the fetal
 karyotype. She opted not to have amniocentesis.
Recommendations

 -An appointment was made for her to return in 4 weeks for
 fetal growth assessment.
                 C Part, [place]

## 2020-07-16 ENCOUNTER — Ambulatory Visit: Payer: Medicaid Other | Admitting: Obstetrics

## 2020-07-27 ENCOUNTER — Ambulatory Visit: Payer: Self-pay | Admitting: *Deleted

## 2020-07-27 NOTE — Telephone Encounter (Signed)
Reason for Disposition  Numbness (i.e., loss of sensation) in foot or toes (Exception: just tingling; numbness present > 2 weeks)  Answer Assessment - Initial Assessment Questions 1. ONSET: "When did the pain start?"      yesterday 2. LOCATION: "Where is the pain located?"     Right foot 3. PAIN: "How bad is the pain?"    (Scale 1-10; or mild, moderate, severe)  - MILD (1-3): doesn't interfere with normal activities.   - MODERATE (4-7): interferes with normal activities (e.g., work or school) or awakens from sleep, limping.   - SEVERE (8-10): excruciating pain, unable to do any normal activities, unable to walk.      With walking 6/10 4. WORK OR EXERCISE: "Has there been any recent work or exercise that involved this part of the body?"      no 5. CAUSE: "What do you think is causing the foot pain?"     unsure 6. OTHER SYMPTOMS: "Do you have any other symptoms?" (e.g., leg pain, rash, fever, numbness)     Great toe completely numb.  Protocols used: Foot Pain-A-AH

## 2020-07-27 NOTE — Telephone Encounter (Signed)
Pt reports swollen right foot, onset yesterday, worse today. States great toe numb. Foot painful 6/10 with walking. Red and itchy at area top of foot, warm to touch. States "Much worse than yesterday." States possible insect bite. Advised to alert PCP or go to [facility]. Care advise per protocol. Pt verbalizes understanding.

## 2020-11-02 ENCOUNTER — Encounter (INDEPENDENT_AMBULATORY_CARE_PROVIDER_SITE_OTHER): Payer: Self-pay

## 2020-12-18 DIAGNOSIS — N939 Abnormal uterine and vaginal bleeding, unspecified: Secondary | ICD-10-CM | POA: Diagnosis not present

## 2021-02-08 DIAGNOSIS — T40601A Poisoning by unspecified narcotics, accidental (unintentional), initial encounter: Secondary | ICD-10-CM | POA: Diagnosis not present
